# Patient Record
Sex: Female | Born: 1955 | Race: Black or African American | Hispanic: No | Marital: Single | Smoking: Never smoker
Health system: Southern US, Community
[De-identification: ages and names within clinical notes are randomized; demographics above are authoritative.]

## PROBLEM LIST (undated history)

## (undated) DIAGNOSIS — I10 Essential (primary) hypertension: Secondary | ICD-10-CM

## (undated) DIAGNOSIS — Z5189 Encounter for other specified aftercare: Secondary | ICD-10-CM

## (undated) DIAGNOSIS — M549 Dorsalgia, unspecified: Secondary | ICD-10-CM

## (undated) DIAGNOSIS — T7840XA Allergy, unspecified, initial encounter: Secondary | ICD-10-CM

## (undated) DIAGNOSIS — M1990 Unspecified osteoarthritis, unspecified site: Secondary | ICD-10-CM

## (undated) DIAGNOSIS — E119 Type 2 diabetes mellitus without complications: Secondary | ICD-10-CM

## (undated) DIAGNOSIS — E7800 Pure hypercholesterolemia, unspecified: Secondary | ICD-10-CM

## (undated) HISTORY — DX: Unspecified osteoarthritis, unspecified site: M19.90

## (undated) HISTORY — DX: Allergy, unspecified, initial encounter: T78.40XA

## (undated) HISTORY — DX: Encounter for other specified aftercare: Z51.89

---

## 1999-11-04 ENCOUNTER — Other Ambulatory Visit: Admission: RE | Admit: 1999-11-04 | Discharge: 1999-11-04 | Payer: Self-pay | Admitting: Obstetrics

## 1999-11-15 ENCOUNTER — Inpatient Hospital Stay (HOSPITAL_COMMUNITY): Admission: AD | Admit: 1999-11-15 | Discharge: 1999-11-15 | Payer: Self-pay | Admitting: Obstetrics and Gynecology

## 1999-11-15 ENCOUNTER — Encounter: Payer: Self-pay | Admitting: Obstetrics

## 2000-05-14 ENCOUNTER — Encounter: Payer: Self-pay | Admitting: Obstetrics & Gynecology

## 2000-05-14 ENCOUNTER — Inpatient Hospital Stay (HOSPITAL_COMMUNITY): Admission: AD | Admit: 2000-05-14 | Discharge: 2000-05-16 | Payer: Self-pay | Admitting: Obstetrics & Gynecology

## 2000-05-14 ENCOUNTER — Encounter (INDEPENDENT_AMBULATORY_CARE_PROVIDER_SITE_OTHER): Payer: Self-pay

## 2000-11-05 ENCOUNTER — Encounter: Admission: RE | Admit: 2000-11-05 | Discharge: 2000-11-05 | Payer: Self-pay | Admitting: Internal Medicine

## 2000-11-12 ENCOUNTER — Encounter: Admission: RE | Admit: 2000-11-12 | Discharge: 2000-11-12 | Payer: Self-pay | Admitting: Internal Medicine

## 2000-12-14 ENCOUNTER — Encounter: Payer: Self-pay | Admitting: Emergency Medicine

## 2000-12-14 ENCOUNTER — Emergency Department (HOSPITAL_COMMUNITY): Admission: EM | Admit: 2000-12-14 | Discharge: 2000-12-14 | Payer: Self-pay | Admitting: Emergency Medicine

## 2001-09-09 ENCOUNTER — Emergency Department (HOSPITAL_COMMUNITY): Admission: EM | Admit: 2001-09-09 | Discharge: 2001-09-09 | Payer: Self-pay | Admitting: Emergency Medicine

## 2001-09-10 ENCOUNTER — Encounter: Payer: Self-pay | Admitting: Emergency Medicine

## 2004-12-09 ENCOUNTER — Emergency Department (HOSPITAL_COMMUNITY): Admission: EM | Admit: 2004-12-09 | Discharge: 2004-12-09 | Payer: Self-pay | Admitting: Emergency Medicine

## 2004-12-21 ENCOUNTER — Emergency Department (HOSPITAL_COMMUNITY): Admission: EM | Admit: 2004-12-21 | Discharge: 2004-12-21 | Payer: Self-pay | Admitting: Emergency Medicine

## 2006-05-03 ENCOUNTER — Emergency Department (HOSPITAL_COMMUNITY): Admission: EM | Admit: 2006-05-03 | Discharge: 2006-05-03 | Payer: Self-pay | Admitting: Emergency Medicine

## 2008-10-15 ENCOUNTER — Emergency Department (HOSPITAL_COMMUNITY): Admission: EM | Admit: 2008-10-15 | Discharge: 2008-10-15 | Payer: Self-pay | Admitting: Emergency Medicine

## 2009-12-05 ENCOUNTER — Encounter (INDEPENDENT_AMBULATORY_CARE_PROVIDER_SITE_OTHER): Payer: Self-pay | Admitting: Family Medicine

## 2009-12-05 ENCOUNTER — Ambulatory Visit: Payer: Self-pay | Admitting: Family Medicine

## 2009-12-05 ENCOUNTER — Encounter: Payer: Self-pay | Admitting: Cardiovascular Disease

## 2009-12-05 LAB — CONVERTED CEMR LAB
AST: 26 units/L (ref 0–37)
Alkaline Phosphatase: 54 units/L (ref 39–117)
BUN: 16 mg/dL (ref 6–23)
Basophils Absolute: 0 10*3/uL (ref 0.0–0.1)
Basophils Relative: 1 % (ref 0–1)
Calcium: 9.9 mg/dL (ref 8.4–10.5)
Creatinine, Ser: 0.73 mg/dL (ref 0.40–1.20)
Eosinophils Absolute: 0.1 10*3/uL (ref 0.0–0.7)
Eosinophils Relative: 3 % (ref 0–5)
Glucose, Bld: 87 mg/dL (ref 70–99)
HCT: 39.2 % (ref 36.0–46.0)
MCHC: 33.4 g/dL (ref 30.0–36.0)
MCV: 90.7 fL (ref 78.0–100.0)
Platelets: 211 10*3/uL (ref 150–400)
RDW: 15.3 % (ref 11.5–15.5)
Vit D, 25-Hydroxy: 18 ng/mL — ABNORMAL LOW (ref 30–89)

## 2009-12-23 ENCOUNTER — Ambulatory Visit: Payer: Self-pay | Admitting: Internal Medicine

## 2009-12-23 ENCOUNTER — Encounter (INDEPENDENT_AMBULATORY_CARE_PROVIDER_SITE_OTHER): Payer: Self-pay | Admitting: Family Medicine

## 2009-12-23 LAB — CONVERTED CEMR LAB
Cholesterol: 232 mg/dL — ABNORMAL HIGH (ref 0–200)
HDL: 54 mg/dL (ref >39)
LDL Cholesterol: 146 mg/dL — ABNORMAL HIGH (ref 0–99)
Triglycerides: 161 mg/dL — ABNORMAL HIGH (ref <150)
VLDL: 32 mg/dL (ref 0–40)

## 2010-01-03 ENCOUNTER — Encounter: Payer: Self-pay | Admitting: Cardiovascular Disease

## 2010-01-03 ENCOUNTER — Encounter (INDEPENDENT_AMBULATORY_CARE_PROVIDER_SITE_OTHER): Payer: Self-pay | Admitting: Family Medicine

## 2010-01-03 ENCOUNTER — Ambulatory Visit: Payer: Self-pay | Admitting: Internal Medicine

## 2010-01-03 LAB — CONVERTED CEMR LAB
AST: 31 units/L (ref 0–37)
Albumin: 4.2 g/dL (ref 3.5–5.2)
Alkaline Phosphatase: 60 units/L (ref 39–117)
BUN: 9 mg/dL (ref 6–23)
Basophils Absolute: 0 10*3/uL (ref 0.0–0.1)
Basophils Relative: 1 % (ref 0–1)
Creatinine, Ser: 0.46 mg/dL (ref 0.40–1.20)
Eosinophils Relative: 1 % (ref 0–5)
Glucose, Bld: 91 mg/dL (ref 70–99)
HCT: 38.1 % (ref 36.0–46.0)
Lymphocytes Relative: 36 % (ref 12–46)
MCHC: 33.1 g/dL (ref 30.0–36.0)
Microalb, Ur: 39.05 mg/dL — ABNORMAL HIGH (ref 0.00–1.89)
Monocytes Absolute: 0.2 10*3/uL (ref 0.1–1.0)
Platelets: 187 10*3/uL (ref 150–400)
Potassium: 3.4 meq/L — ABNORMAL LOW (ref 3.5–5.3)
RDW: 14.6 % (ref 11.5–15.5)
Total Bilirubin: 1 mg/dL (ref 0.3–1.2)

## 2010-01-06 ENCOUNTER — Ambulatory Visit: Payer: Self-pay | Admitting: Internal Medicine

## 2010-01-31 ENCOUNTER — Encounter (INDEPENDENT_AMBULATORY_CARE_PROVIDER_SITE_OTHER): Payer: Self-pay | Admitting: *Deleted

## 2010-02-20 ENCOUNTER — Ambulatory Visit: Payer: Self-pay | Admitting: Internal Medicine

## 2010-02-27 ENCOUNTER — Ambulatory Visit: Payer: Self-pay | Admitting: Cardiovascular Disease

## 2010-02-27 DIAGNOSIS — I160 Hypertensive urgency: Secondary | ICD-10-CM | POA: Insufficient documentation

## 2010-02-27 DIAGNOSIS — R9431 Abnormal electrocardiogram [ECG] [EKG]: Secondary | ICD-10-CM | POA: Insufficient documentation

## 2010-02-27 DIAGNOSIS — I10 Essential (primary) hypertension: Secondary | ICD-10-CM

## 2010-02-27 DIAGNOSIS — R079 Chest pain, unspecified: Secondary | ICD-10-CM

## 2010-02-28 ENCOUNTER — Ambulatory Visit (HOSPITAL_COMMUNITY): Admission: RE | Admit: 2010-02-28 | Discharge: 2010-02-28 | Payer: Self-pay | Admitting: Family Medicine

## 2010-03-31 ENCOUNTER — Ambulatory Visit: Payer: Self-pay

## 2010-03-31 ENCOUNTER — Ambulatory Visit (HOSPITAL_COMMUNITY): Admission: RE | Admit: 2010-03-31 | Discharge: 2010-03-31 | Payer: Self-pay | Admitting: Cardiovascular Disease

## 2010-03-31 ENCOUNTER — Encounter: Payer: Self-pay | Admitting: Cardiovascular Disease

## 2010-03-31 ENCOUNTER — Ambulatory Visit: Payer: Self-pay | Admitting: Cardiology

## 2010-07-04 ENCOUNTER — Encounter (INDEPENDENT_AMBULATORY_CARE_PROVIDER_SITE_OTHER): Payer: Self-pay | Admitting: *Deleted

## 2010-07-04 LAB — CONVERTED CEMR LAB
CO2: 27 meq/L (ref 19–32)
Calcium: 10 mg/dL (ref 8.4–10.5)
Chloride: 98 meq/L (ref 96–112)
Glucose, Bld: 83 mg/dL (ref 70–99)
Potassium: 3.7 meq/L (ref 3.5–5.3)
Sodium: 140 meq/L (ref 135–145)

## 2010-10-07 NOTE — Consult Note (Signed)
Summary: [facility]  [facility]   Imported By: Marylou Mccoy 01/27/2010 15:48:16  _____________________________________________________________________  External Attachment:    Type:   Image     Comment:   External Document

## 2010-10-07 NOTE — Assessment & Plan Note (Signed)
Summary: Np3/htn,chest pains probable lvh on ekg-mb   Visit Type:  new pt visit  CC:  chest pain about 1 month ago...no other complaints today.  History of Present Illness: Emily Dickson is seen today at [facility] for SSCP and abnormal ECG with LVH.  CRF's include HTN on Rx with diazide.  Atypical pain since spring but declined cardiology referral initially  She has had some pain/pressure fleeting in her chest most days.  Pain lasts seconds.  Not asociated with SOB, palpitations, diaphoresis or syncope.  No agravating or alleviating factors.  Not related to food or exercise.  No recent trauma.  No history of sickle cell trait, connective tissue disease or trauma  Current Problems (verified): 1)  Electrocardiogram, Abnormal  (ICD-794.31) 2)  Essential Hypertension, Benign  (ICD-401.1) 3)  Chest Pain Unspecified  (ICD-786.50)  Current Medications (verified): 1)  Triamterene-Hctz 37.5-25 Mg Tabs (Triamterene-Hctz) .Marland Kitchen.. 1 Tab Once Daily 2)  Vitamin D (Ergocalciferol) 50000 Unit Caps (Ergocalciferol) .Marland Kitchen.. 1 Tab Weekly 3)  Multivitamins   Tabs (Multiple Vitamin) .Marland Kitchen.. 1 Tab Once Daily  Allergies (verified): 1)  ! 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   Vital Signs:  Patient  profile:   55 year old female Height:      67 inches Weight:      212 pounds BMI:     33.32 Pulse rate:   70 / minute Pulse rhythm:   irregular BP sitting:   138 / 80  (left arm) Cuff size:   large  Vitals Entered By: Danielle Rankin, CMA (February 27, 2010 4:01 PM)  Physical Exam  General:  Affect appropriate Healthy:  appears stated age HEENT: normal Neck supple with no adenopathy JVP normal no bruits no thyromegaly Lungs clear with no wheezing and good diaphragmatic motion Heart:  S1/S2 no murmur,rub, gallop or click PMI normal Abdomen: benighn, BS positve, no tenderness, no AAA no bruit.  No HSM or HJR Distal pulses intact with no bruits No edema Neuro non-focal Skin warm and dry    Impression & Recommendations:  Problem # 1:  CHEST PAIN UNSPECIFIED (ICD-786.50) Atypical F/U stress echo Orders: Stress Echo (Stress Echo)  Problem # 2:  ESSENTIAL HYPERTENSION, BENIGN (ICD-401.1) Conintue diazide.  Consider adding ACE if systolic BP high with exercise Her updated medication list for this problem includes:    Triamterene-hctz 37.5-25 Mg Tabs (Triamterene-hctz) .Marland Kitchen... 1 tab once daily  Problem # 3:  ELECTROCARDIOGRAM, ABNORMAL (ICD-794.31) Borderline voltage for LVH in lateral leads.  Nonspecific finding.  Assess LVH and R/O ischemia with stress echo  Patient Instructions: 1)  Your physician recommends that you schedule a follow-up appointment in: AS NEEDED PENDING TEST RESULTS 2)  Your physician has requested that you have a stress echocardiogram. For further information please visit [email or web address]  Please follow instruction sheet as given.   EKG Report  Procedure date:  02/27/2010  Findings:      NSR 70 Borderline voltage for LVH lateral leads

## 2010-10-07 NOTE — Letter (Signed)
Summary: Appointment - Missed  [facility], Main Office  [address]   [place], [place] [address]   Phone: [phone]  Fax: [phone]     Jan 31, 2010 MRN: [id number]   Emily Dickson [address] [place], [place]  [address]   Dear [name],  Our records indicate you missed your appointment on 01/28/2010 with Dr. Eden Emms. It is very important that we reach you to reschedule this appointment. We look forward to participating in your health care needs. Please contact us at the number listed above at your earliest convenience to reschedule this appointment.     Sincerely,   Migdalia Dk [facility] Scheduling Team

## 2010-10-07 NOTE — Consult Note (Signed)
[facility]: [facility]   Imported By: Marylou Mccoy 01/27/2010 15:27:58  _____________________________________________________________________  External Attachment:    Type:   Image     Comment:   External Document

## 2010-12-16 ENCOUNTER — Other Ambulatory Visit: Payer: Self-pay | Admitting: Family Medicine

## 2011-01-23 NOTE — Discharge Summary (Signed)
[facility]  Patient:    Emily Dickson, Emily Dickson                    MRN: [id number] Adm. Date:  [id number] Disc. Date: [id number] Attending:  Antionette Char Dictator:   Gwenlyn Perking, M.D.                           Discharge Summary  ADMITTING DIAGNOSIS:          Retained products of conception.  DISCHARGE DIAGNOSIS:          Retained products of conception, resolved.  PROCEDURES:                   Dilation and curettage, ultrasound guided.  HOSPITAL COURSE:              The patient was admitted on May 14, 2000, with significant postpartum hemorrhage.  An ultrasound evaluation revealed large amount of heterogenous material in the lower uterine segment, and retained products of conception were suspected.  Given these findings, the patient was taken to the operating room secondary to postpartum hemorrhage to rule out retained products.  A D&C was performed, ultrasound guided, by Dr. Tamela Oddi.  Pathology report of the products did reveal a small piece of placenta.  After leaving the operating room, the patients hemoglobin was serially checked which came to a nadir of 4.9, at which point the patient was transfused two units of packed red blood cells.  Thereafter, a hemoglobin was again serially checked and on May 16, 2000, hemoglobin was stable at 6.6. The patient remained asymptomatic and on May 16, 2000, it was decided that the patient had benefited maximally from this hospital admission and could be discharged home safely on iron replacement 325 mg 1 p.o. t.i.d. as well as Colace.  CONDITION ON DISCHARGE:       Stable.  DISPOSITION:                  Discharged patient to home.  DISCHARGE MEDICATIONS:        1. Iron sulfate 325 mg 1 p.o. t.i.d.                               2. Colace 100 mg p.o. b.i.d.  DISCHARGE INSTRUCTIONS:       Activity as tolerated.  Diet as tolerated. Symptoms that warrant further treatment - should the  patients and/or should dizziness develop, the patient is to come back to maternity admission unit for reevaluation and treatment.  FOLLOW-UP:                    The patient is to follow-up with Dr. Tamela Oddi in her clinic in one week. DD:  05/16/00 TD:  05/17/00 Job: [id number]

## 2011-01-23 NOTE — Op Note (Signed)
[facility]  Patient:    Emily Dickson, Emily Dickson                    MRN: [id number] Proc. Date: 05/14/00 Adm. Date:  [id number] Disc. Date: [id number] Attending:  Antionette Char CC:         [facility]   Operative Report  PREOPERATIVE DIAGNOSIS:       Postpartum hemorrhage, rule out retained                               products of conception.  POSTOPERATIVE DIAGNOSIS:      Postpartum hemorrhage, rule out retained                               products of conception.  OPERATION/PROCEDURE:          Dilatation and curettage with ultrasound guidance.  SURGEON:                      Roseanna Rainbow, M.D.  ANESTHESIA:                   Paracervical block, managed anesthesia care.  COMPLICATIONS:                None.  ESTIMATED BLOOD LOSS:         Estimated blood loss was 50 cc.  FINDINGS:                     The cervix was approximately 2-3 cm dilated. There was a 2 x 2 cm fragment of placenta that was retrieved manually from the lower uterine segment and this was surrounded by a fairly large amount of clot.  The total amount of clot retrieved during the procedure was approximately 300 cc.  After sharp curettage was performed the bleeding was felt to be minimal.  Ultrasound was consistent with evacuation of the clot and retained products of conception from the lower uterine segment.  DESCRIPTION OF PROCEDURE:     The patient was taken to the operating room and placed in the dorsal lithotomy position and prepped and draped in the usual sterile fashion.  A sterile speculum was placed in the vagina and the cervix noted to be dilated with clot filling the posterior fornix.  The cervix and vagina were swabbed with Betadine as the clot was retrieved from the posterior fornix.  Lidocaine 1% 5 cc were injected into the anterior cervix.  An empty ring spongestick was then applied to this location.  Sharp curettage was  then performed.  The instruments were removed from the vagina and attempt was made to manually retrieve the retained products of conception.  The instruments were then replaced and sharp curettage performed with ultrasound guidance. After curettage minimal bleeding was noted and the empty ring spongestick was removed from the anterior lip of the cervix.  The patient tolerated the procedure well and was taken to the PACU in guarded condition.  Pathology included portion of placenta and blood clot. DD:  05/16/00 TD:  05/17/00 Job: [id number]

## 2011-01-28 ENCOUNTER — Other Ambulatory Visit (HOSPITAL_COMMUNITY): Payer: Self-pay | Admitting: Family Medicine

## 2011-01-28 DIAGNOSIS — Z1231 Encounter for screening mammogram for malignant neoplasm of breast: Secondary | ICD-10-CM

## 2011-03-02 ENCOUNTER — Ambulatory Visit (HOSPITAL_COMMUNITY): Payer: Self-pay

## 2011-03-03 ENCOUNTER — Ambulatory Visit (HOSPITAL_COMMUNITY)
Admission: RE | Admit: 2011-03-03 | Discharge: 2011-03-03 | Disposition: A | Discharge status: Home / Self Care | Payer: Self-pay | Source: Ambulatory Visit | Attending: Family Medicine | Admitting: Family Medicine

## 2011-03-03 DIAGNOSIS — Z1231 Encounter for screening mammogram for malignant neoplasm of breast: Secondary | ICD-10-CM | POA: Insufficient documentation

## 2011-12-23 ENCOUNTER — Emergency Department (INDEPENDENT_AMBULATORY_CARE_PROVIDER_SITE_OTHER)
Admission: EM | Admit: 2011-12-23 | Discharge: 2011-12-23 | Disposition: A | Discharge status: Home / Self Care | Payer: Self-pay | Source: Home / Self Care | Attending: Family Medicine | Admitting: Family Medicine

## 2011-12-23 ENCOUNTER — Emergency Department (HOSPITAL_COMMUNITY): Payer: Self-pay

## 2011-12-23 ENCOUNTER — Emergency Department (HOSPITAL_COMMUNITY)
Admission: EM | Admit: 2011-12-23 | Discharge: 2011-12-23 | Disposition: A | Discharge status: Home / Self Care | Payer: Self-pay | Attending: Emergency Medicine | Admitting: Emergency Medicine

## 2011-12-23 ENCOUNTER — Encounter (HOSPITAL_COMMUNITY): Payer: Self-pay | Admitting: Emergency Medicine

## 2011-12-23 ENCOUNTER — Encounter (HOSPITAL_COMMUNITY): Payer: Self-pay | Admitting: *Deleted

## 2011-12-23 DIAGNOSIS — R112 Nausea with vomiting, unspecified: Secondary | ICD-10-CM | POA: Insufficient documentation

## 2011-12-23 DIAGNOSIS — A09 Infectious gastroenteritis and colitis, unspecified: Secondary | ICD-10-CM

## 2011-12-23 DIAGNOSIS — Z79899 Other long term (current) drug therapy: Secondary | ICD-10-CM | POA: Insufficient documentation

## 2011-12-23 DIAGNOSIS — R1110 Vomiting, unspecified: Secondary | ICD-10-CM

## 2011-12-23 DIAGNOSIS — R109 Unspecified abdominal pain: Secondary | ICD-10-CM | POA: Insufficient documentation

## 2011-12-23 DIAGNOSIS — N390 Urinary tract infection, site not specified: Secondary | ICD-10-CM | POA: Insufficient documentation

## 2011-12-23 DIAGNOSIS — E871 Hypo-osmolality and hyponatremia: Secondary | ICD-10-CM

## 2011-12-23 DIAGNOSIS — R000 Tachycardia, unspecified: Secondary | ICD-10-CM | POA: Insufficient documentation

## 2011-12-23 DIAGNOSIS — I10 Essential (primary) hypertension: Secondary | ICD-10-CM | POA: Insufficient documentation

## 2011-12-23 DIAGNOSIS — A084 Viral intestinal infection, unspecified: Secondary | ICD-10-CM

## 2011-12-23 DIAGNOSIS — IMO0001 Reserved for inherently not codable concepts without codable children: Secondary | ICD-10-CM | POA: Insufficient documentation

## 2011-12-23 HISTORY — DX: Essential (primary) hypertension: I10

## 2011-12-23 LAB — URINE MICROSCOPIC-ADD ON

## 2011-12-23 LAB — POCT I-STAT, CHEM 8
BUN: 10 mg/dL (ref 6–23)
Calcium, Ion: 0.66 mmol/L — CL (ref 1.12–1.32)
Chloride: 94 mEq/L — ABNORMAL LOW (ref 96–112)
Creatinine, Ser: 0.9 mg/dL (ref 0.50–1.10)
Glucose, Bld: 177 mg/dL — ABNORMAL HIGH (ref 70–99)
HCT: 47 % — ABNORMAL HIGH (ref 36.0–46.0)
Potassium: 3.1 mEq/L — ABNORMAL LOW (ref 3.5–5.1)

## 2011-12-23 LAB — HEPATIC FUNCTION PANEL
ALT: 41 U/L — ABNORMAL HIGH (ref 0–35)
AST: 67 U/L — ABNORMAL HIGH (ref 0–37)
Albumin: 2.5 g/dL — ABNORMAL LOW (ref 3.5–5.2)
Bilirubin, Direct: 0.3 mg/dL (ref 0.0–0.3)
Total Bilirubin: 1.1 mg/dL (ref 0.3–1.2)

## 2011-12-23 LAB — CBC
Hemoglobin: 14.6 g/dL (ref 12.0–15.0)
MCH: 31.3 pg (ref 26.0–34.0)
MCHC: 36 g/dL (ref 30.0–36.0)
MCV: 86.7 fL (ref 78.0–100.0)
Platelets: 101 10*3/uL — ABNORMAL LOW (ref 150–400)
RBC: 4.67 MIL/uL (ref 3.87–5.11)

## 2011-12-23 LAB — DIFFERENTIAL
Basophils Relative: 0 % (ref 0–1)
Eosinophils Absolute: 0 10*3/uL (ref 0.0–0.7)
Eosinophils Relative: 0 % (ref 0–5)
Lymphs Abs: 0.8 10*3/uL (ref 0.7–4.0)
Monocytes Relative: 4 % (ref 3–12)

## 2011-12-23 LAB — URINALYSIS, ROUTINE W REFLEX MICROSCOPIC
Glucose, UA: 100 mg/dL — AB
Hgb urine dipstick: NEGATIVE
Ketones, ur: 15 mg/dL — AB
Protein, ur: 100 mg/dL — AB
Urobilinogen, UA: 1 mg/dL (ref 0.0–1.0)

## 2011-12-23 MED ORDER — ONDANSETRON HCL 4 MG PO TABS: 4.0000 mg | ORAL_TABLET | Freq: Four times a day (QID) | ORAL | Status: DC

## 2011-12-23 MED ORDER — ALBUTEROL SULFATE (5 MG/ML) 0.5% IN NEBU
INHALATION_SOLUTION | RESPIRATORY_TRACT | Status: AC
  Filled 2011-12-23: qty 1

## 2011-12-23 MED ORDER — ACETAMINOPHEN 325 MG PO TABS
ORAL_TABLET | ORAL | Status: AC
  Administered 2011-12-23: 650 mg
  Filled 2011-12-23: qty 2

## 2011-12-23 MED ORDER — ONDANSETRON HCL 4 MG/2ML IJ SOLN
4.0000 mg | Freq: Once | INTRAMUSCULAR | Status: AC
  Administered 2011-12-23: 4 mg via INTRAVENOUS
  Filled 2011-12-23: qty 2

## 2011-12-23 MED ORDER — SODIUM CHLORIDE 0.9 % IV BOLUS (SEPSIS)
1000.0000 mL | Freq: Once | INTRAVENOUS | Status: AC
  Administered 2011-12-23: 1000 mL via INTRAVENOUS

## 2011-12-23 MED ORDER — SULFAMETHOXAZOLE-TMP DS 800-160 MG PO TABS
1.0000 | ORAL_TABLET | Freq: Once | ORAL | Status: AC
  Administered 2011-12-23: 1 via ORAL
  Filled 2011-12-23: qty 1

## 2011-12-23 MED ORDER — ONDANSETRON 4 MG PO TBDP
ORAL_TABLET | ORAL | Status: AC
  Filled 2011-12-23: qty 1

## 2011-12-23 MED ORDER — SULFAMETHOXAZOLE-TRIMETHOPRIM 800-160 MG PO TABS: 1.0000 | ORAL_TABLET | Freq: Two times a day (BID) | ORAL | Status: DC

## 2011-12-23 MED ORDER — ONDANSETRON 4 MG PO TBDP
4.0000 mg | ORAL_TABLET | Freq: Once | ORAL | Status: AC
  Administered 2011-12-23: 4 mg via ORAL

## 2011-12-23 MED ORDER — METOCLOPRAMIDE HCL 10 MG PO TABS: 10.0000 mg | ORAL_TABLET | Freq: Four times a day (QID) | ORAL | Status: DC

## 2011-12-23 MED ORDER — HYDROMORPHONE HCL PF 1 MG/ML IJ SOLN
0.5000 mg | Freq: Once | INTRAMUSCULAR | Status: AC
  Administered 2011-12-23: 0.5 mg via INTRAVENOUS
  Filled 2011-12-23: qty 1

## 2011-12-23 NOTE — ED Notes (Signed)
To ED for eval of abnormal labs resulted by ucc. Pt states she has been vomiting since yesterday. Potassium 3.1, sodium 128,  and glucose 177. Pt states she feels weak. No cp. No sob. No vomiting at present

## 2011-12-23 NOTE — Discharge Instructions (Signed)
FOLLOW UP WITH DR. SHAW FOR RECHECK IF SYMPTOMS PERSIST. RETURN HERE WITH ANY HIGH FEVER, SEVERE PAIN OR NEW CONCERN. TAKE SEPTRA FOR URINARY TRACT INFECTION AND REGLAN FOR NAUSEA IF NEEDED. PUSH FLUIDS.   Urinary Tract Infection Infections of the urinary tract can start in several places. A bladder infection (cystitis), a kidney infection (pyelonephritis), and a prostate infection (prostatitis) are different types of urinary tract infections (UTIs). They usually get better if treated with medicines (antibiotics) that kill germs. Take all the medicine until it is gone. You or your child may feel better in a few days, but TAKE ALL MEDICINE or the infection may not respond and may become more difficult to treat. HOME CARE INSTRUCTIONS   Drink enough water and fluids to keep the urine clear or pale yellow. Cranberry juice is especially recommended, in addition to large amounts of water.   Avoid caffeine, tea, and carbonated beverages. They tend to irritate the bladder.   Alcohol may irritate the prostate.   Only take over-the-counter or prescription medicines for pain, discomfort, or fever as directed by your caregiver.  To prevent further infections:  Empty the bladder often. Avoid holding urine for long periods of time.   After a bowel movement, women should cleanse from front to back. Use each tissue only once.   Empty the bladder before and after sexual intercourse.  FINDING OUT THE RESULTS OF YOUR TEST Not all test results are available during your visit. If your or your child's test results are not back during the visit, make an appointment with your caregiver to find out the results. Do not assume everything is normal if you have not heard from your caregiver or the medical facility. It is important for you to follow up on all test results. SEEK MEDICAL CARE IF:   There is back pain.   Your baby is older than 3 months with a rectal temperature of 100.5 F (38.1 C) or higher for more  than 1 day.   Your or your child's problems (symptoms) are no better in 3 days. Return sooner if you or your child is getting worse.  SEEK IMMEDIATE MEDICAL CARE IF:   There is severe back pain or lower abdominal pain.   You or your child develops chills.   You have a fever.   Your baby is older than 3 months with a rectal temperature of 102 F (38.9 C) or higher.   Your baby is 15 months old or younger with a rectal temperature of 100.4 F (38 C) or higher.   There is nausea or vomiting.   There is continued burning or discomfort with urination.  MAKE SURE YOU:   Understand these instructions.   Will watch your condition.   Will get help right away if you are not doing well or get worse.  Document Released: 06/03/2005 Document Revised: 08/13/2011 Document Reviewed: 01/06/2007 [facility] Patient Information 2012 [place], [place].

## 2011-12-23 NOTE — ED Notes (Signed)
States, "feels better, ready to go, I am hungry".

## 2011-12-23 NOTE — ED Notes (Signed)
Pt to xray, alert, NAD, calm, interactive

## 2011-12-23 NOTE — ED Provider Notes (Signed)
History     CSN: [id number]  Arrival date & time 12/23/11  1805   First MD Initiated Contact with Patient 12/23/11 1910      Chief Complaint  Patient presents with  . Abnormal Lab    (Consider location/radiation/quality/duration/timing/severity/associated sxs/prior treatment) Patient is a 56 y.o. female presenting with vomiting. The history is provided by the patient and a relative.  Emesis  This is a new problem. The current episode started 2 days ago. Episode frequency: She vomiting twice yesterday and has not vomited today. Nausea is persistent. The problem has been gradually worsening. There has been no fever. Associated symptoms include abdominal pain and myalgias. Pertinent negatives include no chills, no diarrhea and no fever.    Past Medical History  Diagnosis Date  . Hypertension     History reviewed. No pertinent past surgical history.  History reviewed. No pertinent family history.  History  Substance Use Topics  . Smoking status: Never Smoker   . Smokeless tobacco: Not on file  . Alcohol Use: No    OB History    Grav Para Term Preterm Abortions TAB SAB Ect Mult Living                  Review of Systems  Constitutional: Negative for fever and chills.  Respiratory: Negative.   Cardiovascular: Negative.   Gastrointestinal: Positive for nausea, vomiting and abdominal pain. Negative for diarrhea and blood in stool.  Genitourinary: Negative for dysuria.  Musculoskeletal: Positive for myalgias.  Skin: Negative.  Negative for rash.  Neurological: Negative.  Negative for dizziness and light-headedness.    Allergies  Penicillins  Home Medications   Current Outpatient Rx  Name Route Sig Dispense Refill  . HYDROCHLOROTHIAZIDE 25 MG PO TABS Oral Take 25 mg by mouth daily.      BP 107/73  Pulse 118  Temp(Src) 99.3 F (37.4 C) (Oral)  Resp 16  SpO2 100%  Physical Exam  Constitutional: She appears well-developed and well-nourished.  HENT:  Head:  Normocephalic.  Neck: Normal range of motion. Neck supple.  Cardiovascular: Regular rhythm.  Tachycardia present.   Pulmonary/Chest: Effort normal and breath sounds normal.  Abdominal: Soft. Bowel sounds are normal. There is tenderness. There is no rebound and no guarding.       Tenderness is diffuse.   Musculoskeletal: Normal range of motion. She exhibits no edema.  Neurological: She is alert.  Skin: Skin is warm and dry. No rash noted.  Psychiatric: She has a normal mood and affect.    ED Course  Procedures (including critical care time)   Results for orders placed during the hospital encounter of 12/23/11  HEPATIC FUNCTION PANEL      Component Value Range   Total Protein 6.9  6.0 - 8.3 (g/dL)   Albumin 2.5 (*) 3.5 - 5.2 (g/dL)   AST 67 (*) 0 - 37 (U/L)   ALT 41 (*) 0 - 35 (U/L)   Alkaline Phosphatase 77  39 - 117 (U/L)   Total Bilirubin 1.1  0.3 - 1.2 (mg/dL)   Bilirubin, Direct 0.3  0.0 - 0.3 (mg/dL)   Indirect Bilirubin 0.8  0.3 - 0.9 (mg/dL)  CBC      Component Value Range   WBC 6.5  4.0 - 10.5 (K/uL)   RBC 4.67  3.87 - 5.11 (MIL/uL)   Hemoglobin 14.6  12.0 - 15.0 (g/dL)   HCT 10.2  72.5 - 36.6 (%)   MCV 86.7  78.0 - 100.0 (fL)  MCH 31.3  26.0 - 34.0 (pg)   MCHC 36.0  30.0 - 36.0 (g/dL)   RDW 16.1 (*) 09.6 - 15.5 (%)   Platelets 101 (*) 150 - 400 (K/uL)  DIFFERENTIAL      Component Value Range   Neutrophils Relative 83 (*) 43 - 77 (%)   Neutro Abs 5.4  1.7 - 7.7 (K/uL)   Lymphocytes Relative 13  12 - 46 (%)   Lymphs Abs 0.8  0.7 - 4.0 (K/uL)   Monocytes Relative 4  3 - 12 (%)   Monocytes Absolute 0.3  0.1 - 1.0 (K/uL)   Eosinophils Relative 0  0 - 5 (%)   Eosinophils Absolute 0.0  0.0 - 0.7 (K/uL)   Basophils Relative 0  0 - 1 (%)   Basophils Absolute 0.0  0.0 - 0.1 (K/uL)  URINALYSIS, ROUTINE W REFLEX MICROSCOPIC      Component Value Range   Color, Urine AMBER (*) YELLOW    APPearance TURBID (*) CLEAR    Specific Gravity, Urine 1.028  1.005 - 1.030    pH  6.0  5.0 - 8.0    Glucose, UA 100 (*) NEGATIVE (mg/dL)   Hgb urine dipstick NEGATIVE  NEGATIVE    Bilirubin Urine SMALL (*) NEGATIVE    Ketones, ur 15 (*) NEGATIVE (mg/dL)   Protein, ur 045 (*) NEGATIVE (mg/dL)   Urobilinogen, UA 1.0  0.0 - 1.0 (mg/dL)   Nitrite POSITIVE (*) NEGATIVE    Leukocytes, UA SMALL (*) NEGATIVE   URINE MICROSCOPIC-ADD ON      Component Value Range   Squamous Epithelial / LPF FEW (*) RARE    WBC, UA 3-6  <3 (WBC/hpf)   Bacteria, UA FEW (*) RARE    Casts HYALINE CASTS (*) NEGATIVE    Urine-Other MUCOUS PRESENT      Dg Abd Acute W/chest  12/23/2011  *RADIOLOGY REPORT*  Clinical Data: Abdominal pain, vomiting, diarrhea no evidence of acute abnormality.  ACUTE ABDOMEN SERIES (ABDOMEN 2 VIEW & CHEST 1 VIEW)  Comparison:  Chest x-ray dated October 15, 2008   Findings:  The cardiac silhouette, mediastinum, pulmonary vasculature are within normal limits.  Both lungs are clear.  The stool and bowel gas pattern is within normal limits with no evidence of obstruction.  No pneumoperitoneum.  There is no gross organomegaly.  No abnormal calcifications are seen over the abdomen or pelvis.  Degenerative changes are present within the axial spine. There is no acute bony abnormality.  IMPRESSION: There is no evidence of acute abnormality.  Original Report Authenticated By: Brandon Melnick, M.D.     No diagnosis found.  1. UTI   MDM  She is feeling better after IV fluids. Tachycardia resolved. She has urine that is nitrite positive and will treat. No abdominal pain on re-evaluation. Feel she can be discharged home.        Rodena Medin, PA-C 12/23/11 2150

## 2011-12-23 NOTE — ED Notes (Addendum)
Pt complains of fingers tingling, cramping, stiffening, and hurting. No chest pain or SOB. Her feet and stomach are hurting, too. Medical history of HTN. Allergic to Penicillin. Abdominal pain all over. Vomited three times last night with no diarrhea. She hasn't been eating much for 2 days. She takes blood pressure medication. Facial discoloration around her lips and nose that started a week ago. Skin is pink from scratching.

## 2011-12-23 NOTE — ED Notes (Signed)
Pt c/o vomiting last night that has improved today. She has nausea and new onset of numbness in hands this morning. She also has a new numbness in her feet.

## 2011-12-23 NOTE — ED Provider Notes (Signed)
Medical screening examination/treatment/procedure(s) were performed by non-physician practitioner and as supervising physician I was immediately available for consultation/collaboration.   Loren Racer, MD 12/23/11 2249

## 2011-12-23 NOTE — ED Provider Notes (Signed)
History     CSN: [id number]  Arrival date & time 12/23/11  1531   First MD Initiated Contact with Patient 12/23/11 1612      Chief Complaint  Patient presents with  . Numbness    (Consider location/radiation/quality/duration/timing/severity/associated sxs/prior treatment) Patient is a 56 y.o. female presenting with vomiting. The history is provided by the patient.  Emesis  This is a new problem. The current episode started yesterday (felt fine yest until onset of nausea then v/d last eve.). The problem occurs 2 to 4 times per day. The problem has been gradually improving (no vomiting today.has noticed cramping and tingling in hands and feet since onset.). The emesis has an appearance of stomach contents. There has been no fever. Associated symptoms include abdominal pain and diarrhea. Pertinent negatives include no chills, no cough, no fever, no headaches and no URI.    Past Medical History  Diagnosis Date  . Hypertension     History reviewed. No pertinent past surgical history.  History reviewed. No pertinent family history.  History  Substance Use Topics  . Smoking status: Never Smoker   . Smokeless tobacco: Not on file  . Alcohol Use: No    OB History    Grav Para Term Preterm Abortions TAB SAB Ect Mult Living                  Review of Systems  Constitutional: Negative for fever, chills and appetite change.  HENT: Negative.   Respiratory: Negative for cough.   Gastrointestinal: Positive for nausea, vomiting, abdominal pain and diarrhea. Negative for blood in stool and abdominal distention.  Genitourinary: Negative.   Musculoskeletal: Negative for back pain.  Neurological: Positive for weakness and numbness. Negative for headaches.    Allergies  Penicillins  Home Medications   Current Outpatient Rx  Name Route Sig Dispense Refill  . HYDROCHLOROTHIAZIDE 25 MG PO TABS Oral Take 25 mg by mouth daily.    Marland Kitchen ONDANSETRON HCL 4 MG PO TABS Oral Take 1 tablet (4  mg total) by mouth every 6 (six) hours. As needed for n/v 6 tablet 0    BP 113/77  Pulse 118  Temp(Src) 99 F (37.2 C) (Oral)  Resp 18  SpO2 100%  Physical Exam  Nursing note and vitals reviewed. Constitutional: She is oriented to person, place, and time. She appears well-developed and well-nourished.  HENT:  Head: Normocephalic.  Right Ear: External ear normal.  Left Ear: External ear normal.  Nose: Nose normal.  Mouth/Throat: Oropharynx is clear and moist.  Eyes: Pupils are equal, round, and reactive to light.  Neck: Normal range of motion. Neck supple.  Cardiovascular: Normal rate, normal heart sounds and intact distal pulses.   Pulmonary/Chest: Effort normal and breath sounds normal.  Abdominal: Soft. Normal appearance. She exhibits no distension and no mass. Bowel sounds are decreased. There is no hepatosplenomegaly. There is tenderness in the epigastric area. There is no rigidity, no rebound, no guarding and no CVA tenderness. No hernia.  Musculoskeletal: She exhibits no edema and no tenderness.  Lymphadenopathy:    She has no cervical adenopathy.  Neurological: She is alert and oriented to person, place, and time.  Skin: Skin is warm and dry.    ED Course  Procedures (including critical care time)  Labs Reviewed  POCT I-STAT, CHEM 8 - Abnormal; Notable for the following:    Sodium 128 (*)    Potassium 3.1 (*)    Chloride 94 (*)    Glucose, Bld  177 (*)    Calcium, Ion 0.66 (*)    Hemoglobin 16.0 (*)    HCT 47.0 (*)    All other components within normal limits   No results found.   1. Gastroenteritis and colitis, viral   2. Vomiting   3. Hyponatremia   4. Hypocalcemia       MDM  abnl i- stat        Linna Hoff, MD 12/23/11 [phone]

## 2011-12-23 NOTE — ED Notes (Signed)
Patient transported to CT 

## 2011-12-23 NOTE — ED Notes (Signed)
Pt comes from [facility], c/o nausea, vomiting. Pt given zofran at [facility]. Sent here for evaluation due to potassium 3.1, glucose 177, Sodium 128. Pt reporting new onset numbness in feet and hands. No neuro deficits noted per Wilkie Aye, Charity fundraiser.Pt ambulated into ER independently.

## 2011-12-29 ENCOUNTER — Emergency Department (HOSPITAL_COMMUNITY)
Admission: EM | Admit: 2011-12-29 | Discharge: 2011-12-29 | Disposition: A | Discharge status: Home / Self Care | Payer: Self-pay | Attending: Emergency Medicine | Admitting: Emergency Medicine

## 2011-12-29 ENCOUNTER — Encounter (HOSPITAL_COMMUNITY): Payer: Self-pay | Admitting: *Deleted

## 2011-12-29 DIAGNOSIS — R197 Diarrhea, unspecified: Secondary | ICD-10-CM | POA: Insufficient documentation

## 2011-12-29 DIAGNOSIS — R5381 Other malaise: Secondary | ICD-10-CM | POA: Insufficient documentation

## 2011-12-29 DIAGNOSIS — E876 Hypokalemia: Secondary | ICD-10-CM | POA: Insufficient documentation

## 2011-12-29 DIAGNOSIS — R112 Nausea with vomiting, unspecified: Secondary | ICD-10-CM | POA: Insufficient documentation

## 2011-12-29 DIAGNOSIS — Z79899 Other long term (current) drug therapy: Secondary | ICD-10-CM | POA: Insufficient documentation

## 2011-12-29 DIAGNOSIS — I10 Essential (primary) hypertension: Secondary | ICD-10-CM | POA: Insufficient documentation

## 2011-12-29 LAB — CBC
HCT: 32.3 % — ABNORMAL LOW (ref 36.0–46.0)
MCH: 30.4 pg (ref 26.0–34.0)
MCHC: 34.7 g/dL (ref 30.0–36.0)
MCV: 87.8 fL (ref 78.0–100.0)
Platelets: 306 10*3/uL (ref 150–400)
RDW: 16 % — ABNORMAL HIGH (ref 11.5–15.5)
WBC: 6.3 10*3/uL (ref 4.0–10.5)

## 2011-12-29 LAB — BASIC METABOLIC PANEL
CO2: 31 mEq/L (ref 19–32)
Calcium: 10 mg/dL (ref 8.4–10.5)
GFR calc non Af Amer: >90 mL/min (ref >90)
Glucose, Bld: 142 mg/dL — ABNORMAL HIGH (ref 70–99)
Potassium: 2.2 mEq/L — CL (ref 3.5–5.1)
Sodium: 132 mEq/L — ABNORMAL LOW (ref 135–145)

## 2011-12-29 LAB — URINALYSIS, ROUTINE W REFLEX MICROSCOPIC
Glucose, UA: NEGATIVE mg/dL
Hgb urine dipstick: NEGATIVE
Leukocytes, UA: NEGATIVE
Protein, ur: NEGATIVE mg/dL
Specific Gravity, Urine: 1.003 — ABNORMAL LOW (ref 1.005–1.030)
Urobilinogen, UA: 0.2 mg/dL (ref 0.0–1.0)

## 2011-12-29 MED ORDER — SODIUM CHLORIDE 0.9 % IV SOLN
Freq: Once | INTRAVENOUS | Status: AC
  Administered 2011-12-29: 19:00:00 via INTRAVENOUS

## 2011-12-29 MED ORDER — SODIUM CHLORIDE 0.9 % IV BOLUS (SEPSIS)
1000.0000 mL | Freq: Once | INTRAVENOUS | Status: AC
  Administered 2011-12-29: 1000 mL via INTRAVENOUS

## 2011-12-29 MED ORDER — POTASSIUM CHLORIDE 10 MEQ/100ML IV SOLN
10.0000 meq | INTRAVENOUS | Status: AC
  Administered 2011-12-29 (×3): 10 meq via INTRAVENOUS
  Filled 2011-12-29 (×3): qty 100

## 2011-12-29 NOTE — Discharge Instructions (Signed)
Please read the information below.  Your potassium level was 2.2 today.  Take the potassium prescribed to you as directed.  Call your primary care provider for a close follow up appointment.  Read the information below and make sure you eat a healthy, balanced diet including foods rich in potassium.  You may return to the ER at any time for worsening condition or any new symptoms that concern you.     Hypokalemia Hypokalemia means a low potassium level in the blood.Potassium is an electrolyte that helps regulate the amount of fluid in the body. It also stimulates muscle contraction and maintains a stable acid-base balance.Most of the body's potassium is inside of cells, and only a very small amount is in the blood. Because the amount in the blood is so small, minor changes can have big effects. PREPARATION FOR TEST Testing for potassium requires taking a blood sample taken by needle from a vein in the arm. The skin is cleaned thoroughly before the sample is drawn. There is no other special preparation needed. NORMAL VALUES Potassium levels below 3.5 mEq/L are abnormally low. Levels above 5.1 mEq/L are abnormally high. Ranges for normal findings may vary among different laboratories and hospitals. You should always check with your doctor after having lab work or other tests done to discuss the meaning of your test results and whether your values are considered within normal limits. MEANING OF TEST  Your caregiver will go over the test results with you and discuss the importance and meaning of your results, as well as treatment options and the need for additional tests, if necessary. A potassium level is frequently part of a routine medical exam. It is usually included as part of a whole "panel" of tests for several blood salts (such as Sodium and Chloride). It may be done as part of follow-up when a low potassium level was found in the past or other blood salts are suspected of being out of balance. A  low potassium level might be suspected if you have one or more of the following:  Symptoms of weakness.   Abnormal heart rhythms.   High blood pressure and are taking medication to control this, especially water pills (diuretics).   Kidney disease that can affect your potassium level .   Diabetes requiring the use of insulin. The potassium may fall after taking insulin, especially if the diabetes had been out of control for a while.   A condition requiring the use of cortisone-type medication or certain types of antibiotics.   Vomiting and/or diarrhea for more than a day or two.   A stomach or intestinal condition that may not permit appropriate absorption of potassium.   Fainting episodes.   Mental confusion.  OBTAINING TEST RESULTS It is your responsibility to obtain your test results. Ask the lab or department performing the test when and how you will get your results.  Please contact your caregiver directly if you have not received the results within one week. At that time, ask if there is anything different or new you should be doing in relation to the results. TREATMENT Hypokalemia can be treated with potassium supplements taken by mouth and/or adjustments in your current medications. A diet high in potassium is also helpful. Foods with high potassium content are:  Peas, lentils, lima beans, nuts, and dried fruit.   Whole grain and bran cereals and breads.   Fresh fruit, vegetables (bananas, cantaloupe, grapefruit, oranges, tomatoes, honeydew melons, potatoes).   Orange and tomato juices.  Meats. If potassium supplement has been prescribed for you today or your medications have been adjusted, see your personal caregiver in time02 for a re-check.  SEEK MEDICAL CARE IF:  There is a feeling of worsening weakness.   You experience repeated chest palpitations.   You are diabetic and having difficulty keeping your blood sugars in the normal range.   You are  experiencing vomiting and/or diarrhea.   You are having difficulty with any of your regular medications.  SEEK IMMEDIATE MEDICAL CARE IF:  You experience chest pain, shortness of breath, or episodes of dizziness.   You have been having vomiting or diarrhea for more than 2 days.   You have a fainting episode.  MAKE SURE YOU:   Understand these instructions.   Will watch your condition.   Will get help right away if you are not doing well or get worse.  Document Released: 08/24/2005 Document Revised: 08/13/2011 Document Reviewed: 08/04/2008 [facility] Patient Information 2012 [place], [place].  Foods Rich in Potassium Food / Potassium (mg)  Apricots, dried,  cup / 378 mg   Apricots, raw, 1 cup halves / 401 mg   Avocado,  / 487 mg   Banana, 1 large / 487 mg   Beef, lean, round, 3 oz / 202 mg   Cantaloupe, 1 cup cubes / 427 mg   Dates, medjool, 5 whole / 835 mg   Ham, cured, 3 oz / 212 mg   Lentils, dried,  cup / 458 mg   Lima beans, frozen,  cup / 258 mg   Orange, 1 large / 333 mg   Orange juice, 1 cup / 443 mg   Peaches, dried,  cup / 398 mg   Peas, split, cooked,  cup / 355 mg   Potato, boiled, 1 medium / 515 mg   Prunes, dried, uncooked,  cup / 318 mg   Raisins,  cup / 309 mg   Salmon, pink, raw, 3 oz / 275 mg   Sardines, canned , 3 oz / 338 mg   Tomato, raw, 1 medium / 292 mg   Tomato juice, 6 oz / 417 mg   [place], 3 oz / 349 mg  Document Released: 08/24/2005 Document Revised: 05/06/2011 Document Reviewed: 01/07/2009 [facility] Patient Information 2012 [place], [place].Foods Rich in Potassium Food / Potassium (mg)  Apricots, dried,  cup / 378 mg   Apricots, raw, 1 cup halves / 401 mg   Avocado,  / 487 mg   Banana, 1 large / 487 mg   Beef, lean, round, 3 oz / 202 mg   Cantaloupe, 1 cup cubes / 427 mg   Dates, medjool, 5 whole / 835 mg   Ham, cured, 3 oz / 212 mg   Lentils, dried,  cup / 458 mg   Lima beans, frozen,  cup /  258 mg   Orange, 1 large / 333 mg   Orange juice, 1 cup / 443 mg   Peaches, dried,  cup / 398 mg   Peas, split, cooked,  cup / 355 mg   Potato, boiled, 1 medium / 515 mg   Prunes, dried, uncooked,  cup / 318 mg   Raisins,  cup / 309 mg   Salmon, pink, raw, 3 oz / 275 mg   Sardines, canned , 3 oz / 338 mg   Tomato, raw, 1 medium / 292 mg   Tomato juice, 6 oz / 417 mg   [place], 3 oz / 349 mg  Document Released:  08/24/2005 Document Revised: 05/06/2011 Document Reviewed: 01/07/2009 [facility] Patient Information 2012 [place], [place].

## 2011-12-29 NOTE — ED Notes (Signed)
The pt had lab work  Drawn yesterday.  Today she was called and told to come to the ed  Because her sod and potassium was low.  She has had nv for 2 weeks and was seen here last week and was given iv fluids

## 2011-12-29 NOTE — ED Notes (Signed)
2nd run of k started.  Dr Patria Mane has ordered 3 runs of k.  Order does not read like that

## 2011-12-29 NOTE — ED Notes (Signed)
Pt moved to CDU Room 4.  KCL #2 infusing.  Placed on cardiac monitor in SR rate 86  .  Denies pain at this time.  States "queezy" stomach form listening to others vomiting around her in strecher triage.  IV hydration in progress

## 2011-12-29 NOTE — ED Provider Notes (Addendum)
History   This chart was scribed for Lyanne Co, MD by Clarita Crane. The patient was seen in room STRE1/STRE1. Patient's care was started at 1532.    CSN: [id number]  Arrival date & time 12/29/11  1532   First MD Initiated Contact with Patient 12/29/11 1739      Chief Complaint  Patient presents with  . low k      HPI Emily Dickson is a 56 y.o. female who presents to the Emergency Department to be evaluated and treated after having low potassium level measured by PCP. Potassium level was measured at 2.2. Patient states she was evaluated by PCP initially as a result of fatigue, nausea, vomiting and diarrhea and had blood labs drawn and was prescribed 10 milliequivalents of potassium chloride 1x/daily.  Patient notes that she has not experienced nausea, vomiting, diarrhea and fatigue recently. Denies chest pain, SOB, weakness, abdominal pain.     Past Medical History  Diagnosis Date  . Hypertension     History reviewed. No pertinent past surgical history.  No family history on file.  History  Substance Use Topics  . Smoking status: Never Smoker   . Smokeless tobacco: Not on file  . Alcohol Use: No    OB History    Grav Para Term Preterm Abortions TAB SAB Ect Mult Living                  Review of Systems  Constitutional: Positive for fatigue. Negative for fever and chills.  Respiratory: Negative for shortness of breath.   Cardiovascular: Negative for chest pain.  Gastrointestinal: Positive for nausea, vomiting and diarrhea. Negative for abdominal pain.  Neurological: Negative for weakness and numbness.  All other systems reviewed and are negative.    Allergies  Penicillins  Home Medications   Current Outpatient Rx  Name Route Sig Dispense Refill  . HYDROCHLOROTHIAZIDE 25 MG PO TABS Oral Take 25 mg by mouth daily.    Marland Kitchen METOCLOPRAMIDE HCL 10 MG PO TABS Oral Take 1 tablet (10 mg total) by mouth every 6 (six) hours. 12 tablet 0  . POTASSIUM CHLORIDE ER  10 MEQ PO CPCR Oral Take 10 mEq by mouth daily.    . SULFAMETHOXAZOLE-TMP DS 800-160 MG PO TABS Oral Take 1 tablet by mouth 2 (two) times daily. For 7 days. Started on 12/23/11    . TRIAMTERENE-HCTZ 37.5-25 MG PO TABS Oral Take 1 tablet by mouth daily.      BP 123/74  Pulse 91  Temp(Src) 98.9 F (37.2 C) (Oral)  Resp 14  SpO2 97%  Physical Exam  Nursing note and vitals reviewed. Constitutional: She is oriented to person, place, and time. She appears well-developed and well-nourished. No distress.  HENT:  Head: Normocephalic and atraumatic.  Eyes: EOM are normal. Pupils are equal, round, and reactive to light.  Neck: Neck supple. No tracheal deviation present.  Cardiovascular: Normal rate.   Pulmonary/Chest: Effort normal. No respiratory distress.  Abdominal: Soft. She exhibits no distension.  Musculoskeletal: Normal range of motion. She exhibits no edema.  Neurological: She is alert and oriented to person, place, and time. No sensory deficit.  Skin: Skin is warm and dry.  Psychiatric: She has a normal mood and affect. Her behavior is normal.    ED Course  Procedures (including critical care time)   Date: 12/29/2011  Rate: 87  Rhythm: normal sinus rhythm  QRS Axis: normal  Intervals: normal  ST/T Wave abnormalities: normal  Conduction Disutrbances: none  Narrative Interpretation:   Old EKG Reviewed: No significant changes noted     DIAGNOSTIC STUDIES: Oxygen Saturation is 97% on room air, normal by my interpretation.    COORDINATION OF CARE: 5:35PM-Patient informed of current plan for treatment and evaluation and agrees with plan at this time.     Labs Reviewed  CBC - Abnormal; Notable for the following:    RBC 3.68 (*)    Hemoglobin 11.2 (*)    HCT 32.3 (*)    RDW 16.0 (*)    All other components within normal limits  BASIC METABOLIC PANEL - Abnormal; Notable for the following:    Sodium 132 (*)    Potassium 2.2 (*)    Chloride 88 (*)    Glucose, Bld 142  (*)    All other components within normal limits  URINALYSIS, ROUTINE W REFLEX MICROSCOPIC - Abnormal; Notable for the following:    Specific Gravity, Urine 1.003 (*)    All other components within normal limits   No results found.   No diagnosis found.    MDM  The patient's potassium is 2.2.  She was vomiting last week and is also on HCTZ.  I suspect that she became hypokalemic at that point.  Her only symptom suggestive of hypokalemia her mild tingling in her fingertips.  She was only written for potassium supplementation by her primary care Dr.  She will receive 3 runs of potassium IV in the emergency department dehydrated.  She'll follow back up with her physician for a recheck of her potassium.  She does not need a prescription for oral potassium supplementation a shared he has it in hand.  The patient will continue her care in the CDU  I personally performed the services described in this documentation, which was scribed in my presence. The recorded information has been reviewed and considered.      Lyanne Co, MD 12/29/11 1949  Lyanne Co, MD 12/29/11 [phone]

## 2011-12-29 NOTE — ED Notes (Signed)
The pt is comfortable  Warm blanket given 

## 2011-12-29 NOTE — ED Notes (Signed)
The pt has been taking pot pills since this am

## 2011-12-29 NOTE — ED Provider Notes (Signed)
9:08 PM Patient signed out to me at change of shift from Dr Patria Mane.  Patient found to have potassium of 2.2.  Plan is for patient to receive 3 runs of K and to be d/c home and to continue PO potassium at home.  Potassium level does not need to be rechecked prior to discharge.    10:17 PM Patient reports she is feeling fine.  No complaints or needs at this time.  She is currently on her 2nd of 3 doses of potassium.  Patient has potassium prescribed to her that is filled and with her.    11:33 PM Patient to be d/c home with PCP follow up, to take potassium pills at home.  Patient verbalizes understanding and agrees with plan.    Dillard Cannon [place], [place] 12/29/11 2333

## 2011-12-30 NOTE — ED Provider Notes (Signed)
I was the primary provider of this patient during this ER visit. The patients care was continued in the CDU and managed in conjunction with my midlevel providers   Lyanne Co, MD 12/30/11 1524

## 2012-01-27 ENCOUNTER — Other Ambulatory Visit (HOSPITAL_COMMUNITY): Payer: Self-pay | Admitting: Family Medicine

## 2012-01-27 DIAGNOSIS — Z1231 Encounter for screening mammogram for malignant neoplasm of breast: Secondary | ICD-10-CM

## 2012-03-03 ENCOUNTER — Ambulatory Visit (HOSPITAL_COMMUNITY): Payer: Self-pay

## 2012-03-04 ENCOUNTER — Ambulatory Visit (HOSPITAL_COMMUNITY): Payer: Self-pay

## 2012-03-28 ENCOUNTER — Ambulatory Visit (HOSPITAL_COMMUNITY)
Admission: RE | Admit: 2012-03-28 | Discharge: 2012-03-28 | Disposition: A | Discharge status: Home / Self Care | Payer: Self-pay | Source: Ambulatory Visit | Attending: Family Medicine | Admitting: Family Medicine

## 2012-03-28 DIAGNOSIS — Z1231 Encounter for screening mammogram for malignant neoplasm of breast: Secondary | ICD-10-CM | POA: Insufficient documentation

## 2012-04-14 ENCOUNTER — Encounter (HOSPITAL_COMMUNITY): Payer: Self-pay

## 2012-04-14 ENCOUNTER — Emergency Department (HOSPITAL_COMMUNITY)
Admission: EM | Admit: 2012-04-14 | Discharge: 2012-04-14 | Disposition: A | Discharge status: Home / Self Care | Payer: Self-pay | Attending: Emergency Medicine | Admitting: Emergency Medicine

## 2012-04-14 DIAGNOSIS — M545 Low back pain, unspecified: Secondary | ICD-10-CM | POA: Insufficient documentation

## 2012-04-14 DIAGNOSIS — I10 Essential (primary) hypertension: Secondary | ICD-10-CM | POA: Insufficient documentation

## 2012-04-14 DIAGNOSIS — M549 Dorsalgia, unspecified: Secondary | ICD-10-CM

## 2012-04-14 DIAGNOSIS — Z79899 Other long term (current) drug therapy: Secondary | ICD-10-CM | POA: Insufficient documentation

## 2012-04-14 DIAGNOSIS — Z880 Allergy status to penicillin: Secondary | ICD-10-CM | POA: Insufficient documentation

## 2012-04-14 HISTORY — DX: Dorsalgia, unspecified: M54.9

## 2012-04-14 LAB — POCT I-STAT, CHEM 8
BUN: 8 mg/dL (ref 6–23)
Calcium, Ion: 1.02 mmol/L — ABNORMAL LOW (ref 1.12–1.23)
Chloride: 99 mEq/L (ref 96–112)
Creatinine, Ser: 0.9 mg/dL (ref 0.50–1.10)
Glucose, Bld: 96 mg/dL (ref 70–99)
HCT: 41 % (ref 36.0–46.0)
Hemoglobin: 13.9 g/dL (ref 12.0–15.0)
Potassium: 3.7 mEq/L (ref 3.5–5.1)
Sodium: 140 mEq/L (ref 135–145)
TCO2: 27 mmol/L (ref 0–100)

## 2012-04-14 LAB — URINALYSIS, ROUTINE W REFLEX MICROSCOPIC
Bilirubin Urine: NEGATIVE
Glucose, UA: NEGATIVE mg/dL
Hgb urine dipstick: NEGATIVE
Ketones, ur: 15 mg/dL — AB
Leukocytes, UA: NEGATIVE
Nitrite: NEGATIVE
Protein, ur: 30 mg/dL — AB
Specific Gravity, Urine: 1.019 (ref 1.005–1.030)
Urobilinogen, UA: 1 mg/dL (ref 0.0–1.0)
pH: 7.5 (ref 5.0–8.0)

## 2012-04-14 LAB — URINE MICROSCOPIC-ADD ON

## 2012-04-14 MED ORDER — NAPROXEN 500 MG PO TABS: 500.0000 mg | ORAL_TABLET | Freq: Two times a day (BID) | ORAL | Status: DC

## 2012-04-14 MED ORDER — CYCLOBENZAPRINE HCL 10 MG PO TABS: 10.0000 mg | ORAL_TABLET | Freq: Two times a day (BID) | ORAL | Status: AC | PRN

## 2012-04-14 NOTE — ED Notes (Signed)
Patient used restroom prior to triage and was informed next time to provide a sample

## 2012-04-14 NOTE — ED Notes (Signed)
Pt complains of right flank pain onset 3 months ago, sts originally seen for same an ddx with low potassium

## 2012-04-14 NOTE — ED Notes (Signed)
Pt reports low back pain x1 day, pt seen here 3 months ago for the same. Pt denies burning w/urination, heavy lifting, or injury

## 2012-04-14 NOTE — ED Provider Notes (Signed)
History  Scribed for Emily Kras, MD, the patient was seen in room TR10C/TR10C. This chart was scribed by Candelaria Stagers. The patient's care started at 11:21 AM   CSN: [id number]  Arrival date & time 04/14/12  0802   First MD Initiated Contact with Patient 04/14/12 1112      Chief Complaint  Patient presents with  . Back Pain     The history is provided by the patient.   Wynter A Anstead is a 56 y.o. female who presents to the Emergency Department complaining of nonradiating right sided lower back pain that started about two days ago and is now getting better.  The pain is worse when moving.  She denies fever, vomiting, or SOB.   Pt reports that she was seen in the ED three months ago for the same sx and reports her potassium was low at this time.  Pt was a pt at [facility] which is now closed.      Past Medical History  Diagnosis Date  . Hypertension   . Back pain     History reviewed. No pertinent past surgical history.  No family history on file.  History  Substance Use Topics  . Smoking status: Never Smoker   . Smokeless tobacco: Not on file  . Alcohol Use: No    OB History    Grav Para Term Preterm Abortions TAB SAB Ect Mult Living                  Review of Systems  Constitutional: Negative for fever.  HENT: Negative for rhinorrhea.   Eyes: Negative for pain.  Respiratory: Negative for cough and shortness of breath.   Cardiovascular: Negative for chest pain.  Gastrointestinal: Negative for nausea, vomiting, abdominal pain and diarrhea.  Genitourinary: Negative for dysuria.  Musculoskeletal: Negative for back pain.  Skin: Negative for rash.  Neurological: Negative for weakness and headaches.    Allergies  Penicillins  Home Medications   Current Outpatient Rx  Name Route Sig Dispense Refill  . LISINOPRIL 40 MG PO TABS Oral Take 40 mg by mouth daily.    Marland Kitchen METOPROLOL TARTRATE 25 MG PO TABS Oral Take 25 mg by mouth 2 (two) times daily.    . ADULT  MULTIVITAMIN W/MINERALS CH Oral Take 1 tablet by mouth daily.      BP 155/93  Pulse 93  Temp 98.6 F (37 C) (Oral)  Resp 18  SpO2 93%  Physical Exam  Nursing note and vitals reviewed. Constitutional: She appears well-developed and well-nourished. No distress.  HENT:  Head: Normocephalic and atraumatic.  Right Ear: External ear normal.  Left Ear: External ear normal.  Eyes: Conjunctivae are normal. Right eye exhibits no discharge. Left eye exhibits no discharge. No scleral icterus.  Neck: Neck supple. No tracheal deviation present.  Cardiovascular: Normal rate.   Pulmonary/Chest: Effort normal. No stridor. No respiratory distress.  Abdominal: She exhibits no distension. There is no tenderness.  Musculoskeletal: She exhibits no edema.       Right shoulder: She exhibits no bony tenderness, no swelling, normal pulse and normal strength.       Mild tenderness right para lumbar spinal region.   Neurological: She is alert. Cranial nerve deficit: no gross deficits.  Skin: Skin is warm and dry. No rash noted.  Psychiatric: She has a normal mood and affect.    ED Course  Procedures   DIAGNOSTIC STUDIES: Oxygen Saturation is 93% on room air, normal by my interpretation.  COORDINATION OF CARE:     Labs Reviewed  URINALYSIS, ROUTINE W REFLEX MICROSCOPIC - Abnormal; Notable for the following:    APPearance CLOUDY (*)     Ketones, ur 15 (*)     Protein, ur 30 (*)     All other components within normal limits  POCT I-STAT, CHEM 8 - Abnormal; Notable for the following:    Calcium, Ion 1.02 (*)     All other components within normal limits  URINE MICROSCOPIC-ADD ON   No results found.   No diagnosis found.    MDM  Patient was concerned that she was having trouble with hypokalemia again. Laboratory results are unremarkable. No sign of acute neurological or vascular emergency associated with pt's back pain.    Safe for outpatient follow up.    I personally performed the  services described in this documentation, which was scribed in my presence.  The recorded information has been reviewed and considered.         Emily Kras, MD 04/14/12 [phone]

## 2012-06-20 ENCOUNTER — Encounter (HOSPITAL_COMMUNITY): Payer: Self-pay | Admitting: *Deleted

## 2012-06-20 ENCOUNTER — Emergency Department (HOSPITAL_COMMUNITY)
Admission: EM | Admit: 2012-06-20 | Discharge: 2012-06-20 | Disposition: A | Discharge status: Home / Self Care | Payer: Self-pay | Attending: Emergency Medicine | Admitting: Emergency Medicine

## 2012-06-20 DIAGNOSIS — I10 Essential (primary) hypertension: Secondary | ICD-10-CM | POA: Insufficient documentation

## 2012-06-20 DIAGNOSIS — Z880 Allergy status to penicillin: Secondary | ICD-10-CM | POA: Insufficient documentation

## 2012-06-20 DIAGNOSIS — M549 Dorsalgia, unspecified: Secondary | ICD-10-CM | POA: Insufficient documentation

## 2012-06-20 LAB — POCT I-STAT, CHEM 8
BUN: 10 mg/dL (ref 6–23)
Calcium, Ion: 1.23 mmol/L (ref 1.12–1.23)
Chloride: 100 mEq/L (ref 96–112)
Creatinine, Ser: 0.7 mg/dL (ref 0.50–1.10)
Glucose, Bld: 82 mg/dL (ref 70–99)
TCO2: 28 mmol/L (ref 0–100)

## 2012-06-20 MED ORDER — METOPROLOL TARTRATE 25 MG PO TABS: 25.0000 mg | ORAL_TABLET | Freq: Two times a day (BID) | ORAL | Status: DC

## 2012-06-20 MED ORDER — HYDROCHLOROTHIAZIDE 25 MG PO TABS: 25.0000 mg | ORAL_TABLET | Freq: Every day | ORAL | Status: DC

## 2012-06-20 MED ORDER — LISINOPRIL 40 MG PO TABS: 40.0000 mg | ORAL_TABLET | Freq: Every day | ORAL | Status: DC

## 2012-06-20 MED ORDER — POTASSIUM CHLORIDE ER 10 MEQ PO TBCR: 20.0000 meq | EXTENDED_RELEASE_TABLET | Freq: Two times a day (BID) | ORAL | Status: DC

## 2012-06-20 NOTE — ED Notes (Signed)
Pt is on high blood pressure medicines and is here because reading have been high and she has had intermittent dizzy spells.  NO dizziness now

## 2012-06-20 NOTE — ED Provider Notes (Signed)
History     CSN: [id number]  Arrival date & time 06/20/12  1220   First MD Initiated Contact with Patient 06/20/12 1359      Chief Complaint  Patient presents with  . Hypertension  . Dizziness    (Consider location/radiation/quality/duration/timing/severity/associated sxs/prior treatment) HPI Pt with history of HTN previously well controlled with ?HCTZ and triamterene was switched to Lisinopril and metoprolol due to marked hypokalemia has not had good control since that time. Stated her BP is always elevated at home. She has intermittent spells of dizziness, SOB, CP over the last several weeks/months but none at present. She denies any headache or blurry vision. States she took an extra metoprolol this AM. Was previously seen at [facility] but has not established with new PCP.   Past Medical History  Diagnosis Date  . Hypertension   . Back pain     History reviewed. No pertinent past surgical history.  No family history on file.  History  Substance Use Topics  . Smoking status: Never Smoker   . Smokeless tobacco: Not on file  . Alcohol Use: No    OB History    Grav Para Term Preterm Abortions TAB SAB Ect Mult Living                  Review of Systems All other systems reviewed and are negative except as noted in HPI.   Allergies  Penicillins  Home Medications   Current Outpatient Rx  Name Route Sig Dispense Refill  . LISINOPRIL 40 MG PO TABS Oral Take 40 mg by mouth daily.    Marland Kitchen METOPROLOL TARTRATE 25 MG PO TABS Oral Take 25 mg by mouth 2 (two) times daily.    . ADULT MULTIVITAMIN W/MINERALS CH Oral Take 1 tablet by mouth daily.      BP 185/94  Pulse 57  Temp 99 F (37.2 C) (Oral)  Resp 20  SpO2 95%  Physical Exam  Nursing note and vitals reviewed. Constitutional: She is oriented to person, place, and time. She appears well-developed and well-nourished.  HENT:  Head: Normocephalic and atraumatic.  Eyes: EOM are normal. Pupils are equal, round, and  reactive to light.  Neck: Normal range of motion. Neck supple.  Cardiovascular: Normal rate, normal heart sounds and intact distal pulses.   Pulmonary/Chest: Effort normal and breath sounds normal.  Abdominal: Bowel sounds are normal. She exhibits no distension. There is no tenderness.  Musculoskeletal: Normal range of motion. She exhibits no edema and no tenderness.  Neurological: She is alert and oriented to person, place, and time. She has normal strength. No cranial nerve deficit or sensory deficit.  Skin: Skin is warm and dry. No rash noted.  Psychiatric: She has a normal mood and affect.    ED Course  Procedures (including critical care time)  Labs Reviewed  POCT I-STAT, CHEM 8 - Abnormal; Notable for the following:    Potassium 3.3 (*)     All other components within normal limits   No results found.   No diagnosis found.    MDM   Date: 06/20/2012  Rate: 54  Rhythm: sinus bradycardia  QRS Axis: normal  Intervals: PR prolonged  ST/T Wave abnormalities: nonspecific T wave changes  Conduction Disutrbances:none  Narrative Interpretation: LVH  Old EKG Reviewed: changes noted, slower rate, LVH with T wave changes is worse  Chemistry unremarkable, no evidence of acute end organ damage. No indications for admission. Discussed with Dr. Dorthula Rue on call for [facility] to get  recommendations for medication management. She recommends continuing current meds and adding HCTZ with potassium supplementation. Pt given resource list as well.           Ad Guttman B. Bernette Mayers, MD 06/20/12 [phone]

## 2012-07-16 ENCOUNTER — Encounter (HOSPITAL_COMMUNITY): Payer: Self-pay | Admitting: Emergency Medicine

## 2012-07-16 ENCOUNTER — Emergency Department (HOSPITAL_COMMUNITY)
Admission: EM | Admit: 2012-07-16 | Discharge: 2012-07-16 | Disposition: A | Discharge status: Home / Self Care | Payer: Self-pay | Attending: Emergency Medicine | Admitting: Emergency Medicine

## 2012-07-16 DIAGNOSIS — I10 Essential (primary) hypertension: Secondary | ICD-10-CM | POA: Insufficient documentation

## 2012-07-16 DIAGNOSIS — Z79899 Other long term (current) drug therapy: Secondary | ICD-10-CM | POA: Insufficient documentation

## 2012-07-16 DIAGNOSIS — F1010 Alcohol abuse, uncomplicated: Secondary | ICD-10-CM | POA: Insufficient documentation

## 2012-07-16 DIAGNOSIS — F329 Major depressive disorder, single episode, unspecified: Secondary | ICD-10-CM

## 2012-07-16 DIAGNOSIS — F3289 Other specified depressive episodes: Secondary | ICD-10-CM | POA: Insufficient documentation

## 2012-07-16 DIAGNOSIS — F10929 Alcohol use, unspecified with intoxication, unspecified: Secondary | ICD-10-CM

## 2012-07-16 LAB — RAPID URINE DRUG SCREEN, HOSP PERFORMED
Cocaine: NOT DETECTED
Opiates: NOT DETECTED
Tetrahydrocannabinol: NOT DETECTED

## 2012-07-16 LAB — CBC
HCT: 41.1 % (ref 36.0–46.0)
MCHC: 33.6 g/dL (ref 30.0–36.0)
Platelets: 189 10*3/uL (ref 150–400)
RDW: 15.1 % (ref 11.5–15.5)

## 2012-07-16 LAB — ETHANOL: Alcohol, Ethyl (B): 424 mg/dL (ref 0–11)

## 2012-07-16 LAB — COMPREHENSIVE METABOLIC PANEL
Albumin: 3.6 g/dL (ref 3.5–5.2)
Alkaline Phosphatase: 104 U/L (ref 39–117)
BUN: 7 mg/dL (ref 6–23)
Potassium: 3.1 mEq/L — ABNORMAL LOW (ref 3.5–5.1)
Sodium: 137 mEq/L (ref 135–145)
Total Protein: 7.6 g/dL (ref 6.0–8.3)

## 2012-07-16 MED ORDER — SODIUM CHLORIDE 0.9 % IV BOLUS (SEPSIS)
1000.0000 mL | Freq: Once | INTRAVENOUS | Status: AC
  Administered 2012-07-16: 1000 mL via INTRAVENOUS

## 2012-07-16 NOTE — ED Notes (Signed)
[facility] contacted to assess pt

## 2012-07-16 NOTE — ED Provider Notes (Addendum)
History     CSN: [id number]  Arrival date & time 07/16/12  1545   First MD Initiated Contact with Patient 07/16/12 1732      Chief Complaint  Patient presents with  . Alcohol Intoxication    (Consider location/radiation/quality/duration/timing/severity/associated sxs/prior treatment) Patient is a 56 y.o. female presenting with intoxication. The history is provided by the patient and medical records.  Alcohol Intoxication Pertinent negatives include no chest pain, no headaches and no shortness of breath.   56 year old, female, with history of diabetes, and hypertension, presents emergency department complaining of depression.  She will not say because of her depression, which began today.  She says that she was drinking wine.  Today.  She denies drug use.  She denies pain anywhere.  She has not had vomiting.  She denies recent illness.  She says something.   occurred, which has made her depressed, but she does not want to talk about it.  She specifically denies suicidal thoughts or homicidal thoughts.  Past Medical History  Diagnosis Date  . Hypertension   . Back pain     History reviewed. No pertinent past surgical history.  History reviewed. No pertinent family history.  History  Substance Use Topics  . Smoking status: Never Smoker   . Smokeless tobacco: Not on file  . Alcohol Use: No    OB History    Grav Para Term Preterm Abortions TAB SAB Ect Mult Living                  Review of Systems  Constitutional: Negative for fever.  HENT: Negative for voice change.   Eyes: Negative for redness.  Respiratory: Negative for cough and shortness of breath.   Cardiovascular: Negative for chest pain.  Gastrointestinal: Negative for nausea and vomiting.  Genitourinary: Negative for dysuria.  Musculoskeletal: Negative for back pain.  Neurological: Negative for headaches.  Psychiatric/Behavioral: Negative for suicidal ideas, hallucinations and self-injury.  All other systems  reviewed and are negative.    Allergies  Penicillins  Home Medications   Current Outpatient Rx  Name  Route  Sig  Dispense  Refill  . HYDROCHLOROTHIAZIDE 25 MG PO TABS   Oral   Take 1 tablet (25 mg total) by mouth daily.   30 tablet   2   . LISINOPRIL 40 MG PO TABS   Oral   Take 1 tablet (40 mg total) by mouth daily.   30 tablet   2   . METOPROLOL TARTRATE 25 MG PO TABS   Oral   Take 1 tablet (25 mg total) by mouth 2 (two) times daily.   60 tablet   2   . ADULT MULTIVITAMIN W/MINERALS CH   Oral   Take 1 tablet by mouth daily.         Marland Kitchen POTASSIUM CHLORIDE ER 10 MEQ PO TBCR   Oral   Take 2 tablets (20 mEq total) by mouth 2 (two) times daily.   60 tablet   2     BP 133/65  Pulse 111  Temp 98.9 F (37.2 C) (Oral)  Resp 16  SpO2 95%  Physical Exam  Nursing note and vitals reviewed. Constitutional: She is oriented to person, place, and time. No distress.       Withdrawn, flat affect.  Strong alcohol odor  HENT:  Head: Normocephalic and atraumatic.  Eyes: Conjunctivae normal and EOM are normal.  Neck: Normal range of motion.  Cardiovascular: Regular rhythm and intact distal pulses.   No  murmur heard.      Tachycardia  Pulmonary/Chest: Effort normal and breath sounds normal.  Abdominal: Soft. Bowel sounds are normal. She exhibits no distension. There is no tenderness.  Musculoskeletal: Normal range of motion.  Neurological: She is alert and oriented to person, place, and time. No cranial nerve deficit.  Skin: Skin is warm and dry.  Psychiatric: Thought content normal.       Depressed    ED Course  Procedures (including critical care time)  Labs Reviewed  COMPREHENSIVE METABOLIC PANEL - Abnormal; Notable for the following:    Potassium 3.1 (*)     Chloride 95 (*)     Creatinine, Ser 0.44 (*)     All other components within normal limits  ETHANOL - Abnormal; Notable for the following:    Alcohol, Ethyl (B) 424 (*)     All other components within  normal limits  SALICYLATE LEVEL - Abnormal; Notable for the following:    Salicylate Lvl <2.0 (*)     All other components within normal limits  CBC  ACETAMINOPHEN LEVEL  URINE RAPID DRUG SCREEN (HOSP PERFORMED)   No results found.   No diagnosis found.  9:59 PM I spoke with the act team member.  She will evaluate the patient.  For depression without suicidal thoughts.  She will refer the patient for outpatient treatment.  10:57 PM Pt was seen by act team. She is not suicidal or homicidal.  She  Is not psychotic.   Act member gave pt resources for tx of depression. Will release.   MDM  Alcohol intoxication, with depression. No psychosis.  No suicidal or homicidal thoughts        Cheri Guppy, MD 07/16/12 2037  Cheri Guppy, MD 07/16/12 2159  Cheri Guppy, MD 07/16/12 2258

## 2012-07-16 NOTE — ED Notes (Signed)
[id number]<YQ> Expected date:07/16/12<BR> Expected time: 3:36 PM<BR> Means of arrival:<BR> Comments:<BR> Med Clearance

## 2012-07-16 NOTE — BHH Counselor (Signed)
Pt denied SI and HI. No psychosis present. She reports that she isn't interested in SA treatment as this time but she did accept written list of SA resources from Clinical research associate.

## 2012-07-16 NOTE — ED Notes (Signed)
V/o given to est. IV due to patient now being tachycardia & done.

## 2012-07-16 NOTE — ED Notes (Addendum)
Reports feels like killing herself. First stated had a plan then stated she does not know. Patient does not make eye contact, has to be coxed to get into grown, keeps self rolled over. EMS reported family stated patient has been drinking, not eating, sleeping. Also, patient hacked phlegm in her hands and wiped on sheet before I had a chance to get kleenex.

## 2012-07-16 NOTE — ED Notes (Signed)
Pt sts she is not SI/HI

## 2012-07-16 NOTE — ED Notes (Signed)
[facility] phone # [phone] call when discharged.

## 2012-07-16 NOTE — BHH Counselor (Signed)
Pt's BAL was 424 at 1600. Writer spoke w/ Emily Dickson and told him writer would assess pt after BAL has decreased. [name] sts that another BAL is being taken now.

## 2012-07-29 ENCOUNTER — Emergency Department (INDEPENDENT_AMBULATORY_CARE_PROVIDER_SITE_OTHER)
Admission: EM | Admit: 2012-07-29 | Discharge: 2012-07-29 | Disposition: A | Discharge status: Home / Self Care | Payer: Self-pay | Source: Home / Self Care

## 2012-07-29 ENCOUNTER — Encounter (HOSPITAL_COMMUNITY): Payer: Self-pay

## 2012-07-29 DIAGNOSIS — I10 Essential (primary) hypertension: Secondary | ICD-10-CM

## 2012-07-29 LAB — POCT I-STAT, CHEM 8
Chloride: 101 mEq/L (ref 96–112)
Creatinine, Ser: 0.7 mg/dL (ref 0.50–1.10)
Glucose, Bld: 84 mg/dL (ref 70–99)
Potassium: 3.3 mEq/L — ABNORMAL LOW (ref 3.5–5.1)

## 2012-07-29 MED ORDER — POTASSIUM CHLORIDE CRYS ER 20 MEQ PO TBCR
EXTENDED_RELEASE_TABLET | ORAL | Status: AC
  Filled 2012-07-29: qty 2

## 2012-07-29 MED ORDER — POTASSIUM CHLORIDE ER 10 MEQ PO TBCR: 20.0000 meq | EXTENDED_RELEASE_TABLET | Freq: Three times a day (TID) | ORAL | Status: DC

## 2012-07-29 MED ORDER — HYDROCHLOROTHIAZIDE 25 MG PO TABS: 25.0000 mg | ORAL_TABLET | Freq: Every day | ORAL | Status: DC

## 2012-07-29 MED ORDER — AMLODIPINE BESYLATE 5 MG PO TABS: 10.0000 mg | ORAL_TABLET | Freq: Every day | ORAL | Status: DC

## 2012-07-29 MED ORDER — POTASSIUM CHLORIDE CRYS ER 20 MEQ PO TBCR
40.0000 meq | EXTENDED_RELEASE_TABLET | Freq: Once | ORAL | Status: AC
  Administered 2012-07-29: 40 meq via ORAL

## 2012-07-29 NOTE — ED Notes (Signed)
Patient here for follow up from hospital visit

## 2012-07-29 NOTE — ED Provider Notes (Signed)
Patient Demographics  Emily Dickson, is a 56 y.o. female  ZOX:[id number]  [id number]  DOB - 1956-06-15  Chief Complaint  Patient presents with  . Follow-up    hospital        Subjective:   Geraldy Worster today has, No headache, No chest pain, No abdominal pain - No Nausea, No new weakness tingling or numbness, No Cough - SOB. Not drinking any alcohol now or smoking.  Objective:    Filed Vitals:   07/29/12 1553  BP: 176/90  Pulse: 59  Temp: 98.2 F (36.8 C)  TempSrc: Oral  SpO2: 100%     Exam  Awake Alert, Oriented X 3, No new F.N deficits, Normal affect [place].AT,PERRAL Supple Neck,No JVD, No cervical lymphadenopathy appriciated.  Symmetrical Chest wall movement, Good air movement bilaterally, CTAB RRR,No Gallops,Rubs or new Murmurs, No Parasternal Heave +ve B.Sounds, Abd Soft, Non tender, No organomegaly appriciated, No rebound - guarding or rigidity. No Cyanosis, Clubbing or edema, No new Rash or bruise      Data Review   CBC  Lab 07/29/12 1613  WBC --  HGB 13.6  HCT 40.0  PLT --  MCV --  MCH --  MCHC --  RDW --  LYMPHSABS --  MONOABS --  EOSABS --  BASOSABS --  BANDABS --    Chemistries    Lab 07/29/12 1613  NA 140  K 3.3*  CL 101  CO2 --  GLUCOSE 84  BUN 13  CREATININE 0.70  CALCIUM --  MG --  AST --  ALT --  ALKPHOS --  BILITOT --   ------------------------------------------------------------------------------------------------------------------ No results found for this basename: HGBA1C:2 in the last 72 hours ------------------------------------------------------------------------------------------------------------------ No results found for this basename: CHOL:2,HDL:2,LDLCALC:2,TRIG:2,CHOLHDL:2,LDLDIRECT:2 in the last 72 hours ------------------------------------------------------------------------------------------------------------------ No results found for this basename: TSH,T4TOTAL,FREET3,T3FREE,THYROIDAB in the  last 72 hours ------------------------------------------------------------------------------------------------------------------ No results found for this basename: VITAMINB12:2,FOLATE:2,FERRITIN:2,TIBC:2,IRON:2,RETICCTPCT:2 in the last 72 hours  Coagulation profile  No results found for this basename: INR:5,PROTIME:5 in the last 168 hours     Prior to Admission medications   Medication Sig Start Date End Date Taking? Authorizing Provider  loratadine (CLARITIN) 10 MG tablet Take 10 mg by mouth daily.   Yes Historical Provider, MD  amLODipine (NORVASC) 5 MG tablet Take 2 tablets (10 mg total) by mouth daily. 07/29/12   Leroy Sea, MD  hydrochlorothiazide (HYDRODIURIL) 25 MG tablet Take 1 tablet (25 mg total) by mouth daily. 06/20/12   Charles B. Bernette Mayers, MD  lisinopril (PRINIVIL,ZESTRIL) 40 MG tablet Take 1 tablet (40 mg total) by mouth daily. 06/20/12   Charles B. Bernette Mayers, MD  metoprolol tartrate (LOPRESSOR) 25 MG tablet Take 1 tablet (25 mg total) by mouth 2 (two) times daily. 06/20/12   Charles B. Bernette Mayers, MD  Multiple Vitamin (MULTIVITAMIN WITH MINERALS) TABS Take 1 tablet by mouth daily.    Historical Provider, MD  potassium chloride (K-DUR) 10 MEQ tablet Take 2 tablets (20 mEq total) by mouth 2 (two) times daily. 06/20/12   Charles B. Bernette Mayers, MD     Assessment & Plan   Agent here for routine followup visit to get her blood pressure and potassium monitored. No subjective complaints.  1. Essential hypertension in poor control, home medications will be continued, will add Norvasc and get her back in 2 weeks to followup on her blood pressure.   2. Hypokalemia which was diagnosed recently after she was started on a fluid pill, repeat labs in the office show persistent hyperkalemia, have bumped her potassium  to 20 mg 3 times a day, she will come back to this clinic in 2 weeks for repeat blood work.    Follow-up Information    Follow up with Pcp Not In System. Schedule an  appointment as soon as possible for a visit in 2 weeks.   Contact information:   this clinic          Leroy Sea M.D on 07/29/2012 at 4:23 PM   Leroy Sea, MD 07/29/12 [phone]

## 2012-08-11 ENCOUNTER — Emergency Department (HOSPITAL_COMMUNITY)
Admission: EM | Admit: 2012-08-11 | Discharge: 2012-08-11 | Disposition: A | Discharge status: Home / Self Care | Payer: Self-pay | Source: Home / Self Care

## 2012-08-11 ENCOUNTER — Encounter (HOSPITAL_COMMUNITY): Payer: Self-pay

## 2012-08-11 DIAGNOSIS — E876 Hypokalemia: Secondary | ICD-10-CM

## 2012-08-11 DIAGNOSIS — I10 Essential (primary) hypertension: Secondary | ICD-10-CM

## 2012-08-11 LAB — BASIC METABOLIC PANEL
BUN: 16 mg/dL (ref 6–23)
CO2: 29 mEq/L (ref 19–32)
Chloride: 97 mEq/L (ref 96–112)
Creatinine, Ser: 0.69 mg/dL (ref 0.50–1.10)
Glucose, Bld: 95 mg/dL (ref 70–99)

## 2012-08-11 NOTE — ED Notes (Signed)
Pt. States here for a follow up for her blood pressure and potassium

## 2012-08-11 NOTE — ED Provider Notes (Signed)
Patient Demographics  Emily Dickson, is a 56 y.o. female  WUJ:[id number]  [id number]  DOB - 1956/04/06  Chief Complaint  Patient presents with  . Follow-up    b/p and potassium        Subjective:   Emily Dickson is a 56 year old with hypertension last seen 2 weeks ago with uncontrolled blood pressure, and hypokalemia, she comes back for followup today. She denies any leg cramps. Also today has, No headache, No chest pain, No abdominal pain - No Nausea, No new weakness tingling or numbness, No Cough - SOB. Not drinking any alcohol now or smoking.  Objective:    Filed Vitals:   08/11/12 1520  BP: 125/80  Pulse: 77  Temp: 99 F (37.2 C)  TempSrc: Oral  Resp: 19  SpO2: 99%     Exam  Awake Alert, Oriented X 3, nonfocal, Normal affect [place].AT,PERRAL Supple Neck,No JVD, No cervical lymphadenopathy appriciated.  Symmetrical Chest wall movement, Good air movement bilaterally, CTAB RRR,No Gallops,Rubs or new Murmurs, No Parasternal Heave +ve B.Sounds, Abd Soft, Non tender, No organomegaly appriciated, No rebound - guarding or rigidity. +trace edema, No Cyanosis, Clubbing , no rash    Data Review   CBC No results found for this basename: WBC:5,HGB:5,HCT:5,PLT:5,MCV:5,MCH:5,MCHC:5,RDW:5,NEUTRABS:5,LYMPHSABS:5,MONOABS:5,EOSABS:5,BASOSABS:5,BANDABS:5,BANDSABD:5 in the last 168 hours  Chemistries   No results found for this basename: NA:5,K:5,CL:5,CO2:5,GLUCOSE:5,BUN:5,CREATININE:5,GFRCGP,:5,CALCIUM:5,MG:5,AST:5,ALT:5,ALKPHOS:5,BILITOT:5 in the last 168 hours ------------------------------------------------------------------------------------------------------------------ No results found for this basename: HGBA1C:2 in the last 72 hours ------------------------------------------------------------------------------------------------------------------ No results found for this basename: CHOL:2,HDL:2,LDLCALC:2,TRIG:2,CHOLHDL:2,LDLDIRECT:2 in the last 72  hours ------------------------------------------------------------------------------------------------------------------ No results found for this basename: TSH,T4TOTAL,FREET3,T3FREE,THYROIDAB in the last 72 hours ------------------------------------------------------------------------------------------------------------------ No results found for this basename: VITAMINB12:2,FOLATE:2,FERRITIN:2,TIBC:2,IRON:2,RETICCTPCT:2 in the last 72 hours  Coagulation profile  No results found for this basename: INR:5,PROTIME:5 in the last 168 hours     Prior to Admission medications   Medication Sig Start Date End Date Taking? Authorizing Provider  loratadine (CLARITIN) 10 MG tablet Take 10 mg by mouth daily.   Yes Historical Provider, MD  amLODipine (NORVASC) 5 MG tablet Take 2 tablets (10 mg total) by mouth daily. 07/29/12   Leroy Sea, MD  hydrochlorothiazide (HYDRODIURIL) 25 MG tablet Take 1 tablet (25 mg total) by mouth daily. 06/20/12   Charles B. Bernette Mayers, MD  lisinopril (PRINIVIL,ZESTRIL) 40 MG tablet Take 1 tablet (40 mg total) by mouth daily. 06/20/12   Charles B. Bernette Mayers, MD  metoprolol tartrate (LOPRESSOR) 25 MG tablet Take 1 tablet (25 mg total) by mouth 2 (two) times daily. 06/20/12   Charles B. Bernette Mayers, MD  Multiple Vitamin (MULTIVITAMIN WITH MINERALS) TABS Take 1 tablet by mouth daily.    Historical Provider, MD  potassium chloride (K-DUR) 10 MEQ tablet Take 2 tablets (20 mEq total) by mouth 2 (two) times daily. 06/20/12   Charles B. Bernette Mayers, MD     Assessment & Plan   Agent here for routine followup visit to get her blood pressure and potassium monitored. No subjective complaints.  1. Essential hypertension -blood pressure control much improved, continue current blood pressure medications without changes. 2. Hypokalemia -obtain Bmet to follow up on potassium,  Follow-up Information    Please follow up. (in 2 mos at Adult care  clinic)           Denver Faster.D on  08/11/2012 at 3:54 PM     Kela Millin, MD 08/11/12 [phone]

## 2012-10-17 ENCOUNTER — Encounter (HOSPITAL_COMMUNITY): Payer: Self-pay

## 2012-10-17 ENCOUNTER — Emergency Department (INDEPENDENT_AMBULATORY_CARE_PROVIDER_SITE_OTHER)
Admission: EM | Admit: 2012-10-17 | Discharge: 2012-10-17 | Disposition: A | Discharge status: Home / Self Care | Payer: No Typology Code available for payment source | Source: Home / Self Care | Attending: Family Medicine | Admitting: Family Medicine

## 2012-10-17 DIAGNOSIS — I10 Essential (primary) hypertension: Secondary | ICD-10-CM

## 2012-10-17 MED ORDER — METOPROLOL TARTRATE 25 MG PO TABS: 25.0000 mg | ORAL_TABLET | Freq: Two times a day (BID) | ORAL | Status: DC

## 2012-10-17 MED ORDER — LISINOPRIL-HYDROCHLOROTHIAZIDE 20-12.5 MG PO TABS: 1.0000 | ORAL_TABLET | Freq: Every day | ORAL | Status: DC

## 2012-10-17 MED ORDER — AMLODIPINE BESYLATE 5 MG PO TABS: 10.0000 mg | ORAL_TABLET | Freq: Every day | ORAL | Status: DC

## 2012-10-17 NOTE — ED Provider Notes (Signed)
History     CSN: [id number]  Arrival date & time 10/17/12  1224   First MD Initiated Contact with Patient 10/17/12 1306      Chief Complaint  Patient presents with  . Follow-up   HPI The patient is presenting today to followup for her hypertension.  She reports that she's taking so many blood pressure medications that she's began to become hypotensive.  She reports that she had stopped taking several of the medications because she was afraid that because her blood pressure dropped so low and she was having symptoms that she would not wake up in the morning.  She reports that she would like to have some adjustments made to her blood pressure regimen to simplify her regimen and to decrease the amount of medication she's taking.  She reports that she has had some blood pressures in the 90/50 range that cause her to feel really bad.  Past Medical History  Diagnosis Date  . Hypertension   . Back pain     History reviewed. No pertinent past surgical history.  No family history on file.  History  Substance Use Topics  . Smoking status: Never Smoker   . Smokeless tobacco: Not on file  . Alcohol Use: No    OB History   Grav Para Term Preterm Abortions TAB SAB Ect Mult Living                  Review of Systems  Constitutional: Positive for fatigue.  Neurological: Positive for light-headedness.  All other systems reviewed and are negative.    Allergies  Penicillins  Home Medications   Current Outpatient Rx  Name  Route  Sig  Dispense  Refill  . amLODipine (NORVASC) 5 MG tablet   Oral   Take 2 tablets (10 mg total) by mouth daily.   30 tablet   2   . lisinopril-hydrochlorothiazide (ZESTORETIC) 20-12.5 MG per tablet   Oral   Take 1 tablet by mouth daily.   30 tablet   3   . loratadine (CLARITIN) 10 MG tablet   Oral   Take 10 mg by mouth daily.         . metoprolol tartrate (LOPRESSOR) 25 MG tablet   Oral   Take 1 tablet (25 mg total) by mouth 2 (two) times  daily.   60 tablet   3   . Multiple Vitamin (MULTIVITAMIN WITH MINERALS) TABS   Oral   Take 1 tablet by mouth daily.         . potassium chloride (K-DUR) 10 MEQ tablet   Oral   Take 2 tablets (20 mEq total) by mouth 3 (three) times daily.   60 tablet   2     BP 120/80  Pulse 85  Temp(Src) 98.3 F (36.8 C) (Oral)  SpO2 100%  Physical Exam  Nursing note and vitals reviewed. Constitutional: She is oriented to person, place, and time. She appears well-developed and well-nourished. No distress.  HENT:  Head: Normocephalic and atraumatic.  Right Ear: External ear normal.  Left Ear: External ear normal.  Nose: Nose normal.  Eyes: Conjunctivae and EOM are normal. Pupils are equal, round, and reactive to light.  Neck: Normal range of motion. Neck supple. No JVD present. No tracheal deviation present. No thyromegaly present.  Cardiovascular: Normal rate, regular rhythm and normal heart sounds.   Pulmonary/Chest: Effort normal and breath sounds normal.  Abdominal: Soft. Bowel sounds are normal.  Musculoskeletal: Normal range of motion. She  exhibits no edema and no tenderness.  Lymphadenopathy:    She has no cervical adenopathy.  Neurological: She is alert and oriented to person, place, and time.  Skin: Skin is warm and dry. No rash noted. No erythema. No pallor.  Psychiatric: She has a normal mood and affect. Her behavior is normal. Judgment and thought content normal.    ED Course  Procedures (including critical care time)  Labs Reviewed - No data to display No results found.   1. Essential hypertension, benign   2.  Hypotension related to the excess blood pressure medication 3.  History of hypokalemia  MDM   RECOMMENDATIONS / PLAN Pt has been having hypotension and not taking her BP meds because her BP has been dropping low.  We adjusted her BP regimen today.  Decreased amlodipine to 5 mg po daily. Changed her to lisinoprilHCTZ 20/12.5 take 1 po daily (decreasing  the dose of lisinopril and HCTZ by 50%) DC potassium supplements Continue metoprolol 25 mg po BID.  I asked the patient to please call our service or return if she has persistent hypotension and we will further reduce her blood pressure medications. I encouraged the patient to continue monitoring her blood pressure at home regularly like she has been doing.  She verbalized understanding.  FOLLOW UP 3 months for labs and blood pressure followup  The patient was given clear instructions to go to ER or return to medical center if symptoms don't improve, worsen or new problems develop.  The patient verbalized understanding.  The patient was told to call to get lab results if they haven't heard anything in the next week.            Cleora Fleet, MD 10/17/12 1430

## 2012-10-17 NOTE — ED Notes (Signed)
Follow up- history of HTN

## 2013-02-06 ENCOUNTER — Ambulatory Visit: Payer: No Typology Code available for payment source | Attending: Family Medicine | Admitting: Family Medicine

## 2013-02-06 VITALS — BP 111/72 | HR 70 | Temp 98.5°F | Resp 18 | Wt 241.2 lb

## 2013-02-06 DIAGNOSIS — R5383 Other fatigue: Secondary | ICD-10-CM

## 2013-02-06 DIAGNOSIS — R5381 Other malaise: Secondary | ICD-10-CM

## 2013-02-06 DIAGNOSIS — I10 Essential (primary) hypertension: Secondary | ICD-10-CM | POA: Insufficient documentation

## 2013-02-06 MED ORDER — METOPROLOL TARTRATE 25 MG PO TABS: 25.0000 mg | ORAL_TABLET | Freq: Two times a day (BID) | ORAL | Status: DC

## 2013-02-06 MED ORDER — AMLODIPINE BESYLATE 10 MG PO TABS: 10.0000 mg | ORAL_TABLET | Freq: Every day | ORAL | Status: DC

## 2013-02-06 MED ORDER — LISINOPRIL-HYDROCHLOROTHIAZIDE 20-12.5 MG PO TABS: 1.0000 | ORAL_TABLET | Freq: Every day | ORAL | Status: DC

## 2013-02-06 NOTE — Progress Notes (Signed)
Patient ID: Emily Dickson, female   DOB: 30-Jan-1956, 57 y.o.   MRN: [id number]  CC: follow up   HPI: Pt presents to follow up for HTN.  She has lost job but otherwise has been doing well.  Pt did go to see the dentist for treatment of his teeth.    Allergies  Allergen Reactions  . Penicillins Rash   Past Medical History  Diagnosis Date  . Hypertension   . Back pain    Current Outpatient Prescriptions on File Prior to Visit  Medication Sig Dispense Refill  . loratadine (CLARITIN) 10 MG tablet Take 10 mg by mouth daily.      . Multiple Vitamin (MULTIVITAMIN WITH MINERALS) TABS Take 1 tablet by mouth daily.      . [DISCONTINUED] lisinopril (PRINIVIL,ZESTRIL) 40 MG tablet Take 1 tablet (40 mg total) by mouth daily.  30 tablet  2   No current facility-administered medications on file prior to visit.   History reviewed. No pertinent family history. History   Social History  . Marital Status: Single    Spouse Name: N/A    Number of Children: N/A  . Years of Education: N/A   Occupational History  . Not on file.   Social History Main Topics  . Smoking status: Never Smoker   . Smokeless tobacco: Not on file  . Alcohol Use: No  . Drug Use: No  . Sexually Active: No   Other Topics Concern  . Not on file   Social History Narrative  . No narrative on file    Review of Systems  Constitutional: Negative for fever, chills, diaphoresis, activity change, appetite change and fatigue.  HENT: Negative for ear pain, nosebleeds, congestion, facial swelling, rhinorrhea, neck pain, neck stiffness and ear discharge.   Eyes: Negative for pain, discharge, redness, itching and visual disturbance.  Respiratory: Negative for cough, choking, chest tightness, shortness of breath, wheezing and stridor.   Cardiovascular: Negative for chest pain, palpitations and leg swelling.  Gastrointestinal: Negative for abdominal distention.  Genitourinary: Negative for dysuria, urgency, frequency,  hematuria, flank pain, decreased urine volume, difficulty urinating and dyspareunia.  Musculoskeletal: Negative for back pain, joint swelling, arthralgias and gait problem.  Neurological: Negative for dizziness, tremors, seizures, syncope, facial asymmetry, speech difficulty, weakness, light-headedness, numbness and headaches.  Hematological: Negative for adenopathy. Does not bruise/bleed easily.  Psychiatric/Behavioral: Negative for hallucinations, behavioral problems, confusion, dysphoric mood, decreased concentration and agitation.    Objective:   Filed Vitals:   02/06/13 1732  BP: 111/72  Pulse: 70  Temp: 98.5 F (36.9 C)  Resp: 18    Physical Exam  Constitutional: Appears well-developed and well-nourished. No distress.  HENT: Normocephalic. External right and left ear normal. Oropharynx is clear and moist.  Eyes: Conjunctivae and EOM are normal. PERRLA, no scleral icterus.  Neck: Normal ROM. Neck supple. No JVD. No tracheal deviation. No thyromegaly.  CVS: RRR, S1/S2 +, no murmurs, no gallops, no carotid bruit.  Pulmonary: Effort and breath sounds normal, no stridor, rhonchi, wheezes, rales.  Abdominal: Soft. BS +,  no distension, tenderness, rebound or guarding.  Musculoskeletal: Normal range of motion. No edema and no tenderness.  Lymphadenopathy: No lymphadenopathy noted, cervical, inguinal. Neuro: Alert. Normal reflexes, muscle tone coordination. No cranial nerve deficit. Skin: Skin is warm and dry. No rash noted. Not diaphoretic. No erythema. No pallor.  Psychiatric: Normal mood and affect. Behavior, judgment, thought content normal.   Lab Results  Component Value Date   WBC 7.2 07/16/2012  HGB 13.6 07/29/2012   HCT 40.0 07/29/2012   MCV 83.7 07/16/2012   PLT 189 07/16/2012   Lab Results  Component Value Date   CREATININE 0.69 08/11/2012   BUN 16 08/11/2012   NA 138 08/11/2012   K 3.5 08/11/2012   CL 97 08/11/2012   CO2 29 08/11/2012    No results found for this  basename: HGBA1C   Lipid Panel     Component Value Date/Time   CHOL 232* 12/23/2009 2018   TRIG 161* 12/23/2009 2018   HDL 54 12/23/2009 2018   CHOLHDL 4.3 Ratio 12/23/2009 2018   VLDL 32 12/23/2009 2018   LDLCALC 146* 12/23/2009 2018       Assessment and plan:   Patient Active Problem List   Diagnosis Date Noted  . ESSENTIAL HYPERTENSION, BENIGN 02/27/2010  . CHEST PAIN UNSPECIFIED 02/27/2010  . ELECTROCARDIOGRAM, ABNORMAL 02/27/2010   Refilled BP meds today Check labs  Encouraged patient to help get new job  The patient was given clear instructions to go to ER or return to medical center if symptoms don't improve, worsen or new problems develop.  The patient verbalized understanding.  The patient was told to call to get lab results if they haven't heard anything in the next week.    Rodney Langton, MD, CDE, FAAFP [facility] [place], [place]

## 2013-02-06 NOTE — Patient Instructions (Signed)
Hypertension  As your heart beats, it forces blood through your arteries. This force is your blood pressure. If the pressure is too high, it is called hypertension (HTN) or high blood pressure. HTN is dangerous because you may have it and not know it. High blood pressure may mean that your heart has to work harder to pump blood. Your arteries may be narrow or stiff. The extra work puts you at risk for heart disease, stroke, and other problems.   Blood pressure consists of two numbers, a higher number over a lower, 110/72, for example. It is stated as "110 over 72." The ideal is below 120 for the top number (systolic) and under 80 for the bottom (diastolic). Write down your blood pressure today.  You should pay close attention to your blood pressure if you have certain conditions such as:   Heart failure.   Prior heart attack.   Diabetes   Chronic kidney disease.   Prior stroke.   Multiple risk factors for heart disease.  To see if you have HTN, your blood pressure should be measured while you are seated with your arm held at the level of the heart. It should be measured at least twice. A one-time elevated blood pressure reading (especially in the Emergency Department) does not mean that you need treatment. There may be conditions in which the blood pressure is different between your right and left arms. It is important to see your caregiver soon for a recheck.  Most people have essential hypertension which means that there is not a specific cause. This type of high blood pressure may be lowered by changing lifestyle factors such as:   Stress.   Smoking.   Lack of exercise.   Excessive weight.   Drug/tobacco/alcohol use.   Eating less salt.  Most people do not have symptoms from high blood pressure until it has caused damage to the body. Effective treatment can often prevent, delay or reduce that damage.  TREATMENT    When a cause has been identified, treatment for high blood pressure is directed at the cause. There are a large number of medications to treat HTN. These fall into several categories, and your caregiver will help you select the medicines that are best for you. Medications may have side effects. You should review side effects with your caregiver.  If your blood pressure stays high after you have made lifestyle changes or started on medicines,    Your medication(s) may need to be changed.   Other problems may need to be addressed.   Be certain you understand your prescriptions, and know how and when to take your medicine.   Be sure to follow up with your caregiver within the time frame advised (usually within two weeks) to have your blood pressure rechecked and to review your medications.   If you are taking more than one medicine to lower your blood pressure, make sure you know how and at what times they should be taken. Taking two medicines at the same time can result in blood pressure that is too low.  SEEK IMMEDIATE MEDICAL CARE IF:   You develop a severe headache, blurred or changing vision, or confusion.   You have unusual weakness or numbness, or a faint feeling.   You have severe chest or abdominal pain, vomiting, or breathing problems.  MAKE SURE YOU:    Understand these instructions.   Will watch your condition.   Will get help right away if you are not doing well   or get worse.  Document Released: 08/24/2005 Document Revised: 11/16/2011 Document Reviewed: 04/13/2008  ExitCare Patient Information 2014 [employer]  DASH Diet   The DASH diet stands for "Dietary Approaches to Stop Hypertension." It is a healthy eating plan that has been shown to reduce high blood pressure (hypertension) in as little as 14 days, while also possibly providing other significant health benefits. These other health benefits include reducing the risk of breast cancer after menopause and reducing the risk of type 2 diabetes, heart disease, colon cancer, and stroke. Health benefits also include weight loss and slowing kidney failure in patients with chronic kidney disease.   DIET GUIDELINES   Limit salt (sodium). Your diet should contain less than 1500 mg of sodium daily.   Limit refined or processed carbohydrates. Your diet should include mostly whole grains. Desserts and added sugars should be used sparingly.   Include small amounts of heart-healthy fats. These types of fats include nuts, oils, and tub margarine. Limit saturated and trans fats. These fats have been shown to be harmful in the body.  CHOOSING FOODS   The following food groups are based on a 2000 calorie diet. See your Registered Dietitian for individual calorie needs.  Grains and Grain Products (6 to 8 servings daily)   Eat More Often: Whole-wheat bread, brown rice, whole-grain or wheat pasta, quinoa, popcorn without added fat or salt (air popped).   Eat Less Often: White bread, white pasta, white rice, cornbread.  Vegetables (4 to 5 servings daily)   Eat More Often: Fresh, frozen, and canned vegetables. Vegetables may be raw, steamed, roasted, or grilled with a minimal amount of fat.   Eat Less Often/Avoid: Creamed or fried vegetables. Vegetables in a cheese sauce.  Fruit (4 to 5 servings daily)   Eat More Often: All fresh, canned (in natural juice), or frozen fruits. Dried fruits without added sugar. One hundred percent fruit juice ( cup [237 mL] daily).   Eat Less Often: Dried fruits with added sugar. Canned fruit in light or heavy syrup.   Lean Meats, Fish, and Poultry (2 servings or less daily. One serving is 3 to 4 oz [85-114 g]).   Eat More Often: Ninety percent or leaner ground beef, tenderloin, sirloin. Round cuts of beef, chicken breast, turkey breast. All fish. Grill, bake, or broil your meat. Nothing should be fried.   Eat Less Often/Avoid: Fatty cuts of meat, turkey, or chicken leg, thigh, or wing. Fried cuts of meat or fish.  Dairy (2 to 3 servings)   Eat More Often: Low-fat or fat-free milk, low-fat plain or light yogurt, reduced-fat or part-skim cheese.   Eat Less Often/Avoid: Milk (whole, 2%).Whole milk yogurt. Full-fat cheeses.  Nuts, Seeds, and Legumes (4 to 5 servings per week)   Eat More Often: All without added salt.   Eat Less Often/Avoid: Salted nuts and seeds, canned beans with added salt.  Fats and Sweets (limited)   Eat More Often: Vegetable oils, tub margarines without trans fats, sugar-free gelatin. Mayonnaise and salad dressings.   Eat Less Often/Avoid: Coconut oils, palm oils, butter, stick margarine, cream, half and half, cookies, candy, pie.  FOR MORE INFORMATION  The Dash Diet Eating Plan: www.dashdiet.org  Document Released: 08/13/2011 Document Revised: 11/16/2011 Document Reviewed: 08/13/2011  [employer] Patient Information 2014 [employer].

## 2013-02-06 NOTE — Progress Notes (Signed)
Patient here for follow up hypertension

## 2013-02-07 ENCOUNTER — Telehealth: Payer: Self-pay | Admitting: *Deleted

## 2013-02-07 LAB — COMPREHENSIVE METABOLIC PANEL
CO2: 28 mEq/L (ref 19–32)
Creat: 0.57 mg/dL (ref 0.50–1.10)
Glucose, Bld: 84 mg/dL (ref 70–99)
Total Bilirubin: 0.4 mg/dL (ref 0.3–1.2)

## 2013-02-07 LAB — VITAMIN D 25 HYDROXY (VIT D DEFICIENCY, FRACTURES): Vit D, 25-Hydroxy: 44 ng/mL (ref 30–89)

## 2013-02-07 LAB — LIPID PANEL
Cholesterol: 225 mg/dL — ABNORMAL HIGH (ref 0–200)
HDL: 42 mg/dL (ref >39)
Total CHOL/HDL Ratio: 5.4 Ratio

## 2013-02-07 NOTE — Progress Notes (Signed)
02/06/13 Patient informed that  Labs results okay except cholesterol level elevated recommend  Low cholesterol and low fat diet and exercise 5x per week.  P.Dontez Hauss,RN

## 2013-02-07 NOTE — Progress Notes (Signed)
Quick Note:  Please inform patient that her labs came back OK except her cholesterol was elevated. Recommend low cholesterol, low fat diet and exercise 5x per week. Recheck labs in 4 months.   Rodney Langton, MD, CDE, FAAFP [facility] [place], [place]   ______

## 2013-02-22 ENCOUNTER — Other Ambulatory Visit: Payer: Self-pay | Admitting: Family Medicine

## 2013-02-22 DIAGNOSIS — Z1231 Encounter for screening mammogram for malignant neoplasm of breast: Secondary | ICD-10-CM

## 2013-03-30 ENCOUNTER — Ambulatory Visit (HOSPITAL_COMMUNITY): Payer: No Typology Code available for payment source

## 2013-04-06 ENCOUNTER — Ambulatory Visit (HOSPITAL_COMMUNITY)
Admission: RE | Admit: 2013-04-06 | Discharge: 2013-04-06 | Disposition: A | Discharge status: Home / Self Care | Payer: No Typology Code available for payment source | Source: Ambulatory Visit | Attending: Family Medicine | Admitting: Family Medicine

## 2013-04-06 DIAGNOSIS — Z1231 Encounter for screening mammogram for malignant neoplasm of breast: Secondary | ICD-10-CM

## 2013-04-18 ENCOUNTER — Ambulatory Visit: Payer: Self-pay | Attending: Family Medicine

## 2013-06-14 ENCOUNTER — Ambulatory Visit: Payer: No Typology Code available for payment source | Attending: Internal Medicine

## 2013-06-14 VITALS — BP 133/82 | HR 58 | Temp 99.2°F | Resp 16 | Ht 67.5 in | Wt 234.0 lb

## 2013-06-14 DIAGNOSIS — Z0000 Encounter for general adult medical examination without abnormal findings: Secondary | ICD-10-CM

## 2013-06-14 NOTE — Progress Notes (Signed)
Pt is here today for labs only. 

## 2013-06-15 LAB — COMPLETE METABOLIC PANEL WITH GFR
Albumin: 4.1 g/dL (ref 3.5–5.2)
Alkaline Phosphatase: 59 U/L (ref 39–117)
BUN: 13 mg/dL (ref 6–23)
CO2: 32 mEq/L (ref 19–32)
GFR, Est African American: >89 mL/min
GFR, Est Non African American: >89 mL/min
Glucose, Bld: 87 mg/dL (ref 70–99)
Potassium: 4.1 mEq/L (ref 3.5–5.3)
Total Bilirubin: 0.5 mg/dL (ref 0.3–1.2)

## 2013-06-22 ENCOUNTER — Other Ambulatory Visit: Payer: Self-pay | Admitting: Occupational Medicine

## 2013-06-22 ENCOUNTER — Ambulatory Visit: Payer: Self-pay

## 2013-06-22 DIAGNOSIS — Z9289 Personal history of other medical treatment: Secondary | ICD-10-CM

## 2013-07-07 ENCOUNTER — Ambulatory Visit: Payer: No Typology Code available for payment source | Attending: Internal Medicine | Admitting: Internal Medicine

## 2013-07-07 VITALS — BP 137/84 | HR 72 | Temp 98.8°F | Resp 16 | Wt 244.2 lb

## 2013-07-07 DIAGNOSIS — I10 Essential (primary) hypertension: Secondary | ICD-10-CM | POA: Insufficient documentation

## 2013-07-07 DIAGNOSIS — Z23 Encounter for immunization: Secondary | ICD-10-CM

## 2013-07-07 LAB — LIPID PANEL
Cholesterol: 246 mg/dL — ABNORMAL HIGH (ref 0–200)
Total CHOL/HDL Ratio: 5.6 Ratio

## 2013-07-07 MED ORDER — LISINOPRIL-HYDROCHLOROTHIAZIDE 20-12.5 MG PO TABS: 1.0000 | ORAL_TABLET | Freq: Every day | ORAL | Status: DC

## 2013-07-07 MED ORDER — METOPROLOL TARTRATE 25 MG PO TABS: 25.0000 mg | ORAL_TABLET | Freq: Two times a day (BID) | ORAL | Status: DC

## 2013-07-07 MED ORDER — AMLODIPINE BESYLATE 10 MG PO TABS: 10.0000 mg | ORAL_TABLET | Freq: Every day | ORAL | Status: DC

## 2013-07-07 NOTE — Patient Instructions (Addendum)

## 2013-07-07 NOTE — Progress Notes (Signed)
Patient ID: Emily Dickson, female   DOB: 05/07/1956, 57 y.o.   MRN: [id number] Patient Demographics  Emily Dickson, is a 57 y.o. female  [id number]  [id number]  DOB - [birth year]  Chief Complaint  Patient presents with  . Follow-up        Subjective:   Emily Dickson is a 57 y.o. female here today for a follow up visit. Patient has no new complaints, needs refill of her medications. Patient is compliant with medication, she lost her job recently and has been stressed out lately until last week when she found another job, she feels much better now. She does not smoke cigarette, she does not drink alcohol.  Patient has No headache, No chest pain, No abdominal pain - No Nausea, No new weakness tingling or numbness, No Cough - SOB.  ALLERGIES: Allergies  Allergen Reactions  . Penicillins Rash    PAST MEDICAL HISTORY: Past Medical History  Diagnosis Date  . Hypertension   . Back pain     MEDICATIONS AT HOME: Prior to Admission medications   Medication Sig Start Date End Date Taking? Authorizing Provider  amLODipine (NORVASC) 10 MG tablet Take 1 tablet (10 mg total) by mouth daily. 07/07/13   Jeanann Lewandowsky, MD  lisinopril-hydrochlorothiazide (ZESTORETIC) 20-12.5 MG per tablet Take 1 tablet by mouth daily. 07/07/13   Jeanann Lewandowsky, MD  loratadine (CLARITIN) 10 MG tablet Take 10 mg by mouth daily.    Historical Provider, MD  metoprolol tartrate (LOPRESSOR) 25 MG tablet Take 1 tablet (25 mg total) by mouth 2 (two) times daily. 07/07/13   Jeanann Lewandowsky, MD  Multiple Vitamin (MULTIVITAMIN WITH MINERALS) TABS Take 1 tablet by mouth daily.    Historical Provider, MD     Objective:   Filed Vitals:   07/07/13 1642  BP: 137/84  Pulse: 72  Temp: 98.8 F (37.1 C)  Resp: 16  Weight: 244 lb 3.2 oz (110.768 kg)  SpO2: 100%    Exam General appearance : Awake, alert, not in any distress. Speech Clear. Not toxic looking, obese HEENT: Atraumatic and  Normocephalic, pupils equally reactive to light and accomodation Neck: supple, no JVD. No cervical lymphadenopathy.  Chest:Good air entry bilaterally, no added sounds  CVS: S1 S2 regular, no murmurs.  Abdomen: Bowel sounds present, Non tender and not distended with no gaurding, rigidity or rebound. Extremities: B/L Lower Ext shows no edema, both legs are warm to touch Neurology: Awake alert, and oriented X 3, CN II-XII intact, Non focal Skin:No Rash Wounds:N/A   Data Review   CBC No results found for this basename: WBC, HGB, HCT, PLT, MCV, MCH, MCHC, RDW, NEUTRABS, LYMPHSABS, MONOABS, EOSABS, BASOSABS, BANDABS, BANDSABD,  in the last 168 hours  Chemistries   No results found for this basename: NA, K, CL, CO2, GLUCOSE, BUN, CREATININE, GFRCGP, CALCIUM, MG, AST, ALT, ALKPHOS, BILITOT,  in the last 168 hours ------------------------------------------------------------------------------------------------------------------ No results found for this basename: HGBA1C,  in the last 72 hours ------------------------------------------------------------------------------------------------------------------ No results found for this basename: CHOL, HDL, LDLCALC, TRIG, CHOLHDL, LDLDIRECT,  in the last 72 hours ------------------------------------------------------------------------------------------------------------------ No results found for this basename: TSH, T4TOTAL, FREET3, T3FREE, THYROIDAB,  in the last 72 hours ------------------------------------------------------------------------------------------------------------------ No results found for this basename: VITAMINB12, FOLATE, FERRITIN, TIBC, IRON, RETICCTPCT,  in the last 72 hours  Coagulation profile  No results found for this basename: INR, PROTIME,  in the last 168 hours    Assessment & Plan   Patient Active Problem List   Diagnosis Date Noted  .  Unspecified essential hypertension 07/07/2013  . ESSENTIAL HYPERTENSION, BENIGN  02/27/2010  . ELECTROCARDIOGRAM, ABNORMAL 02/27/2010     Plan: Refill all medications Patient encouraged to continue compliance Patient counseled extensively about nutrition and exercise Resources for DASH diet given  Health Maintenance -Colonoscopy: Patient declined -Pap Smear: Scheduled for next visit -Mammogram: Done recently normal -Vaccinations:  -Influenza given today  Follow up in 3 months or when necessary   The patient was given clear instructions to go to ER or return to medical center if symptoms don't improve, worsen or new problems develop. The patient verbalized understanding. The patient was told to call to get lab results if they haven't heard anything in the next week.    Jeanann Lewandowsky, MD, MHA, FACP, FAAP [facility] [place], [place] [phone]   07/07/2013, 4:54 PM

## 2013-07-07 NOTE — Progress Notes (Signed)
Patient here for follow up on her HTN 

## 2013-07-08 LAB — VITAMIN D 25 HYDROXY (VIT D DEFICIENCY, FRACTURES): Vit D, 25-Hydroxy: 32 ng/mL (ref 30–89)

## 2013-10-06 ENCOUNTER — Ambulatory Visit: Payer: Self-pay | Attending: Family Medicine | Admitting: Internal Medicine

## 2013-10-06 ENCOUNTER — Encounter: Payer: Self-pay | Admitting: Internal Medicine

## 2013-10-06 VITALS — BP 143/91 | HR 60 | Temp 98.7°F | Resp 14 | Ht 67.0 in | Wt 244.4 lb

## 2013-10-06 DIAGNOSIS — I10 Essential (primary) hypertension: Secondary | ICD-10-CM | POA: Insufficient documentation

## 2013-10-06 DIAGNOSIS — E781 Pure hyperglyceridemia: Secondary | ICD-10-CM | POA: Insufficient documentation

## 2013-10-06 LAB — CBC WITH DIFFERENTIAL/PLATELET
Basophils Absolute: 0 10*3/uL (ref 0.0–0.1)
Basophils Relative: 1 % (ref 0–1)
EOS PCT: 4 % (ref 0–5)
Eosinophils Absolute: 0.3 10*3/uL (ref 0.0–0.7)
HEMATOCRIT: 38.9 % (ref 36.0–46.0)
Hemoglobin: 13 g/dL (ref 12.0–15.0)
LYMPHS ABS: 2.6 10*3/uL (ref 0.7–4.0)
Lymphocytes Relative: 44 % (ref 12–46)
MCH: 29.1 pg (ref 26.0–34.0)
MCHC: 33.4 g/dL (ref 30.0–36.0)
MCV: 87 fL (ref 78.0–100.0)
Monocytes Absolute: 0.5 10*3/uL (ref 0.1–1.0)
Monocytes Relative: 8 % (ref 3–12)
Neutro Abs: 2.5 10*3/uL (ref 1.7–7.7)
Neutrophils Relative %: 43 % (ref 43–77)
Platelets: 211 10*3/uL (ref 150–400)
RBC: 4.47 MIL/uL (ref 3.87–5.11)
RDW: 15.2 % (ref 11.5–15.5)
WBC: 5.9 10*3/uL (ref 4.0–10.5)

## 2013-10-06 LAB — COMPLETE METABOLIC PANEL WITH GFR
ALK PHOS: 66 U/L (ref 39–117)
ALT: 16 U/L (ref 0–35)
AST: 19 U/L (ref 0–37)
Albumin: 4.2 g/dL (ref 3.5–5.2)
BUN: 18 mg/dL (ref 6–23)
CALCIUM: 9.8 mg/dL (ref 8.4–10.5)
CHLORIDE: 100 meq/L (ref 96–112)
CO2: 34 mEq/L — ABNORMAL HIGH (ref 19–32)
Creat: 0.64 mg/dL (ref 0.50–1.10)
GFR, Est African American: >89 mL/min
GFR, Est Non African American: >89 mL/min
Glucose, Bld: 88 mg/dL (ref 70–99)
Potassium: 4 mEq/L (ref 3.5–5.3)
Sodium: 139 mEq/L (ref 135–145)
Total Bilirubin: 0.4 mg/dL (ref 0.2–1.2)
Total Protein: 7.6 g/dL (ref 6.0–8.3)

## 2013-10-06 LAB — POCT GLYCOSYLATED HEMOGLOBIN (HGB A1C): Hemoglobin A1C: 5.7

## 2013-10-06 LAB — LIPID PANEL
CHOL/HDL RATIO: 4 ratio
CHOLESTEROL: 226 mg/dL — AB (ref 0–200)
HDL: 57 mg/dL (ref >39)
LDL CALC: 153 mg/dL — AB (ref 0–99)
Triglycerides: 82 mg/dL (ref <150)
VLDL: 16 mg/dL (ref 0–40)

## 2013-10-06 MED ORDER — LISINOPRIL-HYDROCHLOROTHIAZIDE 20-12.5 MG PO TABS: 1.0000 | ORAL_TABLET | Freq: Every day | ORAL | Status: DC

## 2013-10-06 MED ORDER — METOPROLOL TARTRATE 25 MG PO TABS
25.0000 mg | ORAL_TABLET | Freq: Two times a day (BID) | ORAL | Status: DC
Start: 2013-10-06 — End: 2014-04-16

## 2013-10-06 MED ORDER — GEMFIBROZIL 600 MG PO TABS: 600.0000 mg | ORAL_TABLET | Freq: Every morning | ORAL | Status: DC

## 2013-10-06 MED ORDER — LORATADINE 10 MG PO TABS: 10.0000 mg | ORAL_TABLET | Freq: Every day | ORAL | Status: DC

## 2013-10-06 MED ORDER — AMLODIPINE BESYLATE 10 MG PO TABS
10.0000 mg | ORAL_TABLET | Freq: Every day | ORAL | Status: DC
Start: 2013-10-06 — End: 2014-04-16

## 2013-10-06 NOTE — Progress Notes (Signed)
Pt is here a f/u for hypertension. BP today is 143/91. Pt has no complaints today.

## 2013-10-06 NOTE — Progress Notes (Signed)
Patient ID: Emily Dickson, female   DOB: Feb 02, 1956, 58 y.o.   MRN: [id number]   CC:  HPI:  58 year old female with history of hypertension, here to followup on blood pressure. The patient takes 3 medications. She denies any cardiopulmonary symptoms. She does not smoke. No other focal symptoms, she is requesting a Pap smear and has not had it for about 3 years.   Allergies  Allergen Reactions  . Penicillins Rash   Past Medical History  Diagnosis Date  . Hypertension   . Back pain    Current Outpatient Prescriptions on File Prior to Visit  Medication Sig Dispense Refill  . Multiple Vitamin (MULTIVITAMIN WITH MINERALS) TABS Take 1 tablet by mouth daily.      . [DISCONTINUED] lisinopril (PRINIVIL,ZESTRIL) 40 MG tablet Take 1 tablet (40 mg total) by mouth daily.  30 tablet  2   No current facility-administered medications on file prior to visit.   No family history on file. History   Social History  . Marital Status: Single    Spouse Name: N/A    Number of Children: N/A  . Years of Education: N/A   Occupational History  . Not on file.   Social History Main Topics  . Smoking status: Never Smoker   . Smokeless tobacco: Not on file  . Alcohol Use: No  . Drug Use: No  . Sexual Activity: No   Other Topics Concern  . Not on file   Social History Narrative  . No narrative on file    Review of Systems  Constitutional: Negative for fever, chills, diaphoresis, activity change, appetite change and fatigue.  HENT: Negative for ear pain, nosebleeds, congestion, facial swelling, rhinorrhea, neck pain, neck stiffness and ear discharge.   Eyes: Negative for pain, discharge, redness, itching and visual disturbance.  Respiratory: Negative for cough, choking, chest tightness, shortness of breath, wheezing and stridor.   Cardiovascular: Negative for chest pain, palpitations and leg swelling.  Gastrointestinal: Negative for abdominal distention.  Genitourinary: Negative for  dysuria, urgency, frequency, hematuria, flank pain, decreased urine volume, difficulty urinating and dyspareunia.  Musculoskeletal: Negative for back pain, joint swelling, arthralgias and gait problem.  Neurological: Negative for dizziness, tremors, seizures, syncope, facial asymmetry, speech difficulty, weakness, light-headedness, numbness and headaches.  Hematological: Negative for adenopathy. Does not bruise/bleed easily.  Psychiatric/Behavioral: Negative for hallucinations, behavioral problems, confusion, dysphoric mood, decreased concentration and agitation.    Objective:   Filed Vitals:   10/06/13 1650  BP: 143/91  Pulse: 60  Temp: 98.7 F (37.1 C)  Resp: 14    Physical Exam  Constitutional: Appears well-developed and well-nourished. No distress.  HENT: Normocephalic. External right and left ear normal. Oropharynx is clear and moist.  Eyes: Conjunctivae and EOM are normal. PERRLA, no scleral icterus.  Neck: Normal ROM. Neck supple. No JVD. No tracheal deviation. No thyromegaly.  CVS: RRR, S1/S2 +, no murmurs, no gallops, no carotid bruit.  Pulmonary: Effort and breath sounds normal, no stridor, rhonchi, wheezes, rales.  Abdominal: Soft. BS +,  no distension, tenderness, rebound or guarding.  Musculoskeletal: Normal range of motion. No edema and no tenderness.  Lymphadenopathy: No lymphadenopathy noted, cervical, inguinal. Neuro: Alert. Normal reflexes, muscle tone coordination. No cranial nerve deficit. Skin: Skin is warm and dry. No rash noted. Not diaphoretic. No erythema. No pallor.  Psychiatric: Normal mood and affect. Behavior, judgment, thought content normal.   Lab Results  Component Value Date   WBC 7.2 07/16/2012   HGB 13.6 07/29/2012  HCT 40.0 07/29/2012   MCV 83.7 07/16/2012   PLT 189 07/16/2012   Lab Results  Component Value Date   CREATININE 0.53 06/14/2013   BUN 13 06/14/2013   NA 139 06/14/2013   K 4.1 06/14/2013   CL 100 06/14/2013   CO2 32 06/14/2013     No results found for this basename: HGBA1C   Lipid Panel     Component Value Date/Time   CHOL 246* 07/07/2013 1701   TRIG 474* 07/07/2013 1701   HDL 44 07/07/2013 1701   CHOLHDL 5.6 07/07/2013 1701   VLDL NOT CALC 07/07/2013 1701   LDLCALC Comment:   Not calculated due to Triglyceride >400. Suggest ordering Direct LDL (Unit Code: [phone]).   Total Cholesterol/HDL Ratio:CHD Risk                        Coronary Heart Disease Risk Table                                        Men       Women          1/2 Average Risk              3.4        3.3              Average Risk              5.0        4.4           2X Average Risk              9.6        7.1           3X Average Risk             23.4       11.0 Use the calculated Patient Ratio above and the CHD Risk table  to determine the patient's CHD Risk. ATP III Classification (LDL):       < 100        mg/dL         Optimal      100 - 129     mg/dL         Near or Above Optimal      130 - 159     mg/dL         Borderline High      160 - 189     mg/dL         High       > 190        mg/dL         Very High   07/07/2013 1701       Assessment and plan:   Patient Active Problem List   Diagnosis Date Noted  . Unspecified essential hypertension 07/07/2013  . ESSENTIAL HYPERTENSION, BENIGN 02/27/2010  . ELECTROCARDIOGRAM, ABNORMAL 02/27/2010       Hypertension All medications refilled Will check renal panel   Hypertriglyceridemia Recheck lipid panel Triglycerides were greater than 400 in October Prescribe Lopid 600 mg once a day   Gynecology referral for a Pap smear  Follow up in 3 months  The patient was given clear instructions to go to ER or return to medical center if symptoms don't improve, worsen or new problems develop.  The patient verbalized understanding. The patient was told to call to get any lab results if not heard anything in the next week.

## 2013-10-12 ENCOUNTER — Telehealth: Payer: Self-pay | Admitting: *Deleted

## 2013-10-12 NOTE — Telephone Encounter (Signed)
Message copied by Sherine Cortese, [place] R on Thu Oct 12, 2013  5:02 PM ------      Message from: Allyson Sabal MD, Ascencion Dike      Created: Tue Oct 10, 2013  2:29 PM       Patient other labs are normal, mildly increased LDL, recommend a low-fat diet ------

## 2013-10-25 ENCOUNTER — Ambulatory Visit: Payer: Self-pay | Attending: Internal Medicine

## 2013-10-30 ENCOUNTER — Ambulatory Visit: Payer: Self-pay

## 2014-01-08 ENCOUNTER — Encounter: Payer: Self-pay | Admitting: Internal Medicine

## 2014-01-08 ENCOUNTER — Ambulatory Visit: Payer: No Typology Code available for payment source | Attending: Internal Medicine | Admitting: Internal Medicine

## 2014-01-08 VITALS — BP 128/78 | HR 66 | Temp 98.5°F | Resp 16 | Ht 67.0 in | Wt 248.8 lb

## 2014-01-08 DIAGNOSIS — I10 Essential (primary) hypertension: Secondary | ICD-10-CM | POA: Insufficient documentation

## 2014-01-08 DIAGNOSIS — Z79899 Other long term (current) drug therapy: Secondary | ICD-10-CM | POA: Insufficient documentation

## 2014-01-08 DIAGNOSIS — R200 Anesthesia of skin: Secondary | ICD-10-CM

## 2014-01-08 DIAGNOSIS — E785 Hyperlipidemia, unspecified: Secondary | ICD-10-CM | POA: Insufficient documentation

## 2014-01-08 DIAGNOSIS — R209 Unspecified disturbances of skin sensation: Secondary | ICD-10-CM

## 2014-01-08 NOTE — Progress Notes (Signed)
Pt here for follow-up for HTN

## 2014-01-08 NOTE — Progress Notes (Signed)
Patient ID: Emily Dickson, female   DOB: 1956-07-25, 58 y.o.   MRN: [id number] HYPERTENSION Home BP readings: around 80/60 to 116/80 Chest Pain: occasional sharp pain, no palpitations Lightheadedness or Syncope: no Leg Swelling: no   HYPERLIPIDEMIA Muscle Aches (severe): no RUQ abdomen pain:  no  Medications When took last medication:  This morning Misses taking medications:  no  Diet Ability to limit unhealthy foods:  yes  Exercise Frequency: three times a week Type: walking  Monitoring Labs and Parameters Last A1C:  Lab Results  Component Value Date   HGBA1C 5.7 10/06/2013    Last Lipid:     Component Value Date/Time   CHOL 226* 10/06/2013 1709   HDL 57 10/06/2013 1709    Last Bmet  Potassium  Date Value Ref Range Status  10/06/2013 4.0  3.5 - 5.3 mEq/L Final     Sodium  Date Value Ref Range Status  10/06/2013 139  135 - 145 mEq/L Final     Creat  Date Value Ref Range Status  10/06/2013 0.64  0.50 - 1.10 mg/dL Final     Creatinine, Ser  Date Value Ref Range Status  08/11/2012 0.69  0.50 - 1.10 mg/dL Final      Last BPs:  BP Readings from Last 3 Encounters:  01/08/14 128/78  10/06/13 143/91  07/07/13 137/84    Weight history:  Wt Readings from Last 3 Encounters:  01/08/14 248 lb 12.8 oz (112.855 kg)  10/06/13 244 lb 6.4 oz (110.859 kg)  07/07/13 244 lb 3.2 oz (110.768 kg)   Review of Systems  Eyes: Negative for blurred vision and double vision.  Respiratory: Negative for cough and shortness of breath.   Cardiovascular: Positive for chest pain (occasional). Negative for palpitations, claudication and leg swelling.  Gastrointestinal: Negative for heartburn, nausea and abdominal pain.  Genitourinary: Negative.   All other systems reviewed and are negative.   Physical Exam  Constitutional: She is oriented to person, place, and time. She appears well-developed.  Neck: Normal range of motion.  Cardiovascular: Normal rate, regular rhythm and  normal heart sounds.   Pulmonary/Chest: Effort normal and breath sounds normal.  Abdominal: Soft. Bowel sounds are normal.  Neurological: She is alert and oriented to person, place, and time.  Skin: Skin is warm and dry.  Psychiatric: She has a normal mood and affect.   Emily Dickson was seen today for follow-up and hypertension.  Diagnoses and associated orders for this visit:  HTN (hypertension) Record BP readings and heart rate for a week, if systolic readings are below 100, call office.  Numbness - Comprehensive metabolic panel  Return in about 3 months (around 04/10/2014), or if symptoms worsen or fail to improve.  Chari Manning, NP 01/08/2014, 8:56 PM

## 2014-01-08 NOTE — Patient Instructions (Signed)
Exercise to Lose Weight Exercise and a healthy diet may help you lose weight. Your doctor may suggest specific exercises. EXERCISE IDEAS AND TIPS  Choose low-cost things you enjoy doing, such as walking, bicycling, or exercising to workout videos.  Take stairs instead of the elevator.  Walk during your lunch break.  Park your car further away from work or school.  Go to a gym or an exercise class.  Start with 5 to 10 minutes of exercise each day. Build up to 30 minutes of exercise 4 to 6 days a week.  Wear shoes with good support and comfortable clothes.  Stretch before and after working out.  Work out until you breathe harder and your heart beats faster.  Drink extra water when you exercise.  Do not do so much that you hurt yourself, feel dizzy, or get very short of breath. Exercises that burn about 150 calories:  Running 1  miles in 15 minutes.  Playing volleyball for 45 to 60 minutes.  Washing and waxing a car for 45 to 60 minutes.  Playing touch football for 45 minutes.  Walking 1  miles in 35 minutes.  Pushing a stroller 1  miles in 30 minutes.  Playing basketball for 30 minutes.  Raking leaves for 30 minutes.  Bicycling 5 miles in 30 minutes.  Walking 2 miles in 30 minutes.  Dancing for 30 minutes.  Shoveling snow for 15 minutes.  Swimming laps for 20 minutes.  Walking up stairs for 15 minutes.  Bicycling 4 miles in 15 minutes.  Gardening for 30 to 45 minutes.  Jumping rope for 15 minutes.  Washing windows or floors for 45 to 60 minutes. Document Released: 09/26/2010 Document Revised: 11/16/2011 Document Reviewed: 09/26/2010 [facility] Patient Information 2014 [place], [place]. DASH Diet The DASH diet stands for "Dietary Approaches to Stop Hypertension." It is a healthy eating plan that has been shown to reduce high blood pressure (hypertension) in as little as 14 days, while also possibly providing other significant health benefits. These other  health benefits include reducing the risk of breast cancer after menopause and reducing the risk of type 2 diabetes, heart disease, colon cancer, and stroke. Health benefits also include weight loss and slowing kidney failure in patients with chronic kidney disease.  DIET GUIDELINES  Limit salt (sodium). Your diet should contain less than 1500 mg of sodium daily.  Limit refined or processed carbohydrates. Your diet should include mostly whole grains. Desserts and added sugars should be used sparingly.  Include small amounts of heart-healthy fats. These types of fats include nuts, oils, and tub margarine. Limit saturated and trans fats. These fats have been shown to be harmful in the body. CHOOSING FOODS  The following food groups are based on a 2000 calorie diet. See your Registered Dietitian for individual calorie needs. Grains and Grain Products (6 to 8 servings daily)  Eat More Often: Whole-wheat bread, brown rice, whole-grain or wheat pasta, quinoa, popcorn without added fat or salt (air popped).  Eat Less Often: White bread, white pasta, white rice, cornbread. Vegetables (4 to 5 servings daily)  Eat More Often: Fresh, frozen, and canned vegetables. Vegetables may be raw, steamed, roasted, or grilled with a minimal amount of fat.  Eat Less Often/Avoid: Creamed or fried vegetables. Vegetables in a cheese sauce. Fruit (4 to 5 servings daily)  Eat More Often: All fresh, canned (in natural juice), or frozen fruits. Dried fruits without added sugar. One hundred percent fruit juice ( cup [237 mL] daily).  Eat Less Often: Dried fruits with added sugar. Canned fruit in light or heavy syrup. [employer], Fish, and Poultry (2 servings or less daily. One serving is 3 to 4 oz [85-114 g]).  Eat More Often: Ninety percent or leaner ground beef, tenderloin, sirloin. Round cuts of beef, chicken breast, [place] breast. All fish. Grill, bake, or broil your meat. Nothing should be fried.  Eat Less  Often/Avoid: Fatty cuts of meat, [place], or chicken leg, thigh, or wing. Fried cuts of meat or fish. Dairy (2 to 3 servings)  Eat More Often: Low-fat or fat-free milk, low-fat plain or light yogurt, reduced-fat or part-skim cheese.  Eat Less Often/Avoid: Milk (whole, 2%).Whole milk yogurt. Full-fat cheeses. Nuts, Seeds, and Legumes (4 to 5 servings per week)  Eat More Often: All without added salt.  Eat Less Often/Avoid: Salted nuts and seeds, canned beans with added salt. Fats and Sweets (limited)  Eat More Often: Vegetable oils, tub margarines without trans fats, sugar-free gelatin. Mayonnaise and salad dressings.  Eat Less Often/Avoid: Coconut oils, palm oils, butter, stick margarine, cream, half and half, cookies, candy, pie. FOR MORE INFORMATION The Dash Diet Eating Plan: www.dashdiet.org Document Released: 08/13/2011 Document Revised: 11/16/2011 Document Reviewed: 08/13/2011 [facility] Patient Information 2014 [place], [place].

## 2014-01-09 LAB — COMPREHENSIVE METABOLIC PANEL
ALBUMIN: 4.1 g/dL (ref 3.5–5.2)
ALT: 14 U/L (ref 0–35)
AST: 15 U/L (ref 0–37)
Alkaline Phosphatase: 64 U/L (ref 39–117)
BUN: 16 mg/dL (ref 6–23)
CO2: 30 mEq/L (ref 19–32)
CREATININE: 0.51 mg/dL (ref 0.50–1.10)
Calcium: 9.1 mg/dL (ref 8.4–10.5)
Chloride: 100 mEq/L (ref 96–112)
GLUCOSE: 98 mg/dL (ref 70–99)
POTASSIUM: 3.7 meq/L (ref 3.5–5.3)
Sodium: 137 mEq/L (ref 135–145)
Total Bilirubin: 0.4 mg/dL (ref 0.2–1.2)
Total Protein: 7.2 g/dL (ref 6.0–8.3)

## 2014-02-26 ENCOUNTER — Other Ambulatory Visit: Payer: Self-pay | Admitting: Internal Medicine

## 2014-02-26 DIAGNOSIS — Z1231 Encounter for screening mammogram for malignant neoplasm of breast: Secondary | ICD-10-CM

## 2014-04-09 ENCOUNTER — Ambulatory Visit (HOSPITAL_COMMUNITY)
Admission: RE | Admit: 2014-04-09 | Discharge: 2014-04-09 | Disposition: A | Discharge status: Home / Self Care | Payer: No Typology Code available for payment source | Source: Ambulatory Visit | Attending: Internal Medicine | Admitting: Internal Medicine

## 2014-04-09 DIAGNOSIS — Z1231 Encounter for screening mammogram for malignant neoplasm of breast: Secondary | ICD-10-CM

## 2014-04-16 ENCOUNTER — Encounter: Payer: Self-pay | Admitting: Internal Medicine

## 2014-04-16 ENCOUNTER — Ambulatory Visit: Payer: No Typology Code available for payment source | Attending: Internal Medicine | Admitting: Internal Medicine

## 2014-04-16 VITALS — BP 135/78 | HR 58 | Temp 99.1°F | Resp 20 | Ht 67.0 in | Wt 244.2 lb

## 2014-04-16 DIAGNOSIS — Z880 Allergy status to penicillin: Secondary | ICD-10-CM | POA: Insufficient documentation

## 2014-04-16 DIAGNOSIS — M549 Dorsalgia, unspecified: Secondary | ICD-10-CM | POA: Insufficient documentation

## 2014-04-16 DIAGNOSIS — I10 Essential (primary) hypertension: Secondary | ICD-10-CM | POA: Insufficient documentation

## 2014-04-16 MED ORDER — LISINOPRIL-HYDROCHLOROTHIAZIDE 20-12.5 MG PO TABS: 1.0000 | ORAL_TABLET | Freq: Every day | ORAL | Status: DC

## 2014-04-16 MED ORDER — AMLODIPINE BESYLATE 10 MG PO TABS: 10.0000 mg | ORAL_TABLET | Freq: Every day | ORAL | Status: DC

## 2014-04-16 MED ORDER — METOPROLOL TARTRATE 25 MG PO TABS: 25.0000 mg | ORAL_TABLET | Freq: Two times a day (BID) | ORAL | Status: DC

## 2014-04-16 NOTE — Progress Notes (Signed)
Patient ID: Emily Dickson, female   DOB: [birth year], 58 y.o.   MRN: [id number]  CC: f/u HTN  HPI:  Patient presents today as a follow up for HTN and body pain.  Patient states that she takes her BP medication daily.  She reports that since yesterday she has had pain on left shoulder, left knee, and right foot.  She has not tried anything for pain.  She reports that it is a achy pain in nature and is aggravated by walking.  The pain in her feet is located on the top of her feet and is described as achy as well.  She denies chest pain, SOB, claudication, ankle edema, vision changes, or headaches.  She denies redness or warmth in the joints.  Patient states that she has been feeling weak for the past few mornings and knows that it is related to her BP.    Allergies  Allergen Reactions  . Penicillins Rash   Past Medical History  Diagnosis Date  . Hypertension   . Back pain    Current Outpatient Prescriptions on File Prior to Visit  Medication Sig Dispense Refill  . amLODipine (NORVASC) 10 MG tablet Take 1 tablet (10 mg total) by mouth daily.  90 tablet  4  . lisinopril-hydrochlorothiazide (ZESTORETIC) 20-12.5 MG per tablet Take 1 tablet by mouth daily.  90 tablet  3  . metoprolol tartrate (LOPRESSOR) 25 MG tablet Take 1 tablet (25 mg total) by mouth 2 (two) times daily.  60 tablet  4  . gemfibrozil (LOPID) 600 MG tablet Take 1 tablet (600 mg total) by mouth AC breakfast.  30 tablet  2  . loratadine (CLARITIN) 10 MG tablet Take 1 tablet (10 mg total) by mouth daily.  30 tablet  3  . Multiple Vitamin (MULTIVITAMIN WITH MINERALS) TABS Take 1 tablet by mouth daily.      . [DISCONTINUED] lisinopril (PRINIVIL,ZESTRIL) 40 MG tablet Take 1 tablet (40 mg total) by mouth daily.  30 tablet  2   No current facility-administered medications on file prior to visit.   History reviewed. No pertinent family history. History   Social History  . Marital Status: Single    Spouse Name: N/A    Number of  Children: N/A  . Years of Education: N/A   Occupational History  . Not on file.   Social History Main Topics  . Smoking status: Never Smoker   . Smokeless tobacco: Not on file  . Alcohol Use: No  . Drug Use: No  . Sexual Activity: No   Other Topics Concern  . Not on file   Social History Narrative  . No narrative on file    Review of Systems: See HPI    Objective:   Filed Vitals:   04/16/14 1646  BP: 135/78  Pulse: 58  Temp: 99.1 F (37.3 C)  Resp: 20   Physical Exam  Neck: Normal range of motion.  Cardiovascular: Normal rate, regular rhythm and normal heart sounds.   Pulmonary/Chest: Effort normal and breath sounds normal.  Abdominal: Soft. Bowel sounds are normal.  Musculoskeletal: Normal range of motion. She exhibits no edema and no tenderness.     Lab Results  Component Value Date   WBC 5.9 10/06/2013   HGB 13.0 10/06/2013   HCT 38.9 10/06/2013   MCV 87.0 10/06/2013   PLT 211 10/06/2013   Lab Results  Component Value Date   CREATININE 0.51 01/08/2014   BUN 16 01/08/2014   NA 137 01/08/2014  K 3.7 01/08/2014   CL 100 01/08/2014   CO2 30 01/08/2014    Lab Results  Component Value Date   HGBA1C 5.7 10/06/2013   Lipid Panel     Component Value Date/Time   CHOL 226* 10/06/2013 1709   TRIG 82 10/06/2013 1709   HDL 57 10/06/2013 1709   CHOLHDL 4.0 10/06/2013 1709   VLDL 16 10/06/2013 1709   LDLCALC 153* 10/06/2013 1709       Assessment and plan:   Shirlie was seen today for follow-up and hypertension.  Diagnoses and associated orders for this visit:  Essential hypertension - lisinopril-hydrochlorothiazide (ZESTORETIC) 20-12.5 MG per tablet; Take 1 tablet by mouth daily. - metoprolol tartrate (LOPRESSOR) 25 MG tablet; Take 1 tablet (25 mg total) by mouth 2 (two) times daily.  Patient may take 1/2 of lopressor in AM to see if this helps with feeling of feeling weak and fatigued - amLODipine (NORVASC) 10 MG tablet; Take 1 tablet (10 mg total) by mouth  daily.   Return in about 1 week (around 04/23/2014) for Nurse Visit-BP/HR check  3 mo PCP.        Chari Manning, [facility] [phone] 04/16/2014, 5:19 PM

## 2014-04-16 NOTE — Progress Notes (Signed)
Patient presents for 3 month f/u on HTN Normal lab results reviewed with patient from last visit C/O pain left shoulder, left knee and right foot that began yesterday; rates 5/10 at present. Denies fall or injury

## 2014-04-23 ENCOUNTER — Ambulatory Visit: Payer: No Typology Code available for payment source | Attending: Internal Medicine | Admitting: *Deleted

## 2014-04-23 VITALS — BP 113/73 | HR 69 | Resp 16

## 2014-04-23 DIAGNOSIS — I10 Essential (primary) hypertension: Secondary | ICD-10-CM

## 2014-04-23 NOTE — Progress Notes (Signed)
Patient here for BP check. Patient states she has taken her medications as prescribed. Patient denies any headaches, dizziness, shortness of breath and chest pain.

## 2014-04-23 NOTE — Patient Instructions (Signed)

## 2014-07-23 ENCOUNTER — Telehealth: Payer: Self-pay | Admitting: Emergency Medicine

## 2014-07-23 ENCOUNTER — Telehealth: Payer: Self-pay | Admitting: Internal Medicine

## 2014-07-23 NOTE — Telephone Encounter (Signed)
Pt. Called stating that she is still having problems with her blood pressure medication, pt. States that her fingers arer still tingling Home: [phone]

## 2014-07-23 NOTE — Telephone Encounter (Signed)
Pt c/o tingling sensation in both hands x 1 week BP at home 123/82 80 Pt given scheduled ov 08/01/14 @ 2pm with Mateo Flow

## 2014-08-01 ENCOUNTER — Ambulatory Visit (HOSPITAL_BASED_OUTPATIENT_CLINIC_OR_DEPARTMENT_OTHER): Payer: Self-pay | Admitting: *Deleted

## 2014-08-01 ENCOUNTER — Ambulatory Visit: Payer: Self-pay | Attending: Internal Medicine | Admitting: Internal Medicine

## 2014-08-01 ENCOUNTER — Encounter: Payer: Self-pay | Admitting: Internal Medicine

## 2014-08-01 VITALS — BP 116/76 | HR 82 | Temp 98.4°F | Resp 18 | Ht 67.0 in | Wt 240.0 lb

## 2014-08-01 DIAGNOSIS — Z23 Encounter for immunization: Secondary | ICD-10-CM

## 2014-08-01 DIAGNOSIS — I10 Essential (primary) hypertension: Secondary | ICD-10-CM | POA: Insufficient documentation

## 2014-08-01 DIAGNOSIS — J309 Allergic rhinitis, unspecified: Secondary | ICD-10-CM | POA: Insufficient documentation

## 2014-08-01 MED ORDER — LORATADINE 10 MG PO TABS: 10.0000 mg | ORAL_TABLET | Freq: Every day | ORAL | Status: DC

## 2014-08-01 MED ORDER — LISINOPRIL-HYDROCHLOROTHIAZIDE 20-12.5 MG PO TABS: 1.0000 | ORAL_TABLET | Freq: Every day | ORAL | Status: DC

## 2014-08-01 MED ORDER — AMLODIPINE BESYLATE 10 MG PO TABS: 10.0000 mg | ORAL_TABLET | Freq: Every day | ORAL | Status: DC

## 2014-08-01 MED ORDER — FLUTICASONE PROPIONATE 50 MCG/ACT NA SUSP: 2.0000 | Freq: Every day | NASAL | Status: DC

## 2014-08-01 NOTE — Progress Notes (Signed)
F/U HTN  Pt stated stop taking Rx Metoprolol two weeks ago Medication was  making her dizzily.

## 2014-08-01 NOTE — Patient Instructions (Signed)

## 2014-08-01 NOTE — Progress Notes (Signed)
Patient ID: Emily Dickson, female   DOB: 08/15/1956, 58 y.o.   MRN: [id number]  CC:  HTN follow up  HPI:  Patient presents to clinic today for a follow up of hypertension.  She states that she stopped taking metoprolol 2 weeks ago due to feeling dizzy.  She reports that with the dizziness she has had tingling in her hands and thought that it may be d/t low potassium.  She reports that she has continued to take the amlodipine and lisinopril-HCTZ.  She reports that she has had resolution in dizziness and improvement in tingling since discontinuing metoprolol.   Allergies  Allergen Reactions  . Penicillins Rash   Past Medical History  Diagnosis Date  . Hypertension   . Back pain    Current Outpatient Prescriptions on File Prior to Visit  Medication Sig Dispense Refill  . amLODipine (NORVASC) 10 MG tablet Take 1 tablet (10 mg total) by mouth daily. 90 tablet 4  . lisinopril-hydrochlorothiazide (ZESTORETIC) 20-12.5 MG per tablet Take 1 tablet by mouth daily. 90 tablet 3  . loratadine (CLARITIN) 10 MG tablet Take 1 tablet (10 mg total) by mouth daily. 30 tablet 3  . Multiple Vitamin (MULTIVITAMIN WITH MINERALS) TABS Take 1 tablet by mouth daily.    Marland Kitchen gemfibrozil (LOPID) 600 MG tablet Take 1 tablet (600 mg total) by mouth AC breakfast. (Patient not taking: Reported on 08/01/2014) 30 tablet 2  . metoprolol tartrate (LOPRESSOR) 25 MG tablet Take 1 tablet (25 mg total) by mouth 2 (two) times daily. (Patient not taking: Reported on 08/01/2014) 60 tablet 4  . [DISCONTINUED] lisinopril (PRINIVIL,ZESTRIL) 40 MG tablet Take 1 tablet (40 mg total) by mouth daily. 30 tablet 2   No current facility-administered medications on file prior to visit.   History reviewed. No pertinent family history. History   Social History  . Marital Status: Single    Spouse Name: N/A    Number of Children: N/A  . Years of Education: N/A   Occupational History  . Not on file.   Social History Main Topics  .  Smoking status: Never Smoker   . Smokeless tobacco: Not on file  . Alcohol Use: No  . Drug Use: No  . Sexual Activity: No   Other Topics Concern  . Not on file   Social History Narrative    Review of Systems  HENT: Negative.   Eyes: Negative for blurred vision, pain and discharge.       Eye itching   Respiratory: Negative.   Cardiovascular: Negative.   Neurological: Positive for dizziness.     Objective:   Filed Vitals:   08/01/14 1411  BP: 116/76  Pulse: 82  Temp: 98.4 F (36.9 C)  Resp: 18    Physical Exam  Constitutional: She is oriented to person, place, and time.  HENT:  Right Ear: External ear normal.  Left Ear: External ear normal.  Mouth/Throat: Oropharynx is clear and moist.  Eyes: Conjunctivae are normal. Pupils are equal, round, and reactive to light. Right eye exhibits no discharge. Left eye exhibits no discharge.  Cardiovascular: Normal rate, regular rhythm, normal heart sounds and intact distal pulses.   Pulmonary/Chest: Effort normal and breath sounds normal.  Musculoskeletal: Normal range of motion. She exhibits edema (trace edema).  Lymphadenopathy:    She has no cervical adenopathy.  Neurological: She is alert and oriented to person, place, and time.  Skin: Skin is warm and dry.     Lab Results  Component Value Date  WBC 5.9 10/06/2013   HGB 13.0 10/06/2013   HCT 38.9 10/06/2013   MCV 87.0 10/06/2013   PLT 211 10/06/2013   Lab Results  Component Value Date   CREATININE 0.51 01/08/2014   BUN 16 01/08/2014   NA 137 01/08/2014   K 3.7 01/08/2014   CL 100 01/08/2014   CO2 30 01/08/2014    Lab Results  Component Value Date   HGBA1C 5.7 10/06/2013   Lipid Panel     Component Value Date/Time   CHOL 226* 10/06/2013 1709   TRIG 82 10/06/2013 1709   HDL 57 10/06/2013 1709   CHOLHDL 4.0 10/06/2013 1709   VLDL 16 10/06/2013 1709   LDLCALC 153* 10/06/2013 1709       Assessment and plan:   Emily Dickson was seen today for  follow-up and hypertension.  Diagnoses and associated orders for this visit:  Essential hypertension - Continue amLODipine (NORVASC) 10 MG tablet; Take 1 tablet (10 mg total) by mouth daily. - Continue lisinopril-hydrochlorothiazide (ZESTORETIC) 20-12.5 MG per tablet; Take 1 tablet by mouth daily. - COMPLETE METABOLIC PANEL WITH GFR Patient blood pressure is stable and may continue on current medication.  Education on diet, exercise, and modifiable risk factors discussed. Will obtain appropriate labs as needed. Will follow up in 3-6 months.   Allergic rhinitis, unspecified allergic rhinitis type - loratadine (CLARITIN) 10 MG tablet; Take 1 tablet (10 mg total) by mouth daily. - fluticasone (FLONASE) 50 MCG/ACT nasal spray; Place 2 sprays into both nostrils daily.  Return in about 3 weeks (around 08/22/2014) for Nurse Visit-BP check and 3 mo PCP.  Will complete lipid panel on next .  If BP remains controlled at next visit she may continue to stay off metoprolol.     Chari Manning, NP-C [facility] [phone] 08/01/2014, 2:17 PM

## 2014-08-02 LAB — COMPLETE METABOLIC PANEL WITH GFR
ALBUMIN: 4.1 g/dL (ref 3.5–5.2)
ALT: 26 U/L (ref 0–35)
AST: 20 U/L (ref 0–37)
Alkaline Phosphatase: 74 U/L (ref 39–117)
BUN: 8 mg/dL (ref 6–23)
CALCIUM: 9.5 mg/dL (ref 8.4–10.5)
CO2: 32 meq/L (ref 19–32)
Chloride: 99 mEq/L (ref 96–112)
Creat: 0.53 mg/dL (ref 0.50–1.10)
GFR, Est Non African American: >89 mL/min
Glucose, Bld: 79 mg/dL (ref 70–99)
POTASSIUM: 3.9 meq/L (ref 3.5–5.3)
SODIUM: 140 meq/L (ref 135–145)
TOTAL PROTEIN: 7.3 g/dL (ref 6.0–8.3)
Total Bilirubin: 0.3 mg/dL (ref 0.2–1.2)

## 2014-08-23 ENCOUNTER — Ambulatory Visit: Payer: Self-pay | Attending: Internal Medicine | Admitting: *Deleted

## 2014-08-23 VITALS — BP 144/90 | HR 69 | Temp 98.0°F | Resp 18 | Ht 67.0 in | Wt 241.0 lb

## 2014-08-23 DIAGNOSIS — Z0130 Encounter for examination of blood pressure without abnormal findings: Secondary | ICD-10-CM | POA: Insufficient documentation

## 2014-08-23 DIAGNOSIS — Z0000 Encounter for general adult medical examination without abnormal findings: Secondary | ICD-10-CM

## 2014-08-23 LAB — LIPID PANEL
CHOLESTEROL: 199 mg/dL (ref 0–200)
HDL: 52 mg/dL (ref >39)
LDL Cholesterol: 136 mg/dL — ABNORMAL HIGH (ref 0–99)
TRIGLYCERIDES: 57 mg/dL (ref <150)
Total CHOL/HDL Ratio: 3.8 Ratio
VLDL: 11 mg/dL (ref 0–40)

## 2014-08-23 NOTE — Progress Notes (Signed)
Reviewed with patient at today's nurse visit

## 2014-08-23 NOTE — Progress Notes (Signed)
Patient presents for BP check and fasting lab Med list reviewed; states taking all meds as directed, however, did not take today's doses of BP meds because she was fasting for blood work Patient aware of lab result from last OV  BP 144/90  P 69 R 18  T  98 Oral  SPO2 96%  Patient to return in 2 weeks for nurse visit for BP check Patient instructed to take BP meds morning of next appt  Patient advised to call for med refills at least 7 days before running out so as not to go without. Patient aware that she is to f/u with PCP 3 months from last visit (Due 11/01/14)

## 2014-08-28 ENCOUNTER — Telehealth: Payer: Self-pay | Admitting: *Deleted

## 2014-08-28 MED ORDER — ATORVASTATIN CALCIUM 10 MG PO TABS: 10.0000 mg | ORAL_TABLET | Freq: Every day | ORAL | Status: DC

## 2014-08-28 NOTE — Telephone Encounter (Signed)
Left message on patient's VM to return call to discuss lab results Atorvastatin e-scribed to [facility]

## 2014-08-28 NOTE — Telephone Encounter (Signed)
-----   Message from Lance Bosch, NP sent at 08/28/2014  1:35 PM EST ----- Cholesterol slightly elevated. Please provide appropriate education regarding diet and exercise.    Send Atorvastatin 10 mg to take daily.  Send 3 refills.  Will repeat lipid [anel in 6 months

## 2014-09-03 NOTE — Telephone Encounter (Signed)
Pt returning nurse's call regarding results, please f/u with pt. Please call pt on [phone].

## 2014-09-17 ENCOUNTER — Ambulatory Visit: Payer: Self-pay | Attending: Internal Medicine

## 2014-09-17 ENCOUNTER — Ambulatory Visit: Payer: No Typology Code available for payment source | Admitting: *Deleted

## 2014-09-17 VITALS — BP 138/77 | HR 68 | Temp 98.1°F | Resp 16

## 2014-09-17 DIAGNOSIS — I10 Essential (primary) hypertension: Secondary | ICD-10-CM | POA: Insufficient documentation

## 2014-09-17 DIAGNOSIS — E785 Hyperlipidemia, unspecified: Secondary | ICD-10-CM | POA: Insufficient documentation

## 2014-09-17 NOTE — Progress Notes (Signed)
Patient presents for BP check Med list reviewed; states taking all meds as directed, however, was not aware of lipitor. Reviewed labs with patient at today's nurse visit. Patient will pick up lipitor today and begin today. Also, patient states stopped metoprolol 5 months ago due to dizziness. Was told at last office visit that she may stay off metoprolol if BP is in acceptable range States walking on treadmill 15-20 minutes 4 days/week Discussed need for low sodium diet and using Mrs. Dash as alternative to salt  BP 138/77 P 68 R 16  T  98.1 oral SPO2  98%  Patient provided information Fat and Cholesterol Control Diet  Patient advised to call for med refills at least 7 days before running out so as not to go without. Patient aware that she is to f/u with PCP 3 months from last visit (Due 11/01/14)

## 2014-09-17 NOTE — Patient Instructions (Signed)
Fat and Cholesterol Control Diet Fat and cholesterol levels in your blood and organs are influenced by your diet. High levels of fat and cholesterol may lead to diseases of the heart, small and large blood vessels, gallbladder, liver, and pancreas. CONTROLLING FAT AND CHOLESTEROL WITH DIET Although exercise and lifestyle factors are important, your diet is key. That is because certain foods are known to raise cholesterol and others to lower it. The goal is to balance foods for their effect on cholesterol and more importantly, to replace saturated and trans fat with other types of fat, such as monounsaturated fat, polyunsaturated fat, and omega-3 fatty acids. On average, a person should consume no more than 15 to 17 g of saturated fat daily. Saturated and trans fats are considered "bad" fats, and they will raise LDL cholesterol. Saturated fats are primarily found in animal products such as meats, butter, and cream. However, that does not mean you need to give up all your favorite foods. Today, there are good tasting, low-fat, low-cholesterol substitutes for most of the things you like to eat. Choose low-fat or nonfat alternatives. Choose round or loin cuts of red meat. These types of cuts are lowest in fat and cholesterol. Chicken (without the skin), fish, veal, and ground turkey breast are great choices. Eliminate fatty meats, such as hot dogs and salami. Even shellfish have little or no saturated fat. Have a 3 oz (85 g) portion when you eat lean meat, poultry, or fish. Trans fats are also called "partially hydrogenated oils." They are oils that have been scientifically manipulated so that they are solid at room temperature resulting in a longer shelf life and improved taste and texture of foods in which they are added. Trans fats are found in stick margarine, some tub margarines, cookies, crackers, and baked goods.  When baking and cooking, oils are a great substitute for butter. The monounsaturated oils are  especially beneficial since it is believed they lower LDL and raise HDL. The oils you should avoid entirely are saturated tropical oils, such as coconut and palm.  Remember to eat a lot from food groups that are naturally free of saturated and trans fat, including fish, fruit, vegetables, beans, grains (barley, rice, couscous, bulgur wheat), and pasta (without cream sauces).  IDENTIFYING FOODS THAT LOWER FAT AND CHOLESTEROL  Soluble fiber may lower your cholesterol. This type of fiber is found in fruits such as apples, vegetables such as broccoli, potatoes, and carrots, legumes such as beans, peas, and lentils, and grains such as barley. Foods fortified with plant sterols (phytosterol) may also lower cholesterol. You should eat at least 2 g per day of these foods for a cholesterol lowering effect.  Read package labels to identify low-saturated fats, trans fat free, and low-fat foods at the supermarket. Select cheeses that have only 2 to 3 g saturated fat per ounce. Use a heart-healthy tub margarine that is free of trans fats or partially hydrogenated oil. When buying baked goods (cookies, crackers), avoid partially hydrogenated oils. Breads and muffins should be made from whole grains (whole-wheat or whole oat flour, instead of "flour" or "enriched flour"). Buy non-creamy canned soups with reduced salt and no added fats.  FOOD PREPARATION TECHNIQUES  Never deep-fry. If you must fry, either stir-fry, which uses very little fat, or use non-stick cooking sprays. When possible, broil, bake, or roast meats, and steam vegetables. Instead of putting butter or margarine on vegetables, use lemon and herbs, applesauce, and cinnamon (for squash and sweet potatoes). Use nonfat   yogurt, salsa, and low-fat dressings for salads.  LOW-SATURATED FAT / LOW-FAT FOOD SUBSTITUTES Meats / Saturated Fat (g)  Avoid: Steak, marbled (3 oz/85 g) / 11 g  Choose: Steak, lean (3 oz/85 g) / 4 g  Avoid: Hamburger (3 oz/85 g) / 7  g  Choose: Hamburger, lean (3 oz/85 g) / 5 g  Avoid: Ham (3 oz/85 g) / 6 g  Choose: Ham, lean cut (3 oz/85 g) / 2.4 g  Avoid: Chicken, with skin, dark meat (3 oz/85 g) / 4 g  Choose: Chicken, skin removed, dark meat (3 oz/85 g) / 2 g  Avoid: Chicken, with skin, light meat (3 oz/85 g) / 2.5 g  Choose: Chicken, skin removed, light meat (3 oz/85 g) / 1 g Dairy / Saturated Fat (g)  Avoid: Whole milk (1 cup) / 5 g  Choose: Low-fat milk, 2% (1 cup) / 3 g  Choose: Low-fat milk, 1% (1 cup) / 1.5 g  Choose: Skim milk (1 cup) / 0.3 g  Avoid: Hard cheese (1 oz/28 g) / 6 g  Choose: Skim milk cheese (1 oz/28 g) / 2 to 3 g  Avoid: Cottage cheese, 4% fat (1 cup) / 6.5 g  Choose: Low-fat cottage cheese, 1% fat (1 cup) / 1.5 g  Avoid: Ice cream (1 cup) / 9 g  Choose: Sherbet (1 cup) / 2.5 g  Choose: Nonfat frozen yogurt (1 cup) / 0.3 g  Choose: Frozen fruit bar / trace  Avoid: Whipped cream (1 tbs) / 3.5 g  Choose: Nondairy whipped topping (1 tbs) / 1 g Condiments / Saturated Fat (g)  Avoid: Mayonnaise (1 tbs) / 2 g  Choose: Low-fat mayonnaise (1 tbs) / 1 g  Avoid: Butter (1 tbs) / 7 g  Choose: Extra light margarine (1 tbs) / 1 g  Avoid: Coconut oil (1 tbs) / 11.8 g  Choose: Olive oil (1 tbs) / 1.8 g  Choose: Corn oil (1 tbs) / 1.7 g  Choose: Safflower oil (1 tbs) / 1.2 g  Choose: Sunflower oil (1 tbs) / 1.4 g  Choose: Soybean oil (1 tbs) / 2.4 g  Choose: Canola oil (1 tbs) / 1 g Document Released: 08/24/2005 Document Revised: 12/19/2012 Document Reviewed: 11/22/2013 [employer] Patient Information 2015 [employer]. This information is not intended to replace advice given to you by your health care provider. Make sure you discuss any questions you have with your health care provider.  

## 2014-11-08 ENCOUNTER — Encounter: Payer: Self-pay | Admitting: Internal Medicine

## 2014-11-08 ENCOUNTER — Ambulatory Visit: Payer: Self-pay | Attending: Internal Medicine | Admitting: Internal Medicine

## 2014-11-08 VITALS — BP 130/81 | HR 81 | Temp 99.0°F | Resp 16 | Ht 67.0 in | Wt 250.0 lb

## 2014-11-08 DIAGNOSIS — I10 Essential (primary) hypertension: Secondary | ICD-10-CM

## 2014-11-08 DIAGNOSIS — Z6839 Body mass index (BMI) 39.0-39.9, adult: Secondary | ICD-10-CM | POA: Insufficient documentation

## 2014-11-08 DIAGNOSIS — E785 Hyperlipidemia, unspecified: Secondary | ICD-10-CM

## 2014-11-08 DIAGNOSIS — E663 Overweight: Secondary | ICD-10-CM | POA: Insufficient documentation

## 2014-11-08 MED ORDER — AMLODIPINE BESYLATE 10 MG PO TABS: 10.0000 mg | ORAL_TABLET | Freq: Every day | ORAL | Status: DC

## 2014-11-08 MED ORDER — ATORVASTATIN CALCIUM 10 MG PO TABS: 10.0000 mg | ORAL_TABLET | Freq: Every day | ORAL | Status: DC

## 2014-11-08 MED ORDER — LISINOPRIL-HYDROCHLOROTHIAZIDE 20-12.5 MG PO TABS: 1.0000 | ORAL_TABLET | Freq: Every day | ORAL | Status: DC

## 2014-11-08 NOTE — Progress Notes (Signed)
Pt is here following up on her HTN. Pt has no C.C. today

## 2014-11-08 NOTE — Patient Instructions (Signed)
Exercise to Lose Weight Exercise and a healthy diet may help you lose weight. Your doctor may suggest specific exercises. EXERCISE IDEAS AND TIPS  Choose low-cost things you enjoy doing, such as walking, bicycling, or exercising to workout videos.  Take stairs instead of the elevator.  Walk during your lunch break.  Park your car further away from work or school.  Go to a gym or an exercise class.  Start with 5 to 10 minutes of exercise each day. Build up to 30 minutes of exercise 4 to 6 days a week.  Wear shoes with good support and comfortable clothes.  Stretch before and after working out.  Work out until you breathe harder and your heart beats faster.  Drink extra water when you exercise.  Do not do so much that you hurt yourself, feel dizzy, or get very short of breath. Exercises that burn about 150 calories:  Running 1  miles in 15 minutes.  Playing volleyball for 45 to 60 minutes.  Washing and waxing a car for 45 to 60 minutes.  Playing touch football for 45 minutes.  Walking 1  miles in 35 minutes.  Pushing a stroller 1  miles in 30 minutes.  Playing basketball for 30 minutes.  Raking leaves for 30 minutes.  Bicycling 5 miles in 30 minutes.  Walking 2 miles in 30 minutes.  Dancing for 30 minutes.  Shoveling snow for 15 minutes.  Swimming laps for 20 minutes.  Walking up stairs for 15 minutes.  Bicycling 4 miles in 15 minutes.  Gardening for 30 to 45 minutes.  Jumping rope for 15 minutes.  Washing windows or floors for 45 to 60 minutes. Document Released: 09/26/2010 Document Revised: 11/16/2011 Document Reviewed: 09/26/2010 Exit[employer] Patient Information 2015 [employer]. This information is not intended to replace advice given to you by your health care provider. Make sure you discuss any questions you have with your health care provider.  

## 2014-11-08 NOTE — Progress Notes (Signed)
Patient ID: Emily Dickson, female   DOB: 1956/01/03, 59 y.o.   MRN: [id number] Subjective:  Emily Dickson is a 59 y.o. female with hypertension. Current Outpatient Prescriptions  Medication Sig Dispense Refill  . amLODipine (NORVASC) 10 MG tablet Take 1 tablet (10 mg total) by mouth daily. 90 tablet 4  . atorvastatin (LIPITOR) 10 MG tablet Take 1 tablet (10 mg total) by mouth daily. 30 tablet 3  . cholecalciferol (VITAMIN D) 1000 UNITS tablet Take 1,000 Units by mouth daily.    . fluticasone (FLONASE) 50 MCG/ACT nasal spray Place 2 sprays into both nostrils daily. 16 g 6  . lisinopril-hydrochlorothiazide (ZESTORETIC) 20-12.5 MG per tablet Take 1 tablet by mouth daily. 90 tablet 3  . loratadine (CLARITIN) 10 MG tablet Take 1 tablet (10 mg total) by mouth daily. 30 tablet 3  . Multiple Vitamin (MULTIVITAMIN WITH MINERALS) TABS Take 1 tablet by mouth daily.    . metoprolol tartrate (LOPRESSOR) 25 MG tablet Take 1 tablet (25 mg total) by mouth 2 (two) times daily. (Patient not taking: Reported on 08/01/2014) 60 tablet 4  . [DISCONTINUED] lisinopril (PRINIVIL,ZESTRIL) 40 MG tablet Take 1 tablet (40 mg total) by mouth daily. 30 tablet 2   No current facility-administered medications for this visit.    Hypertension ROS: taking medications as instructed, no medication side effects noted, no TIA's, no chest pain on exertion, no dyspnea on exertion, no swelling of ankles and no palpitations.  New concerns: none.   Objective:  BP 130/81 mmHg  Pulse 81  Temp(Src) 99 F (37.2 C) (Oral)  Resp 16  Ht 5\' 7"  (1.702 m)  Wt 250 lb (113.399 kg)  BMI 39.15 kg/m2  SpO2 98%  Appearance alert, well appearing, and in no distress, oriented to person, place, and time and overweight. General exam BP noted to be well controlled today in office, S1, S2 normal, no gallop, no murmur, chest clear, no JVD, no HSM, no edema, CVS exam  - normal rate, regular rhythm, normal S1, S2, no murmurs, rubs, clicks or  gallops, normal bilateral carotid upstroke without bruits, no JVD.  Lab review: labs are reviewed, up to date and normal.   Assessment:   Hypertension well controlled.   Plan:  Current treatment plan is effective, no change in therapy. Follow up: 6 months and as needed.Marland Kitchen  Chari Manning, NP 11/08/2014 4:20 PM

## 2015-03-19 ENCOUNTER — Other Ambulatory Visit: Payer: Self-pay | Admitting: Internal Medicine

## 2015-03-19 DIAGNOSIS — Z1231 Encounter for screening mammogram for malignant neoplasm of breast: Secondary | ICD-10-CM

## 2015-03-29 ENCOUNTER — Ambulatory Visit: Payer: Self-pay | Attending: Internal Medicine

## 2015-04-04 ENCOUNTER — Encounter: Payer: Self-pay | Admitting: Internal Medicine

## 2015-04-04 ENCOUNTER — Ambulatory Visit: Payer: Self-pay | Attending: Internal Medicine | Admitting: Internal Medicine

## 2015-04-04 VITALS — BP 117/76 | HR 76 | Temp 98.0°F | Resp 16 | Wt 248.0 lb

## 2015-04-04 DIAGNOSIS — E785 Hyperlipidemia, unspecified: Secondary | ICD-10-CM

## 2015-04-04 DIAGNOSIS — J309 Allergic rhinitis, unspecified: Secondary | ICD-10-CM

## 2015-04-04 DIAGNOSIS — I10 Essential (primary) hypertension: Secondary | ICD-10-CM

## 2015-04-04 LAB — BASIC METABOLIC PANEL
BUN: 11 mg/dL (ref 7–25)
CO2: 29 mEq/L (ref 20–31)
Calcium: 9.6 mg/dL (ref 8.6–10.4)
Chloride: 100 mEq/L (ref 98–110)
Creat: 0.52 mg/dL (ref 0.50–1.05)
Glucose, Bld: 77 mg/dL (ref 65–99)
Potassium: 3.7 mEq/L (ref 3.5–5.3)
Sodium: 141 mEq/L (ref 135–146)

## 2015-04-04 MED ORDER — AMLODIPINE BESYLATE 10 MG PO TABS: 10.0000 mg | ORAL_TABLET | Freq: Every day | ORAL | Status: DC

## 2015-04-04 MED ORDER — LISINOPRIL-HYDROCHLOROTHIAZIDE 20-12.5 MG PO TABS: 1.0000 | ORAL_TABLET | Freq: Every day | ORAL | Status: DC

## 2015-04-04 MED ORDER — FLUTICASONE PROPIONATE 50 MCG/ACT NA SUSP: 2.0000 | Freq: Every day | NASAL | Status: DC

## 2015-04-04 MED ORDER — ATORVASTATIN CALCIUM 10 MG PO TABS: 10.0000 mg | ORAL_TABLET | Freq: Every day | ORAL | Status: DC

## 2015-04-04 NOTE — Progress Notes (Addendum)
Patient ID: Emily Dickson, female   DOB: [birth year], 59 y.o.   MRN: [id number] Subjective:  Emily Dickson is a 59 y.o. female with hypertension here for a 6 month follow up.   Current Outpatient Prescriptions  Medication Sig Dispense Refill  . amLODipine (NORVASC) 10 MG tablet Take 1 tablet (10 mg total) by mouth daily. 30 tablet 4  . atorvastatin (LIPITOR) 10 MG tablet Take 1 tablet (10 mg total) by mouth daily. 30 tablet 4  . cholecalciferol (VITAMIN D) 1000 UNITS tablet Take 1,000 Units by mouth daily.    . fluticasone (FLONASE) 50 MCG/ACT nasal spray Place 2 sprays into both nostrils daily. 16 g 6  . lisinopril-hydrochlorothiazide (ZESTORETIC) 20-12.5 MG per tablet Take 1 tablet by mouth daily. 30 tablet 4  . loratadine (CLARITIN) 10 MG tablet Take 1 tablet (10 mg total) by mouth daily. 30 tablet 3  . metoprolol tartrate (LOPRESSOR) 25 MG tablet Take 1 tablet (25 mg total) by mouth 2 (two) times daily. (Patient not taking: Reported on 08/01/2014) 60 tablet 4  . Multiple Vitamin (MULTIVITAMIN WITH MINERALS) TABS Take 1 tablet by mouth daily.    . [DISCONTINUED] lisinopril (PRINIVIL,ZESTRIL) 40 MG tablet Take 1 tablet (40 mg total) by mouth daily. 30 tablet 2   No current facility-administered medications for this visit.    Hypertension ROS: taking medications as instructed, no medication side effects noted, no TIA's, no chest pain on exertion, no dyspnea on exertion, no swelling of ankles, no orthostatic dizziness or lightheadedness and no palpitations. No claudications, myalgias. New concerns: Would like medication refills    Objective:  BP 117/76 mmHg  Pulse 76  Temp(Src) 98 F (36.7 C)  Resp 16  Wt 248 lb (112.492 kg)  SpO2 100%  Appearance alert, well appearing, and in no distress, oriented to person, place, and time and overweight. General exam BP noted to be well controlled today in office, S1, S2 normal, no gallop, no murmur, chest clear, no JVD, no HSM, no edema, no  JVD.   Lab review: most recent lipid panel reviewed, showing LDL result does not yet meet goal, triglycerides normal, liver functions are normal, Will check BMET today today to assess potassium and kidney function.   Assessment:   Hypertension well controlled and needs to follow diet more regularly.  HLD: stable, continue lipitor. Will recheck in 6 months  Allergic rhinitis: stable, refill flonase  Plan:  Current treatment plan is effective, no change in therapy. Reviewed diet, exercise and weight control. Recommended sodium restriction. Copy of written low fat low cholesterol diet provided and reviewed. Cardiovascular risk and specific lipid/LDL goals reviewed.   Return in about 2 weeks (around 04/18/2015) for pap and 6 mo PCP HTN.    Lance Bosch, NP 1:36 PM 04/09/2015

## 2015-04-04 NOTE — Progress Notes (Signed)
Patient here for routine follow up on her Hypertension Patient states recently filled her medications but will need Additional refills

## 2015-04-08 ENCOUNTER — Telehealth: Payer: Self-pay

## 2015-04-08 NOTE — Telephone Encounter (Signed)
-----   Message from Lance Bosch, NP sent at 04/07/2015  8:39 PM EDT ----- Labs are normal

## 2015-04-08 NOTE — Telephone Encounter (Signed)
Nurse called patient, reached voicemail. Left message for patient to call Ethan Clayburn with [facility], at [phone].  

## 2015-04-11 ENCOUNTER — Ambulatory Visit (HOSPITAL_COMMUNITY)
Admission: RE | Admit: 2015-04-11 | Discharge: 2015-04-11 | Disposition: A | Discharge status: Home / Self Care | Payer: Self-pay | Source: Ambulatory Visit | Attending: Internal Medicine | Admitting: Internal Medicine

## 2015-04-11 DIAGNOSIS — Z1231 Encounter for screening mammogram for malignant neoplasm of breast: Secondary | ICD-10-CM | POA: Insufficient documentation

## 2015-04-15 ENCOUNTER — Telehealth: Payer: Self-pay | Admitting: Internal Medicine

## 2015-04-15 NOTE — Telephone Encounter (Signed)
-----   Message from Nila Nephew, RN sent at 04/15/2015 12:39 PM EDT -----   ----- Message -----    From: Lance Bosch, NP    Sent: 04/13/2015   9:44 PM      To: Nila Nephew, RN  Mammogram is negative for malignancies. Will repeat in one year

## 2015-04-15 NOTE — Telephone Encounter (Signed)
Pt is returning Brandon call. Please follow up with patient. Thank you.

## 2015-04-15 NOTE — Telephone Encounter (Signed)
Called patient. Reached voicemail. Left message stating to return call at [phone].

## 2015-04-16 NOTE — Telephone Encounter (Signed)
Returned patient call. Reached voicemail. Left message stating for patient to return call at [phone].

## 2015-04-18 ENCOUNTER — Encounter: Payer: Self-pay | Admitting: Internal Medicine

## 2015-04-18 ENCOUNTER — Ambulatory Visit: Payer: Self-pay | Attending: Internal Medicine | Admitting: Internal Medicine

## 2015-04-18 ENCOUNTER — Other Ambulatory Visit (HOSPITAL_COMMUNITY)
Admission: RE | Admit: 2015-04-18 | Discharge: 2015-04-18 | Disposition: A | Discharge status: Home / Self Care | Payer: Self-pay | Source: Ambulatory Visit | Attending: Internal Medicine | Admitting: Internal Medicine

## 2015-04-18 ENCOUNTER — Telehealth: Payer: Self-pay

## 2015-04-18 VITALS — BP 146/85 | HR 69 | Temp 98.0°F | Resp 16 | Ht 67.0 in | Wt 250.0 lb

## 2015-04-18 DIAGNOSIS — Z880 Allergy status to penicillin: Secondary | ICD-10-CM | POA: Insufficient documentation

## 2015-04-18 DIAGNOSIS — I10 Essential (primary) hypertension: Secondary | ICD-10-CM | POA: Insufficient documentation

## 2015-04-18 DIAGNOSIS — Z113 Encounter for screening for infections with a predominantly sexual mode of transmission: Secondary | ICD-10-CM | POA: Insufficient documentation

## 2015-04-18 DIAGNOSIS — N760 Acute vaginitis: Secondary | ICD-10-CM | POA: Insufficient documentation

## 2015-04-18 DIAGNOSIS — Z0000 Encounter for general adult medical examination without abnormal findings: Secondary | ICD-10-CM | POA: Insufficient documentation

## 2015-04-18 DIAGNOSIS — Z79899 Other long term (current) drug therapy: Secondary | ICD-10-CM | POA: Insufficient documentation

## 2015-04-18 DIAGNOSIS — Z1151 Encounter for screening for human papillomavirus (HPV): Secondary | ICD-10-CM | POA: Insufficient documentation

## 2015-04-18 DIAGNOSIS — Z01419 Encounter for gynecological examination (general) (routine) without abnormal findings: Secondary | ICD-10-CM | POA: Insufficient documentation

## 2015-04-18 LAB — CBC
HEMATOCRIT: 35.9 % — AB (ref 36.0–46.0)
Hemoglobin: 12.2 g/dL (ref 12.0–15.0)
MCH: 29.9 pg (ref 26.0–34.0)
MCHC: 34 g/dL (ref 30.0–36.0)
MCV: 88 fL (ref 78.0–100.0)
MPV: 10.7 fL (ref 8.6–12.4)
PLATELETS: 221 10*3/uL (ref 150–400)
RBC: 4.08 MIL/uL (ref 3.87–5.11)
RDW: 14.5 % (ref 11.5–15.5)
WBC: 4.6 10*3/uL (ref 4.0–10.5)

## 2015-04-18 LAB — LIPID PANEL
Cholesterol: 213 mg/dL — ABNORMAL HIGH (ref 125–200)
HDL: 77 mg/dL (ref >=46)
LDL Cholesterol: 121 mg/dL (ref <130)
Total CHOL/HDL Ratio: 2.8 Ratio (ref <=5.0)
Triglycerides: 73 mg/dL (ref <150)
VLDL: 15 mg/dL (ref <30)

## 2015-04-18 LAB — TSH: TSH: 1.702 u[IU]/mL (ref 0.350–4.500)

## 2015-04-18 NOTE — Progress Notes (Signed)
Patient states she is here for blood work and for a yearly Pap

## 2015-04-18 NOTE — Telephone Encounter (Signed)
Called patient to verify is she received tdap vaccine at her visit Patient states she did not receive it and will get it at her next visit

## 2015-04-18 NOTE — Progress Notes (Signed)
Patient ID: Emily Dickson, female   DOB: [birth year], 59 y.o.   MRN: [id number]  CC: annual exam  HPI: Emily Dickson is a 59 y.o. female here today for a annual physical.  Patient has past medical history of hypertension. Patient reports that she is up to date on mammogram and has never had a abnormal pap. She denies vaginal discharge, itch, odor, lesions, or dysuria. She denies tobacco use, drug use, or alcohol use. She is single with adult children and works as a Quarry manager at a [employer]. Patient denies any concerns or complaints today. She refuses to have a colonoscopy even after much education and encouragement.    Allergies  Allergen Reactions  . Penicillins Rash   Past Medical History  Diagnosis Date  . Hypertension   . Back pain    Current Outpatient Prescriptions on File Prior to Visit  Medication Sig Dispense Refill  . amLODipine (NORVASC) 10 MG tablet Take 1 tablet (10 mg total) by mouth daily. 30 tablet 5  . atorvastatin (LIPITOR) 10 MG tablet Take 1 tablet (10 mg total) by mouth daily. 30 tablet 5  . lisinopril-hydrochlorothiazide (ZESTORETIC) 20-12.5 MG per tablet Take 1 tablet by mouth daily. 30 tablet 5  . loratadine (CLARITIN) 10 MG tablet Take 1 tablet (10 mg total) by mouth daily. 30 tablet 3  . Multiple Vitamin (MULTIVITAMIN WITH MINERALS) TABS Take 1 tablet by mouth daily.    . cholecalciferol (VITAMIN D) 1000 UNITS tablet Take 1,000 Units by mouth daily.    . fluticasone (FLONASE) 50 MCG/ACT nasal spray Place 2 sprays into both nostrils daily. 16 g 6  . [DISCONTINUED] lisinopril (PRINIVIL,ZESTRIL) 40 MG tablet Take 1 tablet (40 mg total) by mouth daily. 30 tablet 2   No current facility-administered medications on file prior to visit.   History reviewed. No pertinent family history. Social History   Social History  . Marital Status: Single    Spouse Name: N/A  . Number of Children: N/A  . Years of Education: N/A   Occupational History  . Not on file.    Social History Main Topics  . Smoking status: Never Smoker   . Smokeless tobacco: Not on file  . Alcohol Use: No  . Drug Use: No  . Sexual Activity: No   Other Topics Concern  . Not on file   Social History Narrative    Review of Systems: Constitutional: Negative for fever, chills, diaphoresis, activity change, appetite change and fatigue. HENT: Negative for ear pain, nosebleeds, congestion, facial swelling, rhinorrhea, neck pain, neck stiffness and ear discharge.  Eyes: Negative for pain, discharge, redness, itching and visual disturbance. Respiratory: Negative for cough, choking, chest tightness, shortness of breath, wheezing and stridor.  Cardiovascular: Negative for chest pain, palpitations and leg swelling. Gastrointestinal: Negative for abdominal distention. Genitourinary: Negative for dysuria, urgency, frequency, hematuria, flank pain, decreased urine volume, difficulty urinating and dyspareunia.  Musculoskeletal: Negative for back pain, joint swelling, arthralgias and gait problem. Neurological: Negative for dizziness, tremors, seizures, syncope, facial asymmetry, speech difficulty, weakness, light-headedness, numbness and headaches.  Hematological: Negative for adenopathy. Does not bruise/bleed easily. Psychiatric/Behavioral: Negative for hallucinations, behavioral problems, confusion, dysphoric mood, decreased concentration and agitation.    Objective:   Filed Vitals:   04/18/15 1022  BP: 146/85  Pulse: 69  Temp: 98 F (36.7 C)  Resp: 16    Physical Exam: Constitutional: Patient appears well-developed and well-nourished. No distress. HENT: Normocephalic, atraumatic, External right and left ear normal. Oropharynx is clear  and moist.  Eyes: Conjunctivae and EOM are normal. PERRLA, no scleral icterus. Neck: Normal ROM. Neck supple. No JVD. No tracheal deviation. No thyromegaly. CVS: RRR, S1/S2 +, no murmurs, no gallops, no carotid bruit.  Pulmonary: Effort and  breath sounds normal, no stridor, rhonchi, wheezes, rales.  Abdominal: Soft. BS +,  no distension, tenderness, rebound or guarding.  Musculoskeletal: Normal range of motion. No edema and no tenderness.  Lymphadenopathy: No lymphadenopathy noted, cervical, inguinal or axillary Neuro: Alert. Normal reflexes, muscle tone coordination. No cranial nerve deficit. Skin: Skin is warm and dry. No rash noted. Not diaphoretic. No erythema. No pallor. Psychiatric: Normal mood and affect. Behavior, judgment, thought content normal. Genitalia: Normal female without lesion, discharge or tenderness, NSSA, NT, no adnexal masses felt on exam  Breast: No tenderness, masses, or nipple abnormality     Lab Results  Component Value Date   WBC 5.9 10/06/2013   HGB 13.0 10/06/2013   HCT 38.9 10/06/2013   MCV 87.0 10/06/2013   PLT 211 10/06/2013   Lab Results  Component Value Date   CREATININE 0.52 04/04/2015   BUN 11 04/04/2015   NA 141 04/04/2015   K 3.7 04/04/2015   CL 100 04/04/2015   CO2 29 04/04/2015    Lab Results  Component Value Date   HGBA1C 5.7 10/06/2013   Lipid Panel     Component Value Date/Time   CHOL 199 08/23/2014 1007   TRIG 57 08/23/2014 1007   HDL 52 08/23/2014 1007   CHOLHDL 3.8 08/23/2014 1007   VLDL 11 08/23/2014 1007   LDLCALC 136* 08/23/2014 1007       Assessment and plan:   [name] was seen today for follow-up.  Diagnoses and all orders for this visit:  Annual physical exam -     Lipid panel -     CBC -     HIV antibody (with reflex) -     TSH -     Cytology - PAP Dewy Rose Refused colonoscopy.   Return if symptoms worsen or fail to improve.   Lance Bosch, [facility] [phone] 04/18/2015, 10:37 AM

## 2015-04-18 NOTE — Telephone Encounter (Signed)
Called patient. Patient verified name and date of birth. Patient notified that her mammogram is negative for malignancies and she will repeat in one year. Patient voiced understanding and stated that she has an appointment today.

## 2015-04-19 LAB — CERVICOVAGINAL ANCILLARY ONLY
CHLAMYDIA, DNA PROBE: NEGATIVE
NEISSERIA GONORRHEA: NEGATIVE
Wet Prep (BD Affirm): NEGATIVE

## 2015-04-19 LAB — CYTOLOGY - PAP

## 2015-04-19 LAB — HIV ANTIBODY (ROUTINE TESTING W REFLEX): HIV 1&2 Ab, 4th Generation: NONREACTIVE

## 2015-04-19 NOTE — Telephone Encounter (Signed)
Called patient and verified date of birth.  Patient given results of normal labs.  Patient verbalized understanding and has no further needs at this time.

## 2015-04-23 ENCOUNTER — Other Ambulatory Visit: Payer: Self-pay | Admitting: Internal Medicine

## 2015-04-23 ENCOUNTER — Telehealth: Payer: Self-pay

## 2015-04-23 DIAGNOSIS — E785 Hyperlipidemia, unspecified: Secondary | ICD-10-CM

## 2015-04-23 MED ORDER — ATORVASTATIN CALCIUM 20 MG PO TABS: 20.0000 mg | ORAL_TABLET | Freq: Every day | ORAL | Status: DC

## 2015-04-23 NOTE — Telephone Encounter (Signed)
Nurse called patient, patient verified date of birth. Patient aware of normal pap and agrees to repeat pap in 3 years. Patient aware of elevated cholesterol and agrees to take atorvastatin 20mg  and will pick up prescription.  Patient voices understanding and has no further questions at this time.

## 2015-04-23 NOTE — Telephone Encounter (Signed)
-----   Message from Lance Bosch, NP sent at 04/23/2015 11:37 AM EDT ----- Please call patient and let her know that pap was normal and we will repeat in 3 years. Her cholesterol is still elevated please have her increase atorvastatin to 20 mg daily. I will send a new Rx or the updated dose.

## 2015-06-10 ENCOUNTER — Encounter (HOSPITAL_COMMUNITY): Payer: Self-pay | Admitting: *Deleted

## 2015-06-10 ENCOUNTER — Emergency Department (HOSPITAL_COMMUNITY): Payer: Medicaid Other

## 2015-06-10 ENCOUNTER — Observation Stay (HOSPITAL_COMMUNITY): Payer: Medicaid Other

## 2015-06-10 ENCOUNTER — Inpatient Hospital Stay (HOSPITAL_COMMUNITY)
Admission: EM | Admit: 2015-06-10 | Discharge: 2015-06-12 | DRG: 439 | Disposition: A | Discharge status: Home / Self Care | Payer: Medicaid Other | Attending: Internal Medicine | Admitting: Internal Medicine

## 2015-06-10 DIAGNOSIS — F1020 Alcohol dependence, uncomplicated: Secondary | ICD-10-CM | POA: Diagnosis present

## 2015-06-10 DIAGNOSIS — J309 Allergic rhinitis, unspecified: Secondary | ICD-10-CM | POA: Diagnosis present

## 2015-06-10 DIAGNOSIS — K852 Alcohol induced acute pancreatitis without necrosis or infection: Principal | ICD-10-CM | POA: Insufficient documentation

## 2015-06-10 DIAGNOSIS — R0602 Shortness of breath: Secondary | ICD-10-CM | POA: Diagnosis present

## 2015-06-10 DIAGNOSIS — R55 Syncope and collapse: Secondary | ICD-10-CM | POA: Diagnosis present

## 2015-06-10 DIAGNOSIS — I10 Essential (primary) hypertension: Secondary | ICD-10-CM | POA: Diagnosis present

## 2015-06-10 DIAGNOSIS — R739 Hyperglycemia, unspecified: Secondary | ICD-10-CM | POA: Diagnosis present

## 2015-06-10 DIAGNOSIS — Z880 Allergy status to penicillin: Secondary | ICD-10-CM

## 2015-06-10 DIAGNOSIS — T502X5A Adverse effect of carbonic-anhydrase inhibitors, benzothiadiazides and other diuretics, initial encounter: Secondary | ICD-10-CM | POA: Diagnosis present

## 2015-06-10 DIAGNOSIS — E785 Hyperlipidemia, unspecified: Secondary | ICD-10-CM | POA: Diagnosis present

## 2015-06-10 DIAGNOSIS — R197 Diarrhea, unspecified: Secondary | ICD-10-CM | POA: Diagnosis present

## 2015-06-10 DIAGNOSIS — R1110 Vomiting, unspecified: Secondary | ICD-10-CM | POA: Insufficient documentation

## 2015-06-10 DIAGNOSIS — Z79899 Other long term (current) drug therapy: Secondary | ICD-10-CM

## 2015-06-10 DIAGNOSIS — N179 Acute kidney failure, unspecified: Secondary | ICD-10-CM | POA: Diagnosis present

## 2015-06-10 DIAGNOSIS — E876 Hypokalemia: Secondary | ICD-10-CM | POA: Diagnosis present

## 2015-06-10 DIAGNOSIS — Z79891 Long term (current) use of opiate analgesic: Secondary | ICD-10-CM

## 2015-06-10 DIAGNOSIS — E871 Hypo-osmolality and hyponatremia: Secondary | ICD-10-CM | POA: Diagnosis present

## 2015-06-10 DIAGNOSIS — E860 Dehydration: Secondary | ICD-10-CM | POA: Diagnosis present

## 2015-06-10 DIAGNOSIS — I959 Hypotension, unspecified: Secondary | ICD-10-CM | POA: Diagnosis present

## 2015-06-10 LAB — COMPREHENSIVE METABOLIC PANEL
ALK PHOS: 87 U/L (ref 38–126)
ALT: 80 U/L — AB (ref 14–54)
ANION GAP: 9 (ref 5–15)
AST: 101 U/L — ABNORMAL HIGH (ref 15–41)
Albumin: 3.1 g/dL — ABNORMAL LOW (ref 3.5–5.0)
BILIRUBIN TOTAL: 0.8 mg/dL (ref 0.3–1.2)
BUN: 14 mg/dL (ref 6–20)
CALCIUM: 8.5 mg/dL — AB (ref 8.9–10.3)
CO2: 23 mmol/L (ref 22–32)
CREATININE: 1.29 mg/dL — AB (ref 0.44–1.00)
Chloride: 95 mmol/L — ABNORMAL LOW (ref 101–111)
GFR, EST AFRICAN AMERICAN: 52 mL/min — AB (ref >60)
GFR, EST NON AFRICAN AMERICAN: 45 mL/min — AB (ref >60)
Glucose, Bld: 181 mg/dL — ABNORMAL HIGH (ref 65–99)
Potassium: 2.5 mmol/L — CL (ref 3.5–5.1)
Sodium: 127 mmol/L — ABNORMAL LOW (ref 135–145)
TOTAL PROTEIN: 6.4 g/dL — AB (ref 6.5–8.1)

## 2015-06-10 LAB — BASIC METABOLIC PANEL
ANION GAP: 13 (ref 5–15)
BUN: 14 mg/dL (ref 6–20)
CHLORIDE: 85 mmol/L — AB (ref 101–111)
CO2: 25 mmol/L (ref 22–32)
Calcium: 9.4 mg/dL (ref 8.9–10.3)
Creatinine, Ser: 1.58 mg/dL — ABNORMAL HIGH (ref 0.44–1.00)
GFR calc Af Amer: 41 mL/min — ABNORMAL LOW (ref >60)
GFR calc non Af Amer: 35 mL/min — ABNORMAL LOW (ref >60)
GLUCOSE: 284 mg/dL — AB (ref 65–99)
POTASSIUM: 2.3 mmol/L — AB (ref 3.5–5.1)
SODIUM: 123 mmol/L — AB (ref 135–145)

## 2015-06-10 LAB — URINALYSIS, ROUTINE W REFLEX MICROSCOPIC
Glucose, UA: NEGATIVE mg/dL
Hgb urine dipstick: NEGATIVE
Ketones, ur: 15 mg/dL — AB
NITRITE: NEGATIVE
Protein, ur: 100 mg/dL — AB
SPECIFIC GRAVITY, URINE: 1.021 (ref 1.005–1.030)
UROBILINOGEN UA: 1 mg/dL (ref 0.0–1.0)
pH: 5.5 (ref 5.0–8.0)

## 2015-06-10 LAB — CBC
HEMATOCRIT: 37.1 % (ref 36.0–46.0)
HEMOGLOBIN: 13.2 g/dL (ref 12.0–15.0)
MCH: 30.9 pg (ref 26.0–34.0)
MCHC: 35.6 g/dL (ref 30.0–36.0)
MCV: 86.9 fL (ref 78.0–100.0)
Platelets: 133 10*3/uL — ABNORMAL LOW (ref 150–400)
RBC: 4.27 MIL/uL (ref 3.87–5.11)
RDW: 18.6 % — AB (ref 11.5–15.5)
WBC: 4.1 10*3/uL (ref 4.0–10.5)

## 2015-06-10 LAB — URINE MICROSCOPIC-ADD ON

## 2015-06-10 LAB — MAGNESIUM: MAGNESIUM: 1.6 mg/dL — AB (ref 1.7–2.4)

## 2015-06-10 LAB — LIPASE, BLOOD: Lipase: 252 U/L — ABNORMAL HIGH (ref 22–51)

## 2015-06-10 LAB — CBG MONITORING, ED: GLUCOSE-CAPILLARY: 252 mg/dL — AB (ref 65–99)

## 2015-06-10 MED ORDER — ONDANSETRON HCL 4 MG PO TABS: 4.0000 mg | ORAL_TABLET | Freq: Four times a day (QID) | ORAL | Status: DC | PRN

## 2015-06-10 MED ORDER — LORAZEPAM 1 MG PO TABS
1.0000 mg | ORAL_TABLET | Freq: Four times a day (QID) | ORAL | Status: DC | PRN
  Filled 2015-06-10: qty 1

## 2015-06-10 MED ORDER — ADULT MULTIVITAMIN W/MINERALS CH
1.0000 | ORAL_TABLET | Freq: Every day | ORAL | Status: DC
  Administered 2015-06-10 – 2015-06-12 (×3): 1 via ORAL
  Filled 2015-06-10 (×3): qty 1

## 2015-06-10 MED ORDER — SODIUM CHLORIDE 0.9 % IV BOLUS (SEPSIS)
2000.0000 mL | Freq: Once | INTRAVENOUS | Status: AC
  Administered 2015-06-10: 2000 mL via INTRAVENOUS

## 2015-06-10 MED ORDER — ATORVASTATIN CALCIUM 10 MG PO TABS
20.0000 mg | ORAL_TABLET | Freq: Every day | ORAL | Status: DC
  Administered 2015-06-10 – 2015-06-11 (×2): 20 mg via ORAL
  Filled 2015-06-10 (×2): qty 2

## 2015-06-10 MED ORDER — THIAMINE HCL 100 MG/ML IJ SOLN
100.0000 mg | Freq: Every day | INTRAMUSCULAR | Status: DC
  Filled 2015-06-10: qty 2

## 2015-06-10 MED ORDER — SODIUM CHLORIDE 0.9 % IJ SOLN
3.0000 mL | Freq: Two times a day (BID) | INTRAMUSCULAR | Status: DC
  Administered 2015-06-11: 3 mL via INTRAVENOUS

## 2015-06-10 MED ORDER — POTASSIUM CHLORIDE 10 MEQ/100ML IV SOLN
10.0000 meq | Freq: Once | INTRAVENOUS | Status: AC
  Administered 2015-06-10: 10 meq via INTRAVENOUS
  Filled 2015-06-10: qty 100

## 2015-06-10 MED ORDER — LORAZEPAM 2 MG/ML IJ SOLN: 0.0000 mg | Freq: Two times a day (BID) | INTRAMUSCULAR | Status: DC

## 2015-06-10 MED ORDER — POTASSIUM CHLORIDE CRYS ER 20 MEQ PO TBCR
20.0000 meq | EXTENDED_RELEASE_TABLET | Freq: Two times a day (BID) | ORAL | Status: DC
  Administered 2015-06-10: 20 meq via ORAL
  Filled 2015-06-10: qty 1

## 2015-06-10 MED ORDER — SODIUM CHLORIDE 0.9 % IV BOLUS (SEPSIS)
1000.0000 mL | Freq: Once | INTRAVENOUS | Status: AC
  Administered 2015-06-10: 1000 mL via INTRAVENOUS

## 2015-06-10 MED ORDER — ONDANSETRON HCL 4 MG/2ML IJ SOLN: 4.0000 mg | Freq: Four times a day (QID) | INTRAMUSCULAR | Status: DC | PRN

## 2015-06-10 MED ORDER — MORPHINE SULFATE (PF) 2 MG/ML IV SOLN: 1.0000 mg | INTRAVENOUS | Status: DC | PRN

## 2015-06-10 MED ORDER — LORAZEPAM 2 MG/ML IJ SOLN: 0.0000 mg | Freq: Four times a day (QID) | INTRAMUSCULAR | Status: DC

## 2015-06-10 MED ORDER — POTASSIUM CHLORIDE CRYS ER 20 MEQ PO TBCR
40.0000 meq | EXTENDED_RELEASE_TABLET | Freq: Three times a day (TID) | ORAL | Status: DC
  Administered 2015-06-10 (×2): 40 meq via ORAL
  Filled 2015-06-10 (×3): qty 2

## 2015-06-10 MED ORDER — BOOST / RESOURCE BREEZE PO LIQD
1.0000 | Freq: Three times a day (TID) | ORAL | Status: DC
  Administered 2015-06-11 (×2): 1 via ORAL

## 2015-06-10 MED ORDER — LORAZEPAM 2 MG/ML IJ SOLN
1.0000 mg | Freq: Four times a day (QID) | INTRAMUSCULAR | Status: DC | PRN
  Administered 2015-06-11: 1 mg via INTRAVENOUS
  Filled 2015-06-10: qty 1

## 2015-06-10 MED ORDER — VITAMIN B-1 100 MG PO TABS
100.0000 mg | ORAL_TABLET | Freq: Every day | ORAL | Status: DC
Start: 2015-06-10 — End: 2015-06-12
  Administered 2015-06-10 – 2015-06-12 (×3): 100 mg via ORAL
  Filled 2015-06-10 (×3): qty 1

## 2015-06-10 MED ORDER — ONDANSETRON HCL 4 MG/2ML IJ SOLN
4.0000 mg | Freq: Once | INTRAMUSCULAR | Status: AC
  Administered 2015-06-10: 4 mg via INTRAVENOUS
  Filled 2015-06-10: qty 2

## 2015-06-10 MED ORDER — FOLIC ACID 1 MG PO TABS
1.0000 mg | ORAL_TABLET | Freq: Every day | ORAL | Status: DC
  Administered 2015-06-10 – 2015-06-12 (×3): 1 mg via ORAL
  Filled 2015-06-10 (×3): qty 1

## 2015-06-10 MED ORDER — MAGNESIUM SULFATE 2 GM/50ML IV SOLN
2.0000 g | Freq: Once | INTRAVENOUS | Status: AC
  Administered 2015-06-10: 2 g via INTRAVENOUS
  Filled 2015-06-10: qty 50

## 2015-06-10 MED ORDER — POTASSIUM CHLORIDE IN NACL 20-0.9 MEQ/L-% IV SOLN
INTRAVENOUS | Status: DC
  Administered 2015-06-10 – 2015-06-12 (×4): via INTRAVENOUS
  Filled 2015-06-10 (×7): qty 1000

## 2015-06-10 NOTE — H&P (Signed)
[facility] History and Physical  Emily Dickson AST:[id number] DOB: October 07, 1955 DOA: 06/10/2015  Referring physician:  PCP: Lance Bosch, NP  Specialists:   Chief Complaint: dizziness, vomiting diarrhea   HPI: Emily Dickson is a 59 y.o. female with PMH of HTN, Alcoholism, presented nausea, vomiting and diarrhea for 3-4 days. She is found to have hypotension, hypokalemia, and hyponatremia in ED. Patient also reports mild epigastric discomfort but no acute pains. She denies fever, no cough but has mild SOB. Denies chest pains. No sick contact. Patient reports drinking alcohol due to recent loss of her job  -ED: given IV NS, replaced potassium IV. Patient vomited again. hospitalist is called for admission observation for dehydration   Review of Systems: The patient denies anorexia, fever, weight loss,, vision loss, decreased hearing, hoarseness, chest pain, syncope, dyspnea on exertion, peripheral edema, balance deficits, hemoptysis, abdominal pain, melena, hematochezia, severe indigestion/heartburn, hematuria, incontinence, genital sores, muscle weakness, suspicious skin lesions, transient blindness, difficulty walking, depression, unusual weight change, abnormal bleeding, enlarged lymph nodes, angioedema, and breast masses.    Past Medical History  Diagnosis Date  . Hypertension   . Back pain    History reviewed. No pertinent past surgical history. Social History:  reports that she has never smoked. She does not have any smokeless tobacco history on file. She reports that she does not drink alcohol or use illicit drugs. Home;  where does patient live--home, ALF, SNF? and with whom if at home? Yes;  Can patient participate in ADLs?  Allergies  Allergen Reactions  . Penicillins Rash    Has patient had a PCN reaction causing immediate rash, facial/tongue/throat swelling, SOB or lightheadedness with hypotension: No Has patient had a PCN reaction causing severe rash involving  mucus membranes or skin necrosis: No Has patient had a PCN reaction that required hospitalization No Has patient had a PCN reaction occurring within the last 10 years: No If all of the above answers are "NO", then may proceed with Cephalosporin use.    No family history on file. denies h/o DM, CAD (be sure to complete)  Prior to Admission medications   Medication Sig Start Date End Date Taking? Authorizing Provider  amLODipine (NORVASC) 10 MG tablet Take 1 tablet (10 mg total) by mouth daily. 04/04/15  Yes Lance Bosch, NP  atorvastatin (LIPITOR) 20 MG tablet Take 1 tablet (20 mg total) by mouth daily at 6 PM. 04/23/15  Yes Lance Bosch, NP  lisinopril-hydrochlorothiazide (ZESTORETIC) 20-12.5 MG per tablet Take 1 tablet by mouth daily. 04/04/15  Yes Lance Bosch, NP  loratadine (CLARITIN) 10 MG tablet Take 1 tablet (10 mg total) by mouth daily. 08/01/14  Yes Lance Bosch, NP  fluticasone (FLONASE) 50 MCG/ACT nasal spray Place 2 sprays into both nostrils daily. 04/04/15   Lance Bosch, NP   Physical Exam: Filed Vitals:   06/10/15 1613  BP: 118/59  Pulse: 87  Temp:   Resp: 16     General:  Alert. No distress   Eyes: eom-i, perrla   ENT: no oral ulcers   Neck: supple   Cardiovascular: s1,s2 rrr  Respiratory: CTA BL   Abdomen: soft, nt,nd   Skin: no rash   Musculoskeletal: no leg edema   Psychiatric: no hallucinations   Neurologic: CN 2-12 intact. Motor 5/5 bl  Labs on Admission:  Basic Metabolic Panel:  Recent Labs Lab 06/10/15 1350  NA 123*  K 2.3*  CL 85*  CO2 25  GLUCOSE 284*  BUN 14  CREATININE 1.58*  CALCIUM 9.4   Liver Function Tests: No results for input(s): AST, ALT, ALKPHOS, BILITOT, PROT, ALBUMIN in the last 168 hours. No results for input(s): LIPASE, AMYLASE in the last 168 hours. No results for input(s): AMMONIA in the last 168 hours. CBC:  Recent Labs Lab 06/10/15 1350  WBC 4.1  HGB 13.2  HCT 37.1  MCV 86.9  PLT 133*    Cardiac Enzymes: No results for input(s): CKTOTAL, CKMB, CKMBINDEX, TROPONINI in the last 168 hours.  BNP (last 3 results) No results for input(s): BNP in the last 8760 hours.  ProBNP (last 3 results) No results for input(s): PROBNP in the last 8760 hours.  CBG:  Recent Labs Lab 06/10/15 1501  GLUCAP 252*    Radiological Exams on Admission: Dg Chest 2 View  06/10/2015   CLINICAL DATA:  Dizziness with nausea and vomiting for 1 week.  EXAM: CHEST  2 VIEW  COMPARISON:  Chest x-ray dated 06/22/2013.  FINDINGS: The heart size and mediastinal contours are stable. Both lungs are clear. Lung volumes are normal. The visualized skeletal structures are unremarkable. Mild degenerative change noted within the thoracic spine.  IMPRESSION: Stable chest x-ray. No evidence of acute cardiopulmonary abnormality.   Electronically Signed   By: Franki Cabot M.D.   On: 06/10/2015 15:03    EKG: Independently reviewed.   Assessment/Plan Active Problems:   Hypokalemia   Dehydration   AKI (acute kidney injury) ([place])  59 y.o. female with PMH of HTN, Alcoholism, presented nausea, vomiting and diarrhea for 3-4 days.  -Admitted with hypotension, hypokalemia, hyponatremia, AKI  1. Nausea, vomiting of unclear etiology. We will obtain lipase to r/o pancreatitis with chronic alcohol use. Obtain LFTs, bili. Obtain liver/gallbladder US. Cont antiemetics, IVF, replace lytes. NPO for now   2. AKI likely prerenal. Hypotension. Dehydration due to vomiting, diarrhea. Hypokalemia, hyponatremia. Cont IVF, replace lytes. Check Mg. Hold diuretics/ACE. Monitor  3. Alcoholism. Patient denies h/o withdrawals. Monitor on CIWA. Counseled to cut down on etoh   4. H/o HTN. Patient is hypotensive on admission. We will hold BP meds. Cont IVF.  5. Hyperglycemia, dehydration, nausea vomiting. Obtain ha1c to r/o DM   None;  if consultant consulted, please document name and whether formally or informally consulted  Code  Status: full (must indicate code status--if unknown or must be presumed, indicate so) Family Communication: d/w patient, her daughter  (indicate person spoken with, if applicable, with phone number if by telephone) Disposition Plan: home pend clinical improvement  (indicate anticipated LOS)  Time spent: >45 minutes   Kinnie Feil [facility] Pager [phone]  If 7PM-7AM, please contact night-coverage [email or web address] Password [email or web address] 06/10/2015, 4:45 PM

## 2015-06-10 NOTE — ED Notes (Signed)
MD at bedside. 

## 2015-06-10 NOTE — ED Provider Notes (Signed)
CSN: [id number]     Arrival date & time 06/10/15  1252 History   First MD Initiated Contact with Patient 06/10/15 1409     Chief Complaint  Patient presents with  . Dizziness  . Hypotension     (Consider location/radiation/quality/duration/timing/severity/associated sxs/prior Treatment) HPI Comments: 59 y.o. Female with history of HTN presents for lightheadedness.  The patient reports that she has had diarrhea over the last day and that today while at work she became lightheaded like she might pass out and did not feel well.  She denies chest pain, shortness of breath.  No abdominal pain other than cramping.  She has had some nausea and vomiting although mainly diarrhea.  No fevers or chills.    Patient is a 59 y.o. female presenting with dizziness.  Dizziness Associated symptoms: weakness   Associated symptoms: no chest pain, no diarrhea, no headaches, no nausea, no palpitations, no shortness of breath and no vomiting     Past Medical History  Diagnosis Date  . Hypertension   . Back pain    History reviewed. No pertinent past surgical history. No family history on file. Social History  Substance Use Topics  . Smoking status: Never Smoker   . Smokeless tobacco: None  . Alcohol Use: No   OB History    No data available     Review of Systems  Constitutional: Negative for fever, chills, diaphoresis and fatigue.  HENT: Negative for congestion, postnasal drip and rhinorrhea.   Eyes: Negative for photophobia and visual disturbance.  Respiratory: Negative for cough, chest tightness and shortness of breath.   Cardiovascular: Negative for chest pain and palpitations.  Gastrointestinal: Negative for nausea, vomiting, diarrhea, constipation and abdominal distention.  Genitourinary: Negative for dysuria, urgency and hematuria.  Musculoskeletal: Negative for myalgias and back pain.  Skin: Negative for rash.  Neurological: Positive for weakness and light-headedness. Negative for  dizziness and headaches.  Hematological: Does not bruise/bleed easily.      Allergies  Penicillins  Home Medications   Prior to Admission medications   Medication Sig Start Date End Date Taking? Authorizing Provider  amLODipine (NORVASC) 10 MG tablet Take 1 tablet (10 mg total) by mouth daily. 04/04/15  Yes Lance Bosch, NP  atorvastatin (LIPITOR) 20 MG tablet Take 1 tablet (20 mg total) by mouth daily at 6 PM. 04/23/15  Yes Lance Bosch, NP  lisinopril-hydrochlorothiazide (ZESTORETIC) 20-12.5 MG per tablet Take 1 tablet by mouth daily. 04/04/15  Yes Lance Bosch, NP  loratadine (CLARITIN) 10 MG tablet Take 1 tablet (10 mg total) by mouth daily. 08/01/14  Yes Lance Bosch, NP  fluticasone (FLONASE) 50 MCG/ACT nasal spray Place 2 sprays into both nostrils daily. 04/04/15   Lance Bosch, NP   BP 124/58 mmHg  Pulse 87  Temp(Src) 97.7 F (36.5 C) (Oral)  Resp 16  Ht 5\' 7"  (1.702 m)  Wt 246 lb 8 oz (111.812 kg)  BMI 38.60 kg/m2  SpO2 100% Physical Exam  Constitutional: She is oriented to person, place, and time. She appears well-developed and well-nourished. No distress.  HENT:  Head: Normocephalic and atraumatic.  Right Ear: External ear normal.  Left Ear: External ear normal.  Nose: Nose normal.  Mouth/Throat: Oropharynx is clear and moist. No oropharyngeal exudate.  Eyes: EOM are normal. Pupils are equal, round, and reactive to light.  Neck: Normal range of motion. Neck supple.  Cardiovascular: Normal rate, regular rhythm, normal heart sounds and intact distal pulses.   No murmur  heard. Pulmonary/Chest: Effort normal. No respiratory distress. She has no wheezes. She has no rales.  Abdominal: Soft. She exhibits no distension. There is no tenderness.  Musculoskeletal: Normal range of motion. She exhibits no edema or tenderness.  Neurological: She is alert and oriented to person, place, and time.  Skin: Skin is warm and dry. No rash noted. She is not diaphoretic.  Vitals  reviewed.   ED Course  Procedures (including critical care time) Labs Review Labs Reviewed  BASIC METABOLIC PANEL - Abnormal; Notable for the following:    Sodium 123 (*)    Potassium 2.3 (*)    Chloride 85 (*)    Glucose, Bld 284 (*)    Creatinine, Ser 1.58 (*)    GFR calc non Af Amer 35 (*)    GFR calc Af Amer 41 (*)    All other components within normal limits  CBC - Abnormal; Notable for the following:    RDW 18.6 (*)    Platelets 133 (*)    All other components within normal limits  URINALYSIS, ROUTINE W REFLEX MICROSCOPIC (NOT AT [facility]) - Abnormal; Notable for the following:    Color, Urine AMBER (*)    APPearance TURBID (*)    Bilirubin Urine MODERATE (*)    Ketones, ur 15 (*)    Protein, ur 100 (*)    Leukocytes, UA TRACE (*)    All other components within normal limits  URINE MICROSCOPIC-ADD ON - Abnormal; Notable for the following:    Squamous Epithelial / LPF MANY (*)    Bacteria, UA MANY (*)    Casts HYALINE CASTS (*)    All other components within normal limits  LIPASE, BLOOD - Abnormal; Notable for the following:    Lipase 252 (*)    All other components within normal limits  COMPREHENSIVE METABOLIC PANEL - Abnormal; Notable for the following:    Sodium 127 (*)    Potassium 2.5 (*)    Chloride 95 (*)    Glucose, Bld 181 (*)    Creatinine, Ser 1.29 (*)    Calcium 8.5 (*)    Total Protein 6.4 (*)    Albumin 3.1 (*)    AST 101 (*)    ALT 80 (*)    GFR calc non Af Amer 45 (*)    GFR calc Af Amer 52 (*)    All other components within normal limits  MAGNESIUM - Abnormal; Notable for the following:    Magnesium 1.6 (*)    All other components within normal limits  CBG MONITORING, ED - Abnormal; Notable for the following:    Glucose-Capillary 252 (*)    All other components within normal limits  HEMOGLOBIN A1C  COMPREHENSIVE METABOLIC PANEL  CBC    Imaging Review Dg Chest 2 View  06/10/2015   CLINICAL DATA:  Dizziness with nausea and vomiting for 1  week.  EXAM: CHEST  2 VIEW  COMPARISON:  Chest x-ray dated 06/22/2013.  FINDINGS: The heart size and mediastinal contours are stable. Both lungs are clear. Lung volumes are normal. The visualized skeletal structures are unremarkable. Mild degenerative change noted within the thoracic spine.  IMPRESSION: Stable chest x-ray. No evidence of acute cardiopulmonary abnormality.   Electronically Signed   By: Franki Cabot M.D.   On: 06/10/2015 15:03   I have personally reviewed and evaluated these images and lab results as part of my medical decision-making.   EKG Interpretation   Date/Time:  Monday June 10 2015 13:44:31 EDT  Ventricular Rate:  83 PR Interval:  185 QRS Duration: 115 QT Interval:  378 QTC Calculation: 444 R Axis:   39 Text Interpretation:  Sinus rhythm LAE, consider biatrial enlargement Rate  has increased compared to previous tracing Confirmed by NGUYEN, EMILY  ([id number]) on 06/10/2015 3:53:24 PM      MDM  Patient seen and evaluated in stable condition.  Patient with severe hypokalemia, hyponatremia, elevated cr.  Patient given Potassium IV and oral was attempted but patient with episode of vomiting.  Patient also hydrated with IV fluids.  Discussed with hospitalist who agreed with admission.  Patient admitted under their care with [place] for observation. Final diagnoses:  Hypokalemia  Near syncope  Hypotension, unspecified hypotension type  Hyperglycemia    1. Hypokalemia  2. Hyponatremia 3. Dehydration    Harvel Quale, MD 06/10/15 2250

## 2015-06-10 NOTE — ED Notes (Signed)
RN starting IV 

## 2015-06-10 NOTE — Progress Notes (Signed)
CRITICAL VALUE ALERT  Critical value received:  potassium  Date of notification:  06/10/15  Time of notification:  9791  Critical value read back:yes  Nurse who received alert:  Wess Botts  MD notified (1st page):  Daleen Bo  Time of first page:  1800  MD notified (2nd page):  Time of second page:  Responding MD:   Time MD responded:

## 2015-06-10 NOTE — ED Notes (Signed)
Pt reports dizziness with n/v x 1 week.  Reports same happened in the past and was found to be hypokalemic.  Pt also c/o sore on R side of her tongue.

## 2015-06-10 NOTE — ED Notes (Signed)
MD notified of abnormal lab

## 2015-06-11 DIAGNOSIS — Z79899 Other long term (current) drug therapy: Secondary | ICD-10-CM | POA: Diagnosis not present

## 2015-06-11 DIAGNOSIS — E871 Hypo-osmolality and hyponatremia: Secondary | ICD-10-CM | POA: Diagnosis present

## 2015-06-11 DIAGNOSIS — E860 Dehydration: Secondary | ICD-10-CM | POA: Diagnosis present

## 2015-06-11 DIAGNOSIS — F1020 Alcohol dependence, uncomplicated: Secondary | ICD-10-CM | POA: Diagnosis present

## 2015-06-11 DIAGNOSIS — Z880 Allergy status to penicillin: Secondary | ICD-10-CM | POA: Diagnosis not present

## 2015-06-11 DIAGNOSIS — R0602 Shortness of breath: Secondary | ICD-10-CM | POA: Diagnosis present

## 2015-06-11 DIAGNOSIS — N179 Acute kidney failure, unspecified: Secondary | ICD-10-CM | POA: Diagnosis present

## 2015-06-11 DIAGNOSIS — R197 Diarrhea, unspecified: Secondary | ICD-10-CM | POA: Diagnosis present

## 2015-06-11 DIAGNOSIS — R739 Hyperglycemia, unspecified: Secondary | ICD-10-CM | POA: Diagnosis present

## 2015-06-11 DIAGNOSIS — I959 Hypotension, unspecified: Secondary | ICD-10-CM | POA: Diagnosis present

## 2015-06-11 DIAGNOSIS — K852 Alcohol induced acute pancreatitis without necrosis or infection: Secondary | ICD-10-CM | POA: Diagnosis not present

## 2015-06-11 DIAGNOSIS — J309 Allergic rhinitis, unspecified: Secondary | ICD-10-CM | POA: Diagnosis present

## 2015-06-11 DIAGNOSIS — R112 Nausea with vomiting, unspecified: Secondary | ICD-10-CM | POA: Diagnosis not present

## 2015-06-11 DIAGNOSIS — I10 Essential (primary) hypertension: Secondary | ICD-10-CM | POA: Diagnosis present

## 2015-06-11 DIAGNOSIS — E876 Hypokalemia: Secondary | ICD-10-CM | POA: Diagnosis present

## 2015-06-11 DIAGNOSIS — Z79891 Long term (current) use of opiate analgesic: Secondary | ICD-10-CM | POA: Diagnosis not present

## 2015-06-11 DIAGNOSIS — T502X5A Adverse effect of carbonic-anhydrase inhibitors, benzothiadiazides and other diuretics, initial encounter: Secondary | ICD-10-CM | POA: Diagnosis present

## 2015-06-11 DIAGNOSIS — R55 Syncope and collapse: Secondary | ICD-10-CM | POA: Diagnosis present

## 2015-06-11 DIAGNOSIS — E785 Hyperlipidemia, unspecified: Secondary | ICD-10-CM | POA: Diagnosis present

## 2015-06-11 LAB — CBC
HCT: 34.6 % — ABNORMAL LOW (ref 36.0–46.0)
Hemoglobin: 12.3 g/dL (ref 12.0–15.0)
MCH: 30.8 pg (ref 26.0–34.0)
MCHC: 35.5 g/dL (ref 30.0–36.0)
MCV: 86.7 fL (ref 78.0–100.0)
PLATELETS: 155 10*3/uL (ref 150–400)
RBC: 3.99 MIL/uL (ref 3.87–5.11)
RDW: 19.3 % — AB (ref 11.5–15.5)
WBC: 4.7 10*3/uL (ref 4.0–10.5)

## 2015-06-11 LAB — COMPREHENSIVE METABOLIC PANEL
ALBUMIN: 3.3 g/dL — AB (ref 3.5–5.0)
ALK PHOS: 92 U/L (ref 38–126)
ALT: 82 U/L — AB (ref 14–54)
AST: 106 U/L — AB (ref 15–41)
Anion gap: 7 (ref 5–15)
BUN: 12 mg/dL (ref 6–20)
CALCIUM: 8.7 mg/dL — AB (ref 8.9–10.3)
CO2: 22 mmol/L (ref 22–32)
CREATININE: 0.86 mg/dL (ref 0.44–1.00)
Chloride: 104 mmol/L (ref 101–111)
GFR calc non Af Amer: >60 mL/min (ref >60)
GLUCOSE: 123 mg/dL — AB (ref 65–99)
Potassium: 3.8 mmol/L (ref 3.5–5.1)
SODIUM: 133 mmol/L — AB (ref 135–145)
Total Bilirubin: 0.9 mg/dL (ref 0.3–1.2)
Total Protein: 6.9 g/dL (ref 6.5–8.1)

## 2015-06-11 LAB — HEMOGLOBIN A1C
HEMOGLOBIN A1C: 5.9 % — AB (ref 4.8–5.6)
MEAN PLASMA GLUCOSE: 123 mg/dL

## 2015-06-11 LAB — LIPASE, BLOOD: LIPASE: 165 U/L — AB (ref 22–51)

## 2015-06-11 MED ORDER — POTASSIUM CHLORIDE CRYS ER 20 MEQ PO TBCR
40.0000 meq | EXTENDED_RELEASE_TABLET | Freq: Every day | ORAL | Status: DC
  Administered 2015-06-12: 40 meq via ORAL
  Filled 2015-06-11: qty 2

## 2015-06-11 MED ORDER — ACETAMINOPHEN 325 MG PO TABS
650.0000 mg | ORAL_TABLET | Freq: Four times a day (QID) | ORAL | Status: DC | PRN
  Administered 2015-06-11: 650 mg via ORAL
  Filled 2015-06-11: qty 2

## 2015-06-11 NOTE — Progress Notes (Signed)
Report received from Joanie Coddington, RN. No change from initial am assessment. Will continue to monitor and follow the POC.

## 2015-06-11 NOTE — Progress Notes (Signed)
Initial Nutrition Assessment  DOCUMENTATION CODES:   Obesity unspecified  INTERVENTION:  - Diet advancement as medically feasible - Continue Boost Breeze TID, each supplement provides 250 kcal and 9 grams of protein - RD will continue to monitor for needs  NUTRITION DIAGNOSIS:   Inadequate oral intake related to acute illness, nausea, vomiting as evidenced by per patient/family report.  GOAL:   Patient will meet greater than or equal to 90% of their needs  MONITOR:   Diet advancement, Supplement acceptance, Weight trends, Labs, I & O's  REASON FOR ASSESSMENT:   Malnutrition Screening Tool  ASSESSMENT:   59 y.o. female with PMH of HTN, Alcoholism, presented nausea, vomiting and diarrhea for 3-4 days. She is found to have hypotension, hypokalemia, and hyponatremia in ED. Patient also reports mild epigastric discomfort but no acute pains. She denies fever, no cough but has mild SOB. Denies chest pains. No sick contact. Patient reports drinking alcohol due to recent loss of her job   Pt seen for MST. BMI indicates obesity. Pt on CLD since admission due to nausea and vomiting. Spoke with pt's daughters as she was sleeping. Daughters reports all information related to PTA.  Pt typically has a good appetite but for the past 1 month she has not been feeling well and has had decreased intakes. More recently she was mainly consuming pepper soup (a cultural dish, per daughters) or 1 hotdog/day. She was experiencing nausea and vomiting for unknown amount of time PTA but daughters state this has now improved; notes indicate pt was vomiting in ED last night and no documentation of emesis since admission.  Daughters are unsure of pt's UBW but state she has lost a few pounds recently related to current dx. Chart review indicates 4 lb weight loss (2% body weight) over the past 2 months which is not significant; this percentage is also not significant over the 1 month time frame of current  condition.   Not meeting needs with CLD although daughters state pt is tolerating this diet very well. Medications reviewed. Labs reviewed; Na: 133 mmol/L, Ca: 8.7 mg/dL, lipase: 165 units/L.   Diet Order:  Diet clear liquid Room service appropriate?: Yes; Fluid consistency:: Thin  Skin:  Reviewed, no issues  Last BM:  10/3  Height:   Ht Readings from Last 1 Encounters:  06/10/15 5\' 7"  (1.702 m)    Weight:   Wt Readings from Last 1 Encounters:  06/10/15 246 lb 8 oz (111.812 kg)    Ideal Body Weight:  61.36 kg (kg)  BMI:  Body mass index is 38.6 kg/(m^2).  Estimated Nutritional Needs:   Kcal:  1700-1900  Protein:  75-85 grams  Fluid:  2.2 L/day  EDUCATION NEEDS:   Education needs addressed     Jarome Matin, RD, LDN Inpatient Clinical Dietitian Pager # [phone] After hours/weekend pager # [phone]

## 2015-06-11 NOTE — Progress Notes (Signed)
[facility] PROGRESS NOTE  Emily Dickson ZOX:[id number] DOB: 06/11/1956 DOA: 06/10/2015 PCP: Lance Bosch, NP  Assessment/Plan: 59 y.o. female with PMH of HTN, Alcoholism, presented nausea, vomiting and diarrhea for 3-4 days.  -Admitted with pancreatitis, dehydration, hypotension, hypokalemia, hyponatremia, AKI  1. Acute pancreatits. Alcoholic pancreatitis. Nausea, vomiting on admission. gallbladder US: no gallstones.  -patient is clinically improving on antiemetics, IVF, replace lytes. NPO for now. We will recheck lipase. Diet if tolerated    2. AKI likely prerenal. Hypotension. Dehydration due to vomiting, diarrhea. Hypokalemia, hyponatremia.  -Pt is improving on IVF, replace lytes. Monitor  3. Alcoholism. Patient denies h/o withdrawals. Monitor on CIWA. Counseled to cut down on etoh  4. H/o HTN. Patient is hypotensive on admission. We will hold BP meds. Cont IVF.     Code Status: full Family Communication: d/w patient, her daughter  (indicate person spoken with, relationship, and if by phone, the number) Disposition Plan: home in 24-48 hrs    Consultants:  none  Procedures:  Liver US  Antibiotics:  none (indicate start date, and stop date if known)  HPI/Subjective: Alert. Reports some nausea   Objective: Filed Vitals:   06/11/15 0434  BP: 133/67  Pulse: 104  Temp: 97.8 F (36.6 C)  Resp: 17    Intake/Output Summary (Last 24 hours) at 06/11/15 1039 Last data filed at 06/11/15 0900  Gross per 24 hour  Intake 4766.67 ml  Output      1 ml  Net 4765.67 ml   Filed Weights   06/10/15 1807  Weight: 111.812 kg (246 lb 8 oz)    Exam:   General:  No distress   Cardiovascular: s1,s2 rrr  Respiratory: CTA BL  Abdomen: soft, nt, nd   Musculoskeletal: no leg edema    Data Reviewed: Basic Metabolic Panel:  Recent Labs Lab 06/10/15 1350 06/10/15 1722 06/11/15 0441  NA 123* 127* 133*  K 2.3* 2.5* 3.8  CL 85* 95* 104  CO2 25 23 22    GLUCOSE 284* 181* 123*  BUN 14 14 12   CREATININE 1.58* 1.29* 0.86  CALCIUM 9.4 8.5* 8.7*  MG  --  1.6*  --    Liver Function Tests:  Recent Labs Lab 06/10/15 1722 06/11/15 0441  AST 101* 106*  ALT 80* 82*  ALKPHOS 87 92  BILITOT 0.8 0.9  PROT 6.4* 6.9  ALBUMIN 3.1* 3.3*    Recent Labs Lab 06/10/15 1722  LIPASE 252*   No results for input(s): AMMONIA in the last 168 hours. CBC:  Recent Labs Lab 06/10/15 1350 06/11/15 0441  WBC 4.1 4.7  HGB 13.2 12.3  HCT 37.1 34.6*  MCV 86.9 86.7  PLT 133* 155   Cardiac Enzymes: No results for input(s): CKTOTAL, CKMB, CKMBINDEX, TROPONINI in the last 168 hours. BNP (last 3 results) No results for input(s): BNP in the last 8760 hours.  ProBNP (last 3 results) No results for input(s): PROBNP in the last 8760 hours.  CBG:  Recent Labs Lab 06/10/15 1501  GLUCAP 252*    No results found for this or any previous visit (from the past 240 hour(s)).   Studies: Dg Chest 2 View  06/10/2015   CLINICAL DATA:  Dizziness with nausea and vomiting for 1 week.  EXAM: CHEST  2 VIEW  COMPARISON:  Chest x-ray dated 06/22/2013.  FINDINGS: The heart size and mediastinal contours are stable. Both lungs are clear. Lung volumes are normal. The visualized skeletal structures are unremarkable. Mild degenerative change noted within the  thoracic spine.  IMPRESSION: Stable chest x-ray. No evidence of acute cardiopulmonary abnormality.   Electronically Signed   By: Franki Cabot M.D.   On: 06/10/2015 15:03   US Abdomen Complete  06/10/2015   CLINICAL DATA:  Vomiting  EXAM: ULTRASOUND ABDOMEN COMPLETE  COMPARISON:  None.  FINDINGS: Gallbladder: No gallstones or wall thickening visualized. No sonographic Murphy sign noted.  Common bile duct: Diameter: 4 mm.  Liver: The liver is heterogeneous in echotexture without focal mass.  IVC: No abnormality visualized.  Pancreas: Visualized portion unremarkable.  Spleen: Size and appearance within normal limits.   Right Kidney: Length: 12.2 cm. Echogenicity within normal limits. No mass or hydronephrosis visualized.  Left Kidney: Length: 13.1 cm. Echogenicity within normal limits. No mass or hydronephrosis visualized.  Abdominal aorta: No aneurysm visualized.  Other findings: None.  IMPRESSION: Heterogeneous liver may be related to diffuse hepatic parenchymal disease. No focal mass.  No acute intra-abdominal pathology.   Electronically Signed   By: Marybelle Killings M.D.   On: 06/10/2015 23:15    Scheduled Meds: . atorvastatin  20 mg Oral q1800  . feeding supplement  1 Container Oral TID BM  . folic acid  1 mg Oral Daily  . LORazepam  0-4 mg Intravenous Q6H   Followed by  . [START ON 06/12/2015] LORazepam  0-4 mg Intravenous Q12H  . multivitamin with minerals  1 tablet Oral Daily  . potassium chloride  40 mEq Oral TID  . sodium chloride  3 mL Intravenous Q12H  . thiamine  100 mg Oral Daily   Or  . thiamine  100 mg Intravenous Daily   Continuous Infusions: . 0.9 % NaCl with KCl 20 mEq / L 125 mL/hr at 06/11/15 0435    Active Problems:   Hypokalemia   Dehydration   AKI (acute kidney injury) ([place])    Time spent: >35 minutes     Kinnie Feil  [facility] Pager [phone]. If 7PM-7AM, please contact night-coverage at [email or web address], password [facility] 06/11/2015, 10:39 AM

## 2015-06-12 DIAGNOSIS — E876 Hypokalemia: Secondary | ICD-10-CM

## 2015-06-12 DIAGNOSIS — R1110 Vomiting, unspecified: Secondary | ICD-10-CM | POA: Insufficient documentation

## 2015-06-12 DIAGNOSIS — K852 Alcohol induced acute pancreatitis without necrosis or infection: Secondary | ICD-10-CM | POA: Insufficient documentation

## 2015-06-12 DIAGNOSIS — R112 Nausea with vomiting, unspecified: Secondary | ICD-10-CM

## 2015-06-12 DIAGNOSIS — N179 Acute kidney failure, unspecified: Secondary | ICD-10-CM

## 2015-06-12 DIAGNOSIS — E860 Dehydration: Secondary | ICD-10-CM

## 2015-06-12 LAB — BASIC METABOLIC PANEL
ANION GAP: 8 (ref 5–15)
BUN: 6 mg/dL (ref 6–20)
CALCIUM: 8.6 mg/dL — AB (ref 8.9–10.3)
CHLORIDE: 107 mmol/L (ref 101–111)
CO2: 23 mmol/L (ref 22–32)
CREATININE: 0.53 mg/dL (ref 0.44–1.00)
GFR calc non Af Amer: >60 mL/min (ref >60)
Glucose, Bld: 114 mg/dL — ABNORMAL HIGH (ref 65–99)
Potassium: 3.5 mmol/L (ref 3.5–5.1)
SODIUM: 138 mmol/L (ref 135–145)

## 2015-06-12 LAB — LIPASE, BLOOD: Lipase: 124 U/L — ABNORMAL HIGH (ref 22–51)

## 2015-06-12 MED ORDER — POTASSIUM CHLORIDE CRYS ER 20 MEQ PO TBCR: 20.0000 meq | EXTENDED_RELEASE_TABLET | Freq: Every day | ORAL | Status: DC

## 2015-06-12 MED ORDER — BOOST / RESOURCE BREEZE PO LIQD: 1.0000 | Freq: Two times a day (BID) | ORAL | Status: DC

## 2015-06-12 MED ORDER — ADULT MULTIVITAMIN W/MINERALS CH: 1.0000 | ORAL_TABLET | Freq: Every day | ORAL | Status: AC

## 2015-06-12 MED ORDER — OXYCODONE HCL 5 MG PO TABS: 5.0000 mg | ORAL_TABLET | Freq: Four times a day (QID) | ORAL | Status: DC | PRN

## 2015-06-12 MED ORDER — OXYCODONE HCL 5 MG PO TABS: 5.0000 mg | ORAL_TABLET | Freq: Three times a day (TID) | ORAL | Status: DC | PRN

## 2015-06-12 NOTE — Discharge Summary (Signed)
Physician Discharge Summary  Emily Dickson YBO:[id number] DOB: 10-May-1956 DOA: 06/10/2015  PCP: Lance Bosch, NP  Admit date: 06/10/2015 Discharge date: 06/12/2015  Time spent: >30 minutes  Recommendations for Outpatient Follow-up:  1. Repeat BMET to follow electrolytes and renal function  Discharge Diagnoses:  Acute alcoholic pancreatitis Alcohol abuse Hypokalemia Dehydration AKI (acute kidney injury) ([place]) Essential HTN Allergic rhinitis  HLD  Discharge Condition: stable and improved. Discharge home and patient will follow up with PCP in 10 days  Diet recommendation: heart healthy diet  Filed Weights   06/10/15 1807  Weight: 111.812 kg (246 lb 8 oz)    History of present illness:  59 y.o. female with PMH of HTN, Alcoholism, presented nausea, vomiting and diarrhea for 3-4 days. She is found to have hypotension, hypokalemia, and hyponatremia in ED. Patient also reports mild epigastric discomfort but and work up in ED demonstrated acute pancreatitis. She denies fever, no cough but has mild SOB. Denies chest pains. No sick contact. Patient reports drinking alcohol due to recent loss of her job   Hospital Course:  1-acute alcoholic pancreatitis: gallbladder w/o gallstones  -no further nausea, no vomiting and tolerating PO's -patient symptoms resolved with conservative management -patient advise to keep herself abstinent of alcohol and to follow low fat/heart healthy diet -will follow up with PCP in 10 days  2-AKI: pre-renal due to dehydration and continue use of lisinopril/HCTZ -after IVF's and holding nephrotoxic agents renal function is back to normal -will follow BMET at follow up appointment -patient will resume home antihypertensive regimen on 06/13/15  3-essential HTN: stable and overall well controlled -will continue home antihypertensive regimen -Patient has been recommended heart healthy diet  4-alcohol abuse: cessation counseling provided -patient in  agreement and looking to quit drinking -discharge on multivitamin to supplement thiamine and folic aicid  5-hypokalemia: due to diuretics and alcohol abuse -resolved -discharge on low dose maintenance supplementation  6-HLD: will continue lipitor   7-allergic rhinitis: will continue flonase and use of claritin    Procedures:  See below for x-ray results   Consultations:  None   Discharge Exam: Filed Vitals:   06/12/15 1351  BP: 136/81  Pulse: 83  Temp: 98.2 F (36.8 C)  Resp: 18    General: afebrile, no nausea, no vomiting and tolerating PO's without problems Cardiovascular: S1 and S2, no rubs or gallops Respiratory: CTA bilaterally Abd: soft, NT, ND, positive BS Extremities: no edema, no cyanosis   Discharge Instructions   Discharge Instructions    Diet - low sodium heart healthy    Complete by:  As directed      Discharge instructions    Complete by:  As directed   Arrange follow up with PCP in 10 days Please stop drinking alcohol Keep yourself well hydrated Follow a heart healthy diet          Current Discharge Medication List    START taking these medications   Details  feeding supplement (BOOST / RESOURCE BREEZE) LIQD Take 1 Container by mouth 2 (two) times daily between meals. Refills: 0    Multiple Vitamin (MULTIVITAMIN WITH MINERALS) TABS tablet Take 1 tablet by mouth daily.    oxyCODONE (OXY IR/ROXICODONE) 5 MG immediate release tablet Take 1 tablet (5 mg total) by mouth every 8 (eight) hours as needed for severe pain. Qty: 20 tablet, Refills: 0    potassium chloride SA (K-DUR,KLOR-CON) 20 MEQ tablet Take 1 tablet (20 mEq total) by mouth daily. Qty: 30 tablet, Refills: 0  CONTINUE these medications which have NOT CHANGED   Details  amLODipine (NORVASC) 10 MG tablet Take 1 tablet (10 mg total) by mouth daily. Qty: 30 tablet, Refills: 5   Associated Diagnoses: Essential hypertension    atorvastatin (LIPITOR) 20 MG tablet Take 1  tablet (20 mg total) by mouth daily at 6 PM. Qty: 30 tablet, Refills: 5   Associated Diagnoses: HLD (hyperlipidemia)    lisinopril-hydrochlorothiazide (ZESTORETIC) 20-12.5 MG per tablet Take 1 tablet by mouth daily. Qty: 30 tablet, Refills: 5   Associated Diagnoses: Essential hypertension    loratadine (CLARITIN) 10 MG tablet Take 1 tablet (10 mg total) by mouth daily. Qty: 30 tablet, Refills: 3   Associated Diagnoses: Allergic rhinitis, unspecified allergic rhinitis type    fluticasone (FLONASE) 50 MCG/ACT nasal spray Place 2 sprays into both nostrils daily. Qty: 16 g, Refills: 6   Associated Diagnoses: Allergic rhinitis, unspecified allergic rhinitis type       Allergies  Allergen Reactions  . Penicillins Rash    Has patient had a PCN reaction causing immediate rash, facial/tongue/throat swelling, SOB or lightheadedness with hypotension: No Has patient had a PCN reaction causing severe rash involving mucus membranes or skin necrosis: No Has patient had a PCN reaction that required hospitalization No Has patient had a PCN reaction occurring within the last 10 years: No If all of the above answers are "NO", then may proceed with Cephalosporin use.   Follow-up Information    Follow up with Lance Bosch, NP. Schedule an appointment as soon as possible for a visit in 10 days.   Specialty:  Internal Medicine   Contact information:   [place] [address] [phone]        The results of significant diagnostics from this hospitalization (including imaging, microbiology, ancillary and laboratory) are listed below for reference.    Significant Diagnostic Studies: Dg Chest 2 View  06/10/2015   CLINICAL DATA:  Dizziness with nausea and vomiting for 1 week.  EXAM: CHEST  2 VIEW  COMPARISON:  Chest x-ray dated 06/22/2013.  FINDINGS: The heart size and mediastinal contours are stable. Both lungs are clear. Lung volumes are normal. The visualized skeletal structures  are unremarkable. Mild degenerative change noted within the thoracic spine.  IMPRESSION: Stable chest x-ray. No evidence of acute cardiopulmonary abnormality.   Electronically Signed   By: Franki Cabot M.D.   On: 06/10/2015 15:03   US Abdomen Complete  06/10/2015   CLINICAL DATA:  Vomiting  EXAM: ULTRASOUND ABDOMEN COMPLETE  COMPARISON:  None.  FINDINGS: Gallbladder: No gallstones or wall thickening visualized. No sonographic Murphy sign noted.  Common bile duct: Diameter: 4 mm.  Liver: The liver is heterogeneous in echotexture without focal mass.  IVC: No abnormality visualized.  Pancreas: Visualized portion unremarkable.  Spleen: Size and appearance within normal limits.  Right Kidney: Length: 12.2 cm. Echogenicity within normal limits. No mass or hydronephrosis visualized.  Left Kidney: Length: 13.1 cm. Echogenicity within normal limits. No mass or hydronephrosis visualized.  Abdominal aorta: No aneurysm visualized.  Other findings: None.  IMPRESSION: Heterogeneous liver may be related to diffuse hepatic parenchymal disease. No focal mass.  No acute intra-abdominal pathology.   Electronically Signed   By: Marybelle Killings M.D.   On: 06/10/2015 23:15   Labs: Basic Metabolic Panel:  Recent Labs Lab 06/10/15 1350 06/10/15 1722 06/11/15 0441 06/12/15 0500  NA 123* 127* 133* 138  K 2.3* 2.5* 3.8 3.5  CL 85* 95* 104 107  CO2 25 23 22 23   GLUCOSE 284* 181* 123* 114*  BUN 14 14 12 6   CREATININE 1.58* 1.29* 0.86 0.53  CALCIUM 9.4 8.5* 8.7* 8.6*  MG  --  1.6*  --   --    Liver Function Tests:  Recent Labs Lab 06/10/15 1722 06/11/15 0441  AST 101* 106*  ALT 80* 82*  ALKPHOS 87 92  BILITOT 0.8 0.9  PROT 6.4* 6.9  ALBUMIN 3.1* 3.3*    Recent Labs Lab 06/10/15 1722 06/11/15 0441 06/12/15 0500  LIPASE 252* 165* 124*   CBC:  Recent Labs Lab 06/10/15 1350 06/11/15 0441  WBC 4.1 4.7  HGB 13.2 12.3  HCT 37.1 34.6*  MCV 86.9 86.7  PLT 133* 155   CBG:  Recent Labs Lab  06/10/15 1501  GLUCAP 252*    Signed:  Barton Dubois  [facility] 06/12/2015, 3:13 PM

## 2015-06-12 NOTE — Progress Notes (Signed)
Pt is active with [facility] and has a orange. No HH needs at present time.

## 2015-06-12 NOTE — Progress Notes (Signed)
Report received from F. [place], Therapist, sports. I agree with previous RN's assessment. Will continue to monitor pt and follow POC.  Carnella [place] I.

## 2015-06-18 ENCOUNTER — Encounter (HOSPITAL_COMMUNITY): Payer: Self-pay | Admitting: Emergency Medicine

## 2015-06-18 ENCOUNTER — Emergency Department (HOSPITAL_COMMUNITY)
Admission: EM | Admit: 2015-06-18 | Discharge: 2015-06-18 | Disposition: A | Discharge status: Home / Self Care | Payer: Self-pay | Attending: Emergency Medicine | Admitting: Emergency Medicine

## 2015-06-18 DIAGNOSIS — R1013 Epigastric pain: Secondary | ICD-10-CM

## 2015-06-18 DIAGNOSIS — I10 Essential (primary) hypertension: Secondary | ICD-10-CM | POA: Insufficient documentation

## 2015-06-18 DIAGNOSIS — Z79899 Other long term (current) drug therapy: Secondary | ICD-10-CM | POA: Insufficient documentation

## 2015-06-18 DIAGNOSIS — Z880 Allergy status to penicillin: Secondary | ICD-10-CM | POA: Insufficient documentation

## 2015-06-18 DIAGNOSIS — K859 Acute pancreatitis without necrosis or infection, unspecified: Secondary | ICD-10-CM | POA: Insufficient documentation

## 2015-06-18 LAB — COMPREHENSIVE METABOLIC PANEL
ALK PHOS: 81 U/L (ref 38–126)
ALT: 39 U/L (ref 14–54)
AST: 43 U/L — ABNORMAL HIGH (ref 15–41)
Albumin: 3.5 g/dL (ref 3.5–5.0)
Anion gap: 8 (ref 5–15)
BILIRUBIN TOTAL: 0.8 mg/dL (ref 0.3–1.2)
BUN: 8 mg/dL (ref 6–20)
CALCIUM: 10 mg/dL (ref 8.9–10.3)
CO2: 27 mmol/L (ref 22–32)
CREATININE: 0.58 mg/dL (ref 0.44–1.00)
Chloride: 102 mmol/L (ref 101–111)
Glucose, Bld: 109 mg/dL — ABNORMAL HIGH (ref 65–99)
Potassium: 3.7 mmol/L (ref 3.5–5.1)
SODIUM: 137 mmol/L (ref 135–145)
TOTAL PROTEIN: 7.3 g/dL (ref 6.5–8.1)

## 2015-06-18 LAB — CBC WITH DIFFERENTIAL/PLATELET
Basophils Absolute: 0 10*3/uL (ref 0.0–0.1)
Basophils Relative: 1 %
Eosinophils Absolute: 0.1 10*3/uL (ref 0.0–0.7)
Eosinophils Relative: 1 %
HEMATOCRIT: 37.5 % (ref 36.0–46.0)
HEMOGLOBIN: 13.2 g/dL (ref 12.0–15.0)
LYMPHS ABS: 1.5 10*3/uL (ref 0.7–4.0)
LYMPHS PCT: 25 %
MCH: 31.7 pg (ref 26.0–34.0)
MCHC: 35.2 g/dL (ref 30.0–36.0)
MCV: 89.9 fL (ref 78.0–100.0)
Monocytes Absolute: 0.8 10*3/uL (ref 0.1–1.0)
Monocytes Relative: 14 %
NEUTROS ABS: 3.6 10*3/uL (ref 1.7–7.7)
NEUTROS PCT: 59 %
Platelets: 237 10*3/uL (ref 150–400)
RBC: 4.17 MIL/uL (ref 3.87–5.11)
RDW: 20.2 % — ABNORMAL HIGH (ref 11.5–15.5)
WBC: 6.1 10*3/uL (ref 4.0–10.5)

## 2015-06-18 LAB — URINALYSIS, ROUTINE W REFLEX MICROSCOPIC
Bilirubin Urine: NEGATIVE
GLUCOSE, UA: NEGATIVE mg/dL
HGB URINE DIPSTICK: NEGATIVE
Ketones, ur: NEGATIVE mg/dL
Leukocytes, UA: NEGATIVE
Nitrite: NEGATIVE
PROTEIN: NEGATIVE mg/dL
Specific Gravity, Urine: 1.008 (ref 1.005–1.030)
Urobilinogen, UA: 0.2 mg/dL (ref 0.0–1.0)
pH: 5.5 (ref 5.0–8.0)

## 2015-06-18 LAB — LIPASE, BLOOD: Lipase: 117 U/L — ABNORMAL HIGH (ref 22–51)

## 2015-06-18 NOTE — Discharge Instructions (Signed)
Your pancreatitis appears to be getting better today on blood work. I do not suspect that you have any complications. Continue on your currently prescribed diet and take pain medications as needed for pain control. Return without fail for worsening symptoms, including worsening pain, vomiting and unable to keep down food or fluids, fevers, or any other symptoms concerning to you.  Acute Pancreatitis Acute pancreatitis is a disease in which the pancreas becomes suddenly inflamed. The pancreas is a large gland located behind your stomach. The pancreas produces enzymes that help digest food. The pancreas also releases the hormones glucagon and insulin that help regulate blood sugar. Damage to the pancreas occurs when the digestive enzymes from the pancreas are activated and begin attacking the pancreas before being released into the intestine. Most acute attacks last a couple of days and can cause serious complications. Some people become dehydrated and develop low blood pressure. In severe cases, bleeding into the pancreas can lead to shock and can be life-threatening. The lungs, heart, and kidneys may fail. CAUSES  Pancreatitis can happen to anyone. In some cases, the cause is unknown. Most cases are caused by:  Alcohol abuse.  Gallstones. Other less common causes are:  Certain medicines.  Exposure to certain chemicals.  Infection.  Damage caused by an accident (trauma).  Abdominal surgery. SYMPTOMS   Pain in the upper abdomen that may radiate to the back.  Tenderness and swelling of the abdomen.  Nausea and vomiting. DIAGNOSIS  Your caregiver will perform a physical exam. Blood and stool tests may be done to confirm the diagnosis. Imaging tests may also be done, such as X-rays, CT scans, or an ultrasound of the abdomen. TREATMENT  Treatment usually requires a stay in the hospital. Treatment may include:  Pain medicine.  Fluid replacement through an intravenous line (IV).  Placing  a tube in the stomach to remove stomach contents and control vomiting.  Not eating for 3 or 4 days. This gives your pancreas a rest, because enzymes are not being produced that can cause further damage.  Antibiotic medicines if your condition is caused by an infection.  Surgery of the pancreas or gallbladder. HOME CARE INSTRUCTIONS   Follow the diet advised by your caregiver. This may involve avoiding alcohol and decreasing the amount of fat in your diet.  Eat smaller, more frequent meals. This reduces the amount of digestive juices the pancreas produces.  Drink enough fluids to keep your urine clear or pale yellow.  Only take over-the-counter or prescription medicines as directed by your caregiver.  Avoid drinking alcohol if it caused your condition.  Do not smoke.  Get plenty of rest.  Check your blood sugar at home as directed by your caregiver.  Keep all follow-up appointments as directed by your caregiver. SEEK MEDICAL CARE IF:   You do not recover as quickly as expected.  You develop new or worsening symptoms.  You have persistent pain, weakness, or nausea.  You recover and then have another episode of pain. SEEK IMMEDIATE MEDICAL CARE IF:   You are unable to eat or keep fluids down.  Your pain becomes severe.  You have a fever or persistent symptoms for more than 2 to 3 days.  You have a fever and your symptoms suddenly get worse.  Your skin or the white part of your eyes turn yellow (jaundice).  You develop vomiting.  You feel dizzy, or you faint.  Your blood sugar is high (over 300 mg/dL). MAKE SURE YOU:  Understand these instructions.  Will watch your condition.  Will get help right away if you are not doing well or get worse.   This information is not intended to replace advice given to you by your health care provider. Make sure you discuss any questions you have with your health care provider.   Document Released: 08/24/2005 Document  Revised: 02/23/2012 Document Reviewed: 12/03/2011 Elsevier Interactive Patient Education [employer].

## 2015-06-18 NOTE — ED Provider Notes (Signed)
CSN: [id number]     Arrival date & time 06/18/15  1708 History   First MD Initiated Contact with Patient 06/18/15 1722     Chief Complaint  Patient presents with  . Abdominal Pain     (Consider location/radiation/quality/duration/timing/severity/associated sxs/prior Treatment) HPI 59 year old female who presents with abdominal pain. History of hypertension, and recently admitted to the hospital at the beginning of October for acute pancreatitis. Was discharged home with pain well controlled and she was tolerating a bland liquid diet. Has continued her diet, and today just prior to arrival had a bout of abdominal pain. This was similar to the pain associated with her acute pancreatitis. She denies any associating fever, chills, nausea, vomiting, diarrhea, melena, hematochezia, flank pain, or urinary complaints. At time of arrival in the ED, her pain had about fully resolved. She states that she just wants to make sure everything is okay. Past Medical History  Diagnosis Date  . Hypertension   . Back pain    History reviewed. No pertinent past surgical history. History reviewed. No pertinent family history. Social History  Substance Use Topics  . Smoking status: Never Smoker   . Smokeless tobacco: None  . Alcohol Use: No   OB History    No data available     Review of Systems 10/14 systems reviewed and are negative other than those stated in the HPI    Allergies  Penicillins  Home Medications   Prior to Admission medications   Medication Sig Start Date End Date Taking? Authorizing Provider  amLODipine (NORVASC) 10 MG tablet Take 1 tablet (10 mg total) by mouth daily. 04/04/15  Yes Lance Bosch, NP  atorvastatin (LIPITOR) 20 MG tablet Take 1 tablet (20 mg total) by mouth daily at 6 PM. 04/23/15  Yes Lance Bosch, NP  feeding supplement (BOOST / RESOURCE BREEZE) LIQD Take 1 Container by mouth 2 (two) times daily between meals. 06/12/15  Yes Barton Dubois, MD  fluticasone  [facility]) 50 MCG/ACT nasal spray Place 2 sprays into both nostrils daily. Patient taking differently: Place 2 sprays into both nostrils daily as needed for allergies.  04/04/15  Yes Lance Bosch, NP  lisinopril-hydrochlorothiazide (ZESTORETIC) 20-12.5 MG per tablet Take 1 tablet by mouth daily. 04/04/15  Yes Lance Bosch, NP  Multiple Vitamin (MULTIVITAMIN WITH MINERALS) TABS tablet Take 1 tablet by mouth daily. 06/12/15  Yes Barton Dubois, MD  potassium chloride SA (K-DUR,KLOR-CON) 20 MEQ tablet Take 1 tablet (20 mEq total) by mouth daily. 06/12/15  Yes Barton Dubois, MD  loratadine (CLARITIN) 10 MG tablet Take 1 tablet (10 mg total) by mouth daily. Patient not taking: Reported on 06/18/2015 08/01/14   Lance Bosch, NP  oxyCODONE (OXY IR/ROXICODONE) 5 MG immediate release tablet Take 1 tablet (5 mg total) by mouth every 8 (eight) hours as needed for severe pain. 06/12/15   Barton Dubois, MD   BP 157/97 mmHg  Pulse 73  Temp(Src) 97.8 F (36.6 C) (Oral)  Resp 16  SpO2 100% Physical Exam Physical Exam  Nursing note and vitals reviewed. Constitutional: Well developed, well nourished, non-toxic, and in no acute distress Head: Normocephalic and atraumatic.  Mouth/Throat: Oropharynx is clear and moist.  Neck: Normal range of motion. Neck supple.  Cardiovascular: Normal rate and regular rhythm.   Pulmonary/Chest: Effort normal and breath sounds normal.  Abdominal: Soft. There is minimal epigastric tenderness. There is no rebound and no guarding. No distension. Musculoskeletal: Normal range of motion.  Neurological: Alert, no facial droop,  fluent speech, moves all extremities symmetrically Skin: Skin is warm and dry.  Psychiatric: Cooperative  ED Course  Procedures (including critical care time) Labs Review Labs Reviewed  CBC WITH DIFFERENTIAL/PLATELET - Abnormal; Notable for the following:    RDW 20.2 (*)    All other components within normal limits  COMPREHENSIVE METABOLIC PANEL -  Abnormal; Notable for the following:    Glucose, Bld 109 (*)    AST 43 (*)    All other components within normal limits  LIPASE, BLOOD - Abnormal; Notable for the following:    Lipase 117 (*)    All other components within normal limits  URINALYSIS, ROUTINE W REFLEX MICROSCOPIC (NOT AT [facility])    I have personally reviewed and evaluated these images and lab results as part of my medical decision-making.   MDM   Final diagnoses:  Acute pancreatitis, unspecified pancreatitis type  Epigastric pain   59 year old female with history of recent acute pancreatitis who presents with recurrent abdominal pain. She is well-appearing in no acute distress on presentation. Vital signs are non-concerning. On exam she has a soft and minimally tender abdomen in the epigastrium. Pain has subsided without acute intervention. Remainder of exam is unremarkable and nonfocal. Blood work shows elevated lipase of 117, which has been downtrending since her admission for pancreatitis last week. No other electrolyte or metabolic derangements are noted. Normal hepatic function testing as well. She had an right upper quadrant ultrasound during her admission showing no evidence of cholelithiasis. Given her benign abdomen and improving blood work, I do not suspect complications of acute pancreatitis or other acute intra-abdominal process at this time. She is felt appropriate for discharge home. Strict return and follow-up instructions are reviewed. She expressed understanding of all discharge instructions for comfortable to plan of care.    Forde Dandy, MD 06/18/15 [place]

## 2015-06-18 NOTE — ED Notes (Signed)
Pt is aware of the need for a urine sample.  

## 2015-06-18 NOTE — ED Notes (Addendum)
Pt c/o generalized abdominal pain starting this afternoon.  Pain score 6/10.  Denies n/v/d.  Pt has not taken anything for symptoms.  Pt's daughter reports the Pt was recently diagnosed w/ pancreatitis.

## 2015-06-20 ENCOUNTER — Ambulatory Visit: Payer: Self-pay | Attending: Internal Medicine | Admitting: Internal Medicine

## 2015-06-20 ENCOUNTER — Encounter: Payer: Self-pay | Admitting: Internal Medicine

## 2015-06-20 VITALS — BP 137/86 | HR 76 | Temp 98.0°F | Resp 16 | Ht 67.0 in | Wt 235.0 lb

## 2015-06-20 DIAGNOSIS — K852 Alcohol induced acute pancreatitis without necrosis or infection: Secondary | ICD-10-CM | POA: Insufficient documentation

## 2015-06-20 DIAGNOSIS — Z23 Encounter for immunization: Secondary | ICD-10-CM | POA: Insufficient documentation

## 2015-06-20 NOTE — Progress Notes (Signed)
Patient here for follow up from the ED Was seen for dizziness and low blood pressure As well as her potassium was low

## 2015-06-20 NOTE — Progress Notes (Signed)
Patient ID: Emily Dickson, female   DOB: 1956-08-02, 59 y.o.   MRN: [id number]  CC: ER follow up  HPI: Emily Dickson is a 59 y.o. female here today for a follow up visit.  Patient has past medical history of HTN and pancreatitis. Patient was discharged from hospital on 10/5 for acute alcoholic pancreatitis with instructions to avoid alcohol and stay hydrated. She later went back to the ER 2 days ago with abdominal pain and dizziness. Repeat lipase was 117 which is elevated but trending down from recent hospital admission. There was no cause found to admit patient at the time so she was release with instructions to follow up with primary care. Today she reports that she feels much better and is tolerating a PO diet. She states that her only alcohol intake was during parties that she occasionally went to but she has no plans to every drink alcohol again. No nausea, vomiting, diarrhea, abdominal pain. She does admit to feelings of weakness.  Allergies  Allergen Reactions  . Penicillins Rash    Has patient had a PCN reaction causing immediate rash, facial/tongue/throat swelling, SOB or lightheadedness with hypotension: No Has patient had a PCN reaction causing severe rash involving mucus membranes or skin necrosis: No Has patient had a PCN reaction that required hospitalization No Has patient had a PCN reaction occurring within the last 10 years: No If all of the above answers are "NO", then may proceed with Cephalosporin use.   Past Medical History  Diagnosis Date  . Hypertension   . Back pain    Current Outpatient Prescriptions on File Prior to Visit  Medication Sig Dispense Refill  . amLODipine (NORVASC) 10 MG tablet Take 1 tablet (10 mg total) by mouth daily. 30 tablet 5  . atorvastatin (LIPITOR) 20 MG tablet Take 1 tablet (20 mg total) by mouth daily at 6 PM. 30 tablet 5  . lisinopril-hydrochlorothiazide (ZESTORETIC) 20-12.5 MG per tablet Take 1 tablet by mouth daily. 30 tablet 5  .  Multiple Vitamin (MULTIVITAMIN WITH MINERALS) TABS tablet Take 1 tablet by mouth daily.    . potassium chloride SA (K-DUR,KLOR-CON) 20 MEQ tablet Take 1 tablet (20 mEq total) by mouth daily. 30 tablet 0  . feeding supplement (BOOST / RESOURCE BREEZE) LIQD Take 1 Container by mouth 2 (two) times daily between meals.  0  . fluticasone (FLONASE) 50 MCG/ACT nasal spray Place 2 sprays into both nostrils daily. (Patient taking differently: Place 2 sprays into both nostrils daily as needed for allergies. ) 16 g 6  . loratadine (CLARITIN) 10 MG tablet Take 1 tablet (10 mg total) by mouth daily. (Patient not taking: Reported on 06/18/2015) 30 tablet 3  . oxyCODONE (OXY IR/ROXICODONE) 5 MG immediate release tablet Take 1 tablet (5 mg total) by mouth every 8 (eight) hours as needed for severe pain. 20 tablet 0  . [DISCONTINUED] lisinopril (PRINIVIL,ZESTRIL) 40 MG tablet Take 1 tablet (40 mg total) by mouth daily. 30 tablet 2   No current facility-administered medications on file prior to visit.   History reviewed. No pertinent family history. Social History   Social History  . Marital Status: Single    Spouse Name: N/A  . Number of Children: N/A  . Years of Education: N/A   Occupational History  . Not on file.   Social History Main Topics  . Smoking status: Never Smoker   . Smokeless tobacco: Not on file  . Alcohol Use: No  . Drug Use: No  .  Sexual Activity: No   Other Topics Concern  . Not on file   Social History Narrative    Review of Systems: Other than what is stated in HPI, all other systems are negative.   Objective:   Filed Vitals:   06/20/15 0916  BP: 137/86  Pulse: 76  Temp: 98 F (36.7 C)  Resp: 16    Physical Exam  Constitutional: She is oriented to person, place, and time.  Cardiovascular: Normal rate, regular rhythm and normal heart sounds.   Pulmonary/Chest: Effort normal and breath sounds normal.  Abdominal: Soft. Bowel sounds are normal. She exhibits no  distension. There is no tenderness.  Musculoskeletal: She exhibits no edema.  Neurological: She is alert and oriented to person, place, and time.  Skin: Skin is warm and dry. She is not diaphoretic.     Lab Results  Component Value Date   WBC 6.1 06/18/2015   HGB 13.2 06/18/2015   HCT 37.5 06/18/2015   MCV 89.9 06/18/2015   PLT 237 06/18/2015   Lab Results  Component Value Date   CREATININE 0.58 06/18/2015   BUN 8 06/18/2015   NA 137 06/18/2015   K 3.7 06/18/2015   CL 102 06/18/2015   CO2 27 06/18/2015    Lab Results  Component Value Date   HGBA1C 5.9* 06/10/2015   Lipid Panel     Component Value Date/Time   CHOL 213* 04/18/2015 1056   TRIG 73 04/18/2015 1056   HDL 77 04/18/2015 1056   CHOLHDL 2.8 04/18/2015 1056   VLDL 15 04/18/2015 1056   LDLCALC 121 04/18/2015 1056       Assessment and plan:   Emily Dickson was seen today for follow-up.  Diagnoses and all orders for this visit:  Acute alcoholic pancreatitis Resolving. Last lipase was 117. No symptoms. Stressed no alcohol to prevent reoccurrence   Need for influenza vaccination -     Flu Vaccine QUAD 36+ mos PF IM (Fluarix & Fluzone Quad PF)   Return in about 6 months (around 12/19/2015) for Hypertension.       Lance Bosch, [place] [facility] [phone] 06/20/2015, 9:27 AM

## 2015-07-10 ENCOUNTER — Ambulatory Visit: Payer: MEDICAID | Attending: Internal Medicine

## 2015-09-18 MED FILL — LISINOPRIL-HCTZ 20-12.5 MG: 20-12.5 | 30 days supply | Qty: 30 | Fill #3

## 2015-09-18 MED FILL — AMLODIPINE BESYLATE 10 MG T: 10 | 30 days supply | Qty: 30 | Fill #3

## 2015-09-18 MED FILL — ?ATORVASTATIN 20 MG TABLET: 20 | 30 days supply | Qty: 30 | Fill #3

## 2015-10-04 ENCOUNTER — Ambulatory Visit: Payer: Medicaid Other | Attending: Internal Medicine

## 2015-11-01 ENCOUNTER — Encounter (HOSPITAL_COMMUNITY): Payer: Self-pay

## 2015-11-01 ENCOUNTER — Emergency Department (HOSPITAL_COMMUNITY)
Admission: EM | Admit: 2015-11-01 | Discharge: 2015-11-01 | Disposition: A | Discharge status: Home / Self Care | Payer: Medicaid Other | Attending: Emergency Medicine | Admitting: Emergency Medicine

## 2015-11-01 DIAGNOSIS — Z880 Allergy status to penicillin: Secondary | ICD-10-CM | POA: Insufficient documentation

## 2015-11-01 DIAGNOSIS — I159 Secondary hypertension, unspecified: Secondary | ICD-10-CM

## 2015-11-01 DIAGNOSIS — Z79899 Other long term (current) drug therapy: Secondary | ICD-10-CM | POA: Insufficient documentation

## 2015-11-01 DIAGNOSIS — J029 Acute pharyngitis, unspecified: Secondary | ICD-10-CM

## 2015-11-01 LAB — RAPID STREP SCREEN (MED CTR MEBANE ONLY): STREPTOCOCCUS, GROUP A SCREEN (DIRECT): NEGATIVE

## 2015-11-01 MED ORDER — OXYCODONE HCL 5 MG PO TABS: 5.0000 mg | ORAL_TABLET | ORAL | Status: DC | PRN

## 2015-11-01 NOTE — Discharge Instructions (Signed)
Salt water gargles for sore throat. Tylenol and motrin for pain. Oxycodone for your chronic back pain. Please follow up with your doctor for refills. Also follow up for recheck of BP in 1 week.    Pharyngitis Pharyngitis is redness, pain, and swelling (inflammation) of your pharynx.  CAUSES  Pharyngitis is usually caused by infection. Most of the time, these infections are from viruses (viral) and are part of a cold. However, sometimes pharyngitis is caused by bacteria (bacterial). Pharyngitis can also be caused by allergies. Viral pharyngitis may be spread from person to person by coughing, sneezing, and personal items or utensils (cups, forks, spoons, toothbrushes). Bacterial pharyngitis may be spread from person to person by more intimate contact, such as kissing.  SIGNS AND SYMPTOMS  Symptoms of pharyngitis include:   Sore throat.   Tiredness (fatigue).   Low-grade fever.   Headache.  Joint pain and muscle aches.  Skin rashes.  Swollen lymph nodes.  Plaque-like film on throat or tonsils (often seen with bacterial pharyngitis). DIAGNOSIS  Your health care provider will ask you questions about your illness and your symptoms. Your medical history, along with a physical exam, is often all that is needed to diagnose pharyngitis. Sometimes, a rapid strep test is done. Other lab tests may also be done, depending on the suspected cause.  TREATMENT  Viral pharyngitis will usually get better in 3-4 days without the use of medicine. Bacterial pharyngitis is treated with medicines that kill germs (antibiotics).  HOME CARE INSTRUCTIONS   Drink enough water and fluids to keep your urine clear or pale yellow.   Only take over-the-counter or prescription medicines as directed by your health care provider:   If you are prescribed antibiotics, make sure you finish them even if you start to feel better.   Do not take aspirin.   Get lots of rest.   Gargle with 8 oz of salt water (  tsp of salt per 1 qt of water) as often as every 1-2 hours to soothe your throat.   Throat lozenges (if you are not at risk for choking) or sprays may be used to soothe your throat. SEEK MEDICAL CARE IF:   You have large, tender lumps in your neck.  You have a rash.  You cough up green, yellow-brown, or bloody spit. SEEK IMMEDIATE MEDICAL CARE IF:   Your neck becomes stiff.  You drool or are unable to swallow liquids.  You vomit or are unable to keep medicines or liquids down.  You have severe pain that does not go away with the use of recommended medicines.  You have trouble breathing (not caused by a stuffy nose). MAKE SURE YOU:   Understand these instructions.  Will watch your condition.  Will get help right away if you are not doing well or get worse.   This information is not intended to replace advice given to you by your health care provider. Make sure you discuss any questions you have with your health care provider.   Document Released: 08/24/2005 Document Revised: 06/14/2013 Document Reviewed: 05/01/2013 Elsevier Interactive Patient Education 2016 [employer].  Hypertension Hypertension is another name for high blood pressure. High blood pressure forces your heart to work harder to pump blood. A blood pressure reading has two numbers, which includes a higher number over a lower number (example: 110/72). HOME CARE   Have your blood pressure rechecked by your doctor.  Only take medicine as told by your doctor. Follow the directions carefully. The medicine  does not work as well if you skip doses. Skipping doses also puts you at risk for problems.  Do not smoke.  Monitor your blood pressure at home as told by your doctor. GET HELP IF:  You think you are having a reaction to the medicine you are taking.  You have repeat headaches or feel dizzy.  You have puffiness (swelling) in your ankles.  You have trouble with your vision. GET HELP RIGHT AWAY IF:    You get a very bad headache and are confused.  You feel weak, numb, or faint.  You get chest or belly (abdominal) pain.  You throw up (vomit).  You cannot breathe very well. MAKE SURE YOU:   Understand these instructions.  Will watch your condition.  Will get help right away if you are not doing well or get worse.   This information is not intended to replace advice given to you by your health care provider. Make sure you discuss any questions you have with your health care provider.   Document Released: 02/10/2008 Document Revised: 08/29/2013 Document Reviewed: 06/16/2013 Elsevier Interactive Patient Education [employer].

## 2015-11-01 NOTE — ED Provider Notes (Signed)
CSN: [id number]     Arrival date & time 11/01/15  1557 History   First MD Initiated Contact with Patient 11/01/15 1924     Chief Complaint  Patient presents with  . Sore Throat  . Hypertension     (Consider location/radiation/quality/duration/timing/severity/associated sxs/prior Treatment) HPI Emily Dickson is a 60 y.o. female with history of hypertension, presents to emergency department complaining of sore throat and elevated blood pressure. Patient states that she has had sore throat for 3 days. She denies any fever or chills. No nasal congestion. No cough. She states that she was not feeling well and took her blood pressure and it was 160/98. She decided to come to the emergency department. She did not take any medications for her sore throat prior to coming in. Patient does take blood pressure medication and states that she has not missed any doses. She denies any associated dizziness, chest pain, shortness of breath, headache, extremity swelling.  Past Medical History  Diagnosis Date  . Hypertension   . Back pain    History reviewed. No pertinent past surgical history. History reviewed. No pertinent family history. Social History  Substance Use Topics  . Smoking status: Never Smoker   . Smokeless tobacco: Never Used  . Alcohol Use: No   OB History    No data available     Review of Systems  Constitutional: Negative for fever and chills.  HENT: Positive for sore throat. Negative for congestion and trouble swallowing.   Respiratory: Negative for cough, chest tightness and shortness of breath.   Cardiovascular: Negative for chest pain, palpitations and leg swelling.  Genitourinary: Negative for dysuria, flank pain and pelvic pain.  Musculoskeletal: Negative for myalgias, arthralgias, neck pain and neck stiffness.  Skin: Negative for rash.  Neurological: Negative for dizziness, weakness and headaches.  All other systems reviewed and are negative.     Allergies   Penicillins  Home Medications   Prior to Admission medications   Medication Sig Start Date End Date Taking? Authorizing Provider  amLODipine (NORVASC) 10 MG tablet Take 1 tablet (10 mg total) by mouth Dickson. 04/04/15  Yes Lance Bosch, NP  atorvastatin (LIPITOR) 20 MG tablet Take 1 tablet (20 mg total) by mouth Dickson at 6 PM. 04/23/15  Yes Lance Bosch, NP  fluticasone (FLONASE) 50 MCG/ACT nasal spray Place 2 sprays into both nostrils Dickson. Patient taking differently: Place 2 sprays into both nostrils Dickson as needed for allergies.  04/04/15  Yes Lance Bosch, NP  lisinopril-hydrochlorothiazide (ZESTORETIC) 20-12.5 MG per tablet Take 1 tablet by mouth Dickson. 04/04/15  Yes Lance Bosch, NP  loratadine (CLARITIN) 10 MG tablet Take 1 tablet (10 mg total) by mouth Dickson. Patient taking differently: Take 10 mg by mouth Dickson as needed for allergies.  08/01/14  Yes Lance Bosch, NP  feeding supplement (BOOST / RESOURCE BREEZE) LIQD Take 1 Container by mouth 2 (two) times Dickson between meals. Patient not taking: Reported on 11/01/2015 06/12/15   Barton Dubois, MD  Multiple Vitamin (MULTIVITAMIN WITH MINERALS) TABS tablet Take 1 tablet by mouth Dickson. Patient not taking: Reported on 11/01/2015 06/12/15   Barton Dubois, MD  oxyCODONE (OXY IR/ROXICODONE) 5 MG immediate release tablet Take 1 tablet (5 mg total) by mouth every 8 (eight) hours as needed for severe pain. Patient not taking: Reported on 11/01/2015 06/12/15   Barton Dubois, MD  potassium chloride SA (K-DUR,KLOR-CON) 20 MEQ tablet Take 1 tablet (20 mEq total) by mouth Dickson. Patient not  taking: Reported on 11/01/2015 06/12/15   Barton Dubois, MD   BP 164/89 mmHg  Pulse 90  Temp(Src) 98.1 F (36.7 C) (Oral)  Resp 20  SpO2 98% Physical Exam  Constitutional: She is oriented to person, place, and time. She appears well-developed and well-nourished. No distress.  HENT:  Head: Normocephalic.  Right Ear: Tympanic membrane, external ear and  ear canal normal.  Left Ear: Tympanic membrane, external ear and ear canal normal.  Nose: Nose normal.  Mouth/Throat: Uvula is midline and mucous membranes are normal. Posterior oropharyngeal erythema present. No oropharyngeal exudate, posterior oropharyngeal edema or tonsillar abscesses.  Eyes: Conjunctivae are normal.  Neck: Neck supple.  Cardiovascular: Normal rate, regular rhythm and normal heart sounds.   Pulmonary/Chest: Effort normal and breath sounds normal. No respiratory distress. She has no wheezes. She has no rales.  Musculoskeletal: She exhibits no edema.  Lymphadenopathy:    She has no cervical adenopathy.  Neurological: She is alert and oriented to person, place, and time.  Skin: Skin is warm and dry.  Psychiatric: She has a normal mood and affect. Her behavior is normal.  Nursing note and vitals reviewed.   ED Course  Procedures (including critical care time) Labs Review Labs Reviewed  RAPID STREP SCREEN (NOT AT [facility])  CULTURE, GROUP A STREP [facility])    Imaging Review No results found. I have personally reviewed and evaluated these images and lab results as part of my medical decision-making.   EKG Interpretation None      MDM   Final diagnoses:  Pharyngitis  Secondary hypertension, unspecified    Pt with sore throat and elevated blood pressure. BP here 141/76. Oropharynx is erythematous. Tonsils appeared to be normal with no exudate. Uvula is  non-edematous and midline. will get strep.  Screen is negative. Blood pressure 148/74 on recheck. Most likely viral pharyngitis. Will treat symptomatically with ibuprofen, Tylenol, salt water gargles, follow-up. I does state in the room patient asked me to give her refill of her oxycodone which she takes for chronic back pain. She states she is unable to see a doctor since its the weekend. Will give 10 tablets. Instructed that she needs to follow-up to get those refills.  Filed Vitals:   11/01/15 1609 11/01/15 1928  11/01/15 2103  BP: 141/76 164/89 148/74  Pulse: 75 90 77  Temp: 98.1 F (36.7 C)    TempSrc: Oral    Resp: 20 20 18   SpO2: 100% 98% 98%     Jeannett Senior, PA-C 11/01/15 2355  Varney Biles, MD 11/02/15 2132

## 2015-11-01 NOTE — ED Notes (Signed)
PA at bedside.

## 2015-11-01 NOTE — ED Notes (Signed)
Patient c/o sore throat x 3 days. Patient denies any cough or fever. Patient states she took her BP at home and it was 160/98. Patient states she took her high BP med today.

## 2015-11-04 LAB — CULTURE, GROUP A STREP (THRC)

## 2015-11-05 MED FILL — ATORVASTATIN 20 MG TABLET: 20 | 30 days supply | Qty: 30 | Fill #4

## 2015-11-05 MED FILL — LISINOPRIL-HCTZ 20-12.5 MG: 20-12.5 | 30 days supply | Qty: 30 | Fill #4

## 2015-11-05 MED FILL — ?AMLODIPINE BESYLATE 10 MG: 10 | 30 days supply | Qty: 30 | Fill #4

## 2015-11-13 ENCOUNTER — Encounter: Payer: Self-pay | Admitting: Internal Medicine

## 2015-11-13 ENCOUNTER — Ambulatory Visit: Payer: No Typology Code available for payment source | Attending: Internal Medicine | Admitting: Internal Medicine

## 2015-11-13 VITALS — BP 123/81 | HR 72 | Temp 98.3°F | Resp 15 | Ht 67.0 in | Wt 254.6 lb

## 2015-11-13 DIAGNOSIS — J309 Allergic rhinitis, unspecified: Secondary | ICD-10-CM | POA: Insufficient documentation

## 2015-11-13 DIAGNOSIS — Z79899 Other long term (current) drug therapy: Secondary | ICD-10-CM | POA: Insufficient documentation

## 2015-11-13 DIAGNOSIS — M545 Low back pain, unspecified: Secondary | ICD-10-CM

## 2015-11-13 DIAGNOSIS — E785 Hyperlipidemia, unspecified: Secondary | ICD-10-CM | POA: Insufficient documentation

## 2015-11-13 DIAGNOSIS — I10 Essential (primary) hypertension: Secondary | ICD-10-CM | POA: Insufficient documentation

## 2015-11-13 MED ORDER — TRAMADOL HCL 50 MG PO TABS: 50.0000 mg | ORAL_TABLET | Freq: Two times a day (BID) | ORAL | Status: DC | PRN

## 2015-11-13 MED ORDER — ATORVASTATIN CALCIUM 20 MG PO TABS: 20.0000 mg | ORAL_TABLET | Freq: Every day | ORAL | Status: DC

## 2015-11-13 MED ORDER — AMLODIPINE BESYLATE 10 MG PO TABS: 10.0000 mg | ORAL_TABLET | Freq: Every day | ORAL | Status: DC

## 2015-11-13 MED ORDER — LISINOPRIL-HYDROCHLOROTHIAZIDE 20-12.5 MG PO TABS: 1.0000 | ORAL_TABLET | Freq: Every day | ORAL | Status: DC

## 2015-11-13 MED ORDER — LORATADINE 10 MG PO TABS: 10.0000 mg | ORAL_TABLET | Freq: Every day | ORAL | Status: DC

## 2015-11-13 NOTE — Progress Notes (Signed)
Patient ID: Emily Dickson, female   DOB: 14-Aug-1956, 60 y.o.   MRN: [id number] Subjective:  Emily Dickson is a 60 y.o. female with hypertension. Patient reports that she was evaluated in the ED one week ago for symptoms of sore throat and HTN. She states that she takes her anti-hypertensive medications daily without complications. No other complaints today.   Current Outpatient Prescriptions  Medication Sig Dispense Refill  . amLODipine (NORVASC) 10 MG tablet Take 1 tablet (10 mg total) by mouth daily. 30 tablet 5  . atorvastatin (LIPITOR) 20 MG tablet Take 1 tablet (20 mg total) by mouth daily at 6 PM. 30 tablet 5  . lisinopril-hydrochlorothiazide (ZESTORETIC) 20-12.5 MG per tablet Take 1 tablet by mouth daily. 30 tablet 5  . oxyCODONE (ROXICODONE) 5 MG immediate release tablet Take 1 tablet (5 mg total) by mouth every 4 (four) hours as needed for severe pain. 10 tablet 0  . feeding supplement (BOOST / RESOURCE BREEZE) LIQD Take 1 Container by mouth 2 (two) times daily between meals. (Patient not taking: Reported on 11/01/2015)  0  . fluticasone (FLONASE) 50 MCG/ACT nasal spray Place 2 sprays into both nostrils daily. (Patient not taking: Reported on 11/13/2015) 16 g 6  . loratadine (CLARITIN) 10 MG tablet Take 1 tablet (10 mg total) by mouth daily. (Patient not taking: Reported on 11/13/2015) 30 tablet 3  . Multiple Vitamin (MULTIVITAMIN WITH MINERALS) TABS tablet Take 1 tablet by mouth daily. (Patient not taking: Reported on 11/01/2015)    . potassium chloride SA (K-DUR,KLOR-CON) 20 MEQ tablet Take 1 tablet (20 mEq total) by mouth daily. (Patient not taking: Reported on 11/01/2015) 30 tablet 0  . [DISCONTINUED] lisinopril (PRINIVIL,ZESTRIL) 40 MG tablet Take 1 tablet (40 mg total) by mouth daily. 30 tablet 2   No current facility-administered medications for this visit.    Hypertension ROS: taking medications as instructed, no medication side effects noted, no TIA's, no chest pain on exertion,  no dyspnea on exertion, no swelling of ankles and no palpitations.   Objective:  BP 123/81 mmHg  Pulse 72  Temp(Src) 98.3 F (36.8 C)  Resp 15  Ht 5\' 7"  (1.702 m)  Wt 254 lb 9.6 oz (115.486 kg)  BMI 39.87 kg/m2  SpO2 96%  Appearance alert, well appearing, and in no distress, oriented to person, place, and time and overweight. General exam BP noted to be well controlled today in office, S1, S2 normal, no gallop, no murmur, chest clear, no JVD, no HSM, no edema.  Lab review: labs are reviewed, up to date and normal.   Assessment:   Emily Dickson was seen today for follow-up.  Diagnoses and all orders for this visit:  Essential hypertension -     amLODipine (NORVASC) 10 MG tablet; Take 1 tablet (10 mg total) by mouth daily. -     lisinopril-hydrochlorothiazide (ZESTORETIC) 20-12.5 MG tablet; Take 1 tablet by mouth daily. Patient blood pressure is stable and may continue on current medication.  Education on diet, exercise, and modifiable risk factors discussed. Will obtain appropriate labs as needed. Will follow up in 3-6 months.   HLD (hyperlipidemia) -     atorvastatin (LIPITOR) 20 MG tablet; Take 1 tablet (20 mg total) by mouth daily at 6 PM. Education provided on proper lifestyle changes in order to lower cholesterol. Patient advised to maintain healthy weight and to keep total fat intake at 25-35% of total calories and carbohydrates 50-60% of total daily calories. Explained how high cholesterol places patient at  risk for heart disease. Patient placed on appropriate medication and repeat labs in 6 months   Allergic rhinitis, unspecified allergic rhinitis type -     loratadine (CLARITIN) 10 MG tablet; Take 1 tablet (10 mg total) by mouth daily. Stable, meds refilled  Midline low back pain without sciatica -     traMADol (ULTRAM) 50 MG tablet; Take 1 tablet (50 mg total) by mouth every 12 (twelve) hours as needed. Stable meds refilled  Return in about 3 months (around 02/13/2016) for  Hypertension.  Lance Bosch, NP 11/13/2015 5:28 PM

## 2015-11-13 NOTE — Progress Notes (Signed)
Patient here to follow up on ED visit for HTN She needs refill on her loratadine

## 2015-12-04 MED FILL — LISINOPRIL-HCTZ 20-12.5 MG: 20-12.5 | 30 days supply | Qty: 30 | Fill #5

## 2015-12-04 MED FILL — AMLODIPINE BESYLATE 10 MG T: 10 | 30 days supply | Qty: 30 | Fill #5

## 2015-12-04 MED FILL — ATORVASTATIN 20 MG TABLET: 20 | 30 days supply | Qty: 30 | Fill #5

## 2016-01-03 ENCOUNTER — Other Ambulatory Visit: Payer: Self-pay | Admitting: Internal Medicine

## 2016-01-03 MED FILL — ATORVASTATIN 20 MG TABLET: 20 | 30 days supply | Qty: 30 | Fill #0

## 2016-01-13 MED FILL — LISINOPRIL-HCTZ 20-12.5 MG: 20-12.5 | 30 days supply | Qty: 30 | Fill #0

## 2016-01-14 MED FILL — AMLODIPINE BESYLATE 10 MG T: 10 | 30 days supply | Qty: 30 | Fill #0

## 2016-02-27 MED FILL — ATORVASTATIN 20 MG TABLET: 20 | 30 days supply | Qty: 30 | Fill #1

## 2016-02-27 MED FILL — AMLODIPINE BESYLATE 10 MG T: 10 | 30 days supply | Qty: 30 | Fill #1

## 2016-02-27 MED FILL — LISINOPRIL-HCTZ 20-12.5 MG: 20-12.5 | 30 days supply | Qty: 30 | Fill #1

## 2016-03-07 ENCOUNTER — Emergency Department (HOSPITAL_COMMUNITY): Payer: Self-pay

## 2016-03-07 ENCOUNTER — Encounter (HOSPITAL_COMMUNITY): Payer: Self-pay

## 2016-03-07 ENCOUNTER — Emergency Department (HOSPITAL_COMMUNITY)
Admission: EM | Admit: 2016-03-07 | Discharge: 2016-03-07 | Disposition: A | Discharge status: Home / Self Care | Payer: Self-pay | Attending: Emergency Medicine | Admitting: Emergency Medicine

## 2016-03-07 DIAGNOSIS — Z79899 Other long term (current) drug therapy: Secondary | ICD-10-CM | POA: Insufficient documentation

## 2016-03-07 DIAGNOSIS — E785 Hyperlipidemia, unspecified: Secondary | ICD-10-CM | POA: Insufficient documentation

## 2016-03-07 DIAGNOSIS — I10 Essential (primary) hypertension: Secondary | ICD-10-CM | POA: Insufficient documentation

## 2016-03-07 DIAGNOSIS — M25561 Pain in right knee: Secondary | ICD-10-CM | POA: Insufficient documentation

## 2016-03-07 DIAGNOSIS — Z6835 Body mass index (BMI) 35.0-35.9, adult: Secondary | ICD-10-CM | POA: Insufficient documentation

## 2016-03-07 DIAGNOSIS — E669 Obesity, unspecified: Secondary | ICD-10-CM | POA: Insufficient documentation

## 2016-03-07 DIAGNOSIS — R0789 Other chest pain: Secondary | ICD-10-CM | POA: Insufficient documentation

## 2016-03-07 HISTORY — DX: Pure hypercholesterolemia, unspecified: E78.00

## 2016-03-07 LAB — I-STAT TROPONIN, ED: Troponin i, poc: 0.01 ng/mL (ref 0.00–0.08)

## 2016-03-07 LAB — BASIC METABOLIC PANEL
ANION GAP: 7 (ref 5–15)
BUN: 11 mg/dL (ref 6–20)
CALCIUM: 9.5 mg/dL (ref 8.9–10.3)
CHLORIDE: 102 mmol/L (ref 101–111)
CO2: 30 mmol/L (ref 22–32)
CREATININE: 0.5 mg/dL (ref 0.44–1.00)
Glucose, Bld: 113 mg/dL — ABNORMAL HIGH (ref 65–99)
POTASSIUM: 3.7 mmol/L (ref 3.5–5.1)
SODIUM: 139 mmol/L (ref 135–145)

## 2016-03-07 LAB — CBC
HEMATOCRIT: 40.5 % (ref 36.0–46.0)
Hemoglobin: 12.7 g/dL (ref 12.0–15.0)
MCH: 27.9 pg (ref 26.0–34.0)
MCHC: 31.4 g/dL (ref 30.0–36.0)
MCV: 88.8 fL (ref 78.0–100.0)
PLATELETS: 231 10*3/uL (ref 150–400)
RBC: 4.56 MIL/uL (ref 3.87–5.11)
RDW: 15.8 % — AB (ref 11.5–15.5)
WBC: 3.9 10*3/uL — ABNORMAL LOW (ref 4.0–10.5)

## 2016-03-07 NOTE — Discharge Instructions (Signed)
There does not appear to be an emergent cause for your symptoms at this time. Your exam and x-rays were reassuring. Use your Ace wrap as we discussed. You may use OTC medications as prescribed by your PCP. Please follow-up with your PCP in the next 1-2 days for reevaluation. Return to ED for any new or worsening symptoms.  Joint Pain Joint pain, which is also called arthralgia, can be caused by many things. Joint pain often goes away when you follow your health care provider's instructions for relieving pain at home. However, joint pain can also be caused by conditions that require further treatment. Common causes of joint pain include:  Bruising in the area of the joint.  Overuse of the joint.  Wear and tear on the joints that occur with aging (osteoarthritis).  Various other forms of arthritis.  A buildup of a crystal form of uric acid in the joint (gout).  Infections of the joint (septic arthritis) or of the bone (osteomyelitis). Your health care provider may recommend medicine to help with the pain. If your joint pain continues, additional tests may be needed to diagnose your condition. HOME CARE INSTRUCTIONS Watch your condition for any changes. Follow these instructions as directed to lessen the pain that you are feeling.  Take medicines only as directed by your health care provider.  Rest the affected area for as long as your health care provider says that you should. If directed to do so, raise the painful joint above the level of your heart while you are sitting or lying down.  Do not do things that cause or worsen pain.  If directed, apply ice to the painful area:  Put ice in a plastic bag.  Place a towel between your skin and the bag.  Leave the ice on for 20 minutes, 2-3 times per day.  Wear an elastic bandage, splint, or sling as directed by your health care provider. Loosen the elastic bandage or splint if your fingers or toes become numb and tingle, or if they turn  cold and blue.  Begin exercising or stretching the affected area as directed by your health care provider. Ask your health care provider what types of exercise are safe for you.  Keep all follow-up visits as directed by your health care provider. This is important. SEEK MEDICAL CARE IF:  Your pain increases, and medicine does not help.  Your joint pain does not improve within 3 days.  You have increased bruising or swelling.  You have a fever.  You lose 10 lb (4.5 kg) or more without trying. SEEK IMMEDIATE MEDICAL CARE IF:  You are not able to move the joint.  Your fingers or toes become numb or they turn cold and blue.   This information is not intended to replace advice given to you by your health care provider. Make sure you discuss any questions you have with your health care provider.   Document Released: 08/24/2005 Document Revised: 09/14/2014 Document Reviewed: 06/05/2014 Elsevier Interactive Patient Education [employer].

## 2016-03-07 NOTE — ED Provider Notes (Signed)
CSN: [id number]     Arrival date & time 03/07/16  0940 History   First MD Initiated Contact with Patient 03/07/16 1001     Chief Complaint  Patient presents with  . Chest Pain  . Knee Pain     (Consider location/radiation/quality/duration/timing/severity/associated sxs/prior Treatment) HPI Emily Dickson is a 60 y.o. female with history of hypertension, hyperlipidemia, obesity here for evaluation of chest and knee pain. Patient reports for the past one week she has had intermittent swelling and pain on the inside her right knee. Worse with activity and walking, better with rest and elevation. She reports she experiences intermittent relief with icy hot. Denies any unilateral leg swelling, cough, hemoptysis, thigh pain, recent travel or surgeries, history of blood clot, OCPs. She reports while her right knee was hurting this morning she also experienced concomitant right sided chest discomfort that she cannot characterize but attributes it to the pain in her right knee. She reports once the pain in her knee subsided, her discomfort resolved. She refuses further workup of chest discomfort now in the emergency department.  Past Medical History  Diagnosis Date  . Hypertension   . Back pain   . High cholesterol    History reviewed. No pertinent past surgical history. No family history on file. Social History  Substance Use Topics  . Smoking status: Never Smoker   . Smokeless tobacco: Never Used  . Alcohol Use: No   OB History    No data available     Review of Systems A 10 point review of systems was completed and was negative except for pertinent positives and negatives as mentioned in the history of present illness     Allergies  Penicillins  Home Medications   Prior to Admission medications   Medication Sig Start Date End Date Taking? Authorizing Provider  amLODipine (NORVASC) 10 MG tablet Take 1 tablet (10 mg total) by mouth daily. 11/13/15   Lance Bosch, NP   atorvastatin (LIPITOR) 20 MG tablet Take 1 tablet (20 mg total) by mouth daily at 6 PM. 11/13/15   Lance Bosch, NP  feeding supplement (BOOST / RESOURCE BREEZE) LIQD Take 1 Container by mouth 2 (two) times daily between meals. Patient not taking: Reported on 11/01/2015 06/12/15   Barton Dubois, MD  fluticasone [facility]) 50 MCG/ACT nasal spray Place 2 sprays into both nostrils daily. Patient not taking: Reported on 11/13/2015 04/04/15   Lance Bosch, NP  lisinopril-hydrochlorothiazide (ZESTORETIC) 20-12.5 MG tablet Take 1 tablet by mouth daily. 11/13/15   Lance Bosch, NP  loratadine (CLARITIN) 10 MG tablet Take 1 tablet (10 mg total) by mouth daily. 11/13/15   Lance Bosch, NP  Multiple Vitamin (MULTIVITAMIN WITH MINERALS) TABS tablet Take 1 tablet by mouth daily. Patient not taking: Reported on 11/01/2015 06/12/15   Barton Dubois, MD  oxyCODONE (ROXICODONE) 5 MG immediate release tablet Take 1 tablet (5 mg total) by mouth every 4 (four) hours as needed for severe pain. 11/01/15   Tatyana Kirichenko, PA-C  potassium chloride SA (K-DUR,KLOR-CON) 20 MEQ tablet Take 1 tablet (20 mEq total) by mouth daily. Patient not taking: Reported on 11/01/2015 06/12/15   Barton Dubois, MD  traMADol (ULTRAM) 50 MG tablet Take 1 tablet (50 mg total) by mouth every 12 (twelve) hours as needed. 11/13/15   Lance Bosch, NP   BP 149/84 mmHg  Pulse 66  Temp(Src) 99 F (37.2 C) (Oral)  Resp 16  Ht 5\' 7"  (1.702 m)  Wt  103.42 kg  BMI 35.70 kg/m2  SpO2 98% Physical Exam  Constitutional: She is oriented to person, place, and time. She appears well-developed and well-nourished.  Overall well-appearing, in no apparent distress. Morbidly obese  HENT:  Head: Normocephalic and atraumatic.  Mouth/Throat: Oropharynx is clear and moist.  Eyes: Conjunctivae are normal. Pupils are equal, round, and reactive to light. Right eye exhibits no discharge. Left eye exhibits no discharge. No scleral icterus.  Neck: Neck supple.   Cardiovascular: Normal rate, regular rhythm and normal heart sounds.   Pulmonary/Chest: Effort normal and breath sounds normal. No respiratory distress. She has no wheezes. She has no rales. She exhibits tenderness.  Right-sided anterior chest wall tenderness to palpation, replicates and exacerbates patient's previous discomfort.  Abdominal: Soft. There is no tenderness.  Musculoskeletal:  Right knee: Full active range of motion. Mild tenderness palpation on medial joint line. No crepitus or other focal bony tenderness. No erythema, overt edema or warmth. Distal pulses intact brisk cap refill.  Neurological: She is alert and oriented to person, place, and time.  Cranial Nerves II-XII grossly intact  Skin: Skin is warm and dry. No rash noted.  Psychiatric: She has a normal mood and affect.  Nursing note and vitals reviewed.   ED Course  Procedures (including critical care time) Labs Review Labs Reviewed  BASIC METABOLIC PANEL - Abnormal; Notable for the following:    Glucose, Bld 113 (*)    All other components within normal limits  CBC - Abnormal; Notable for the following:    WBC 3.9 (*)    RDW 15.8 (*)    All other components within normal limits  I-STAT TROPOININ, ED    Imaging Review Dg Chest 2 View  03/07/2016  CLINICAL DATA:  Right-sided chest pain for 2 days EXAM: CHEST  2 VIEW COMPARISON:  06/10/2015 FINDINGS: The heart size and mediastinal contours are within normal limits. Both lungs are clear. The visualized skeletal structures are unremarkable. IMPRESSION: No active cardiopulmonary disease. Electronically Signed   By: Inez Catalina M.D.   On: 03/07/2016 10:39   Dg Knee Complete 4 Views Right  03/07/2016  CLINICAL DATA:  Right knee pain and swelling for 1 week, no known injury, initial encounter EXAM: RIGHT KNEE - COMPLETE 4+ VIEW COMPARISON:  None. FINDINGS: No evidence of fracture, dislocation, or joint effusion. No evidence of arthropathy or other focal bone abnormality.  Soft tissues are unremarkable. IMPRESSION: No acute abnormality noted. Electronically Signed   By: Inez Catalina M.D.   On: 03/07/2016 10:39   I have personally reviewed and evaluated these images and lab results as part of my medical decision-making.   EKG Interpretation   Date/Time:  Saturday March 07 2016 09:50:40 EDT Ventricular Rate:  75 PR Interval:  178 QRS Duration: 94 QT Interval:  400 QTC Calculation: 446 R Axis:   31 Text Interpretation:  Normal sinus rhythm Nonspecific T wave abnormality  Abnormal ECG No significant change since last tracing Confirmed by Canary Brim   MD, MARTHA [phone]) on 03/07/2016 11:13:28 AM      MDM  Patient presents with one-week history of intermittent right knee pain. On exam, she does have mild tenderness to palpation of her medial joint line, no effusion, erythema, warmth or other unilateral leg swelling. Low well score for DVT. Suspect MSK etiology. She declines further workup of her chest discomfort, and it is easily reproducible on exam with palpation of her right chest wall. Doubt ACS, PE or other emergent cardio pulmonary pathology.  EKG reassuring. X-ray of both chest and right knee are negative. Will initiate NSAID therapy, Ace wrap and follow up with PCP next week. Final diagnoses:  Right medial knee pain        Comer Locket, PA-C 03/07/16 [place], MD 03/07/16 1416

## 2016-03-07 NOTE — ED Notes (Addendum)
Patient complains of right sided chest pain that she describes as non-radiating and sharp x 2 days. Also concerned with her right knee, pain and swelling x 1 week, denies trauma.  EKG performed on arrival

## 2016-03-11 ENCOUNTER — Encounter: Payer: Self-pay | Admitting: Family Medicine

## 2016-03-11 ENCOUNTER — Ambulatory Visit: Payer: No Typology Code available for payment source | Attending: Family Medicine | Admitting: Family Medicine

## 2016-03-11 VITALS — BP 128/82 | HR 66 | Temp 98.7°F | Resp 20 | Ht 67.0 in | Wt 260.0 lb

## 2016-03-11 DIAGNOSIS — R609 Edema, unspecified: Secondary | ICD-10-CM | POA: Insufficient documentation

## 2016-03-11 DIAGNOSIS — Z79899 Other long term (current) drug therapy: Secondary | ICD-10-CM | POA: Insufficient documentation

## 2016-03-11 DIAGNOSIS — E7800 Pure hypercholesterolemia, unspecified: Secondary | ICD-10-CM | POA: Insufficient documentation

## 2016-03-11 DIAGNOSIS — E785 Hyperlipidemia, unspecified: Secondary | ICD-10-CM | POA: Insufficient documentation

## 2016-03-11 DIAGNOSIS — M25561 Pain in right knee: Secondary | ICD-10-CM | POA: Insufficient documentation

## 2016-03-11 DIAGNOSIS — R600 Localized edema: Secondary | ICD-10-CM

## 2016-03-11 DIAGNOSIS — I10 Essential (primary) hypertension: Secondary | ICD-10-CM | POA: Insufficient documentation

## 2016-03-11 LAB — COMPLETE METABOLIC PANEL WITH GFR
ALBUMIN: 4.2 g/dL (ref 3.6–5.1)
ALK PHOS: 66 U/L (ref 33–130)
ALT: 13 U/L (ref 6–29)
AST: 12 U/L (ref 10–35)
BUN: 12 mg/dL (ref 7–25)
CALCIUM: 9.4 mg/dL (ref 8.6–10.4)
CHLORIDE: 102 mmol/L (ref 98–110)
CO2: 31 mmol/L (ref 20–31)
Creat: 0.53 mg/dL (ref 0.50–1.05)
Glucose, Bld: 101 mg/dL — ABNORMAL HIGH (ref 65–99)
POTASSIUM: 4 mmol/L (ref 3.5–5.3)
Sodium: 139 mmol/L (ref 135–146)
Total Bilirubin: 0.6 mg/dL (ref 0.2–1.2)
Total Protein: 7.4 g/dL (ref 6.1–8.1)

## 2016-03-11 LAB — LIPID PANEL
CHOL/HDL RATIO: 2.6 ratio (ref <=5.0)
CHOLESTEROL: 151 mg/dL (ref 125–200)
HDL: 58 mg/dL (ref >=46)
LDL Cholesterol: 80 mg/dL (ref <130)
TRIGLYCERIDES: 63 mg/dL (ref <150)
VLDL: 13 mg/dL (ref <30)

## 2016-03-11 MED ORDER — LISINOPRIL-HYDROCHLOROTHIAZIDE 20-12.5 MG PO TABS: 2.0000 | ORAL_TABLET | Freq: Every day | ORAL | Status: DC

## 2016-03-11 MED ORDER — ATORVASTATIN CALCIUM 20 MG PO TABS: 20.0000 mg | ORAL_TABLET | Freq: Every day | ORAL | Status: DC

## 2016-03-11 MED ORDER — NAPROXEN 500 MG PO TABS: 500.0000 mg | ORAL_TABLET | Freq: Two times a day (BID) | ORAL | Status: DC

## 2016-03-11 MED FILL — NAPROXEN 500 MG TABLET: 500 | 30 days supply | Qty: 60 | Fill #0

## 2016-03-11 MED FILL — LISINOPRIL-HCTZ 20-12.5 MG: 20-12.5 | 30 days supply | Qty: 60 | Fill #0

## 2016-03-11 NOTE — Progress Notes (Signed)
Subjective:  Patient ID: Emily Dickson, female    DOB: 1956/08/23  Age: 60 y.o. MRN: [id number]  CC: follow up visit  HPI Emily Dickson is a 60 year old female with a history of hypertension, hyperlipidemia who comes into the clinic for follow-up visit.  She complains of a three-week history of right knee pain for which she was seen at the ED and knee x-ray was unremarkable. Also complains of intermittent pedal edema which is not related to position and denies shortness of breath, chest pains orthopnea.  Has been compliant with her antihypertensive and statin and is working on a low-sodium diet and low-cholesterol diet. Does not exercise regularly.   Past Medical History  Diagnosis Date  . Hypertension   . Back pain   . High cholesterol     History reviewed. No pertinent past surgical history.   Outpatient Prescriptions Prior to Visit  Medication Sig Dispense Refill  . atorvastatin (LIPITOR) 20 MG tablet Take 1 tablet (20 mg total) by mouth daily at 6 PM. 30 tablet 5  . fluticasone (FLONASE) 50 MCG/ACT nasal spray Place 2 sprays into both nostrils daily. 16 g 6  . loratadine (CLARITIN) 10 MG tablet Take 1 tablet (10 mg total) by mouth daily. 30 tablet 3  . Multiple Vitamin (MULTIVITAMIN WITH MINERALS) TABS tablet Take 1 tablet by mouth daily.    Marland Kitchen amLODipine (NORVASC) 10 MG tablet Take 1 tablet (10 mg total) by mouth daily. 30 tablet 5  . lisinopril-hydrochlorothiazide (ZESTORETIC) 20-12.5 MG tablet Take 1 tablet by mouth daily. 30 tablet 5  . potassium chloride SA (K-DUR,KLOR-CON) 20 MEQ tablet Take 1 tablet (20 mEq total) by mouth daily. (Patient not taking: Reported on 11/01/2015) 30 tablet 0  . traMADol (ULTRAM) 50 MG tablet Take 1 tablet (50 mg total) by mouth every 12 (twelve) hours as needed. (Patient not taking: Reported on 03/11/2016) 60 tablet 0  . feeding supplement (BOOST / RESOURCE BREEZE) LIQD Take 1 Container by mouth 2 (two) times daily between meals. (Patient  not taking: Reported on 11/01/2015)  0  . oxyCODONE (ROXICODONE) 5 MG immediate release tablet Take 1 tablet (5 mg total) by mouth every 4 (four) hours as needed for severe pain. (Patient not taking: Reported on 03/11/2016) 10 tablet 0   No facility-administered medications prior to visit.    ROS Review of Systems  Constitutional: Negative for activity change, appetite change and fatigue.  HENT: Negative for congestion, sinus pressure and sore throat.   Eyes: Negative for visual disturbance.  Respiratory: Negative for cough, chest tightness, shortness of breath and wheezing.   Cardiovascular: Positive for leg swelling. Negative for chest pain and palpitations.  Gastrointestinal: Negative for abdominal pain, constipation and abdominal distention.  Endocrine: Negative for polydipsia.  Genitourinary: Negative for dysuria and frequency.  Musculoskeletal: Negative for back pain and arthralgias.       Right knee pain  Skin: Negative for rash.  Neurological: Negative for tremors, light-headedness and numbness.  Hematological: Does not bruise/bleed easily.  Psychiatric/Behavioral: Negative for behavioral problems and agitation.    Objective:  BP 128/82 mmHg  Pulse 66  Temp(Src) 98.7 F (37.1 C) (Oral)  Resp 20  Ht 5\' 7"  (1.702 m)  Wt 260 lb (117.935 kg)  BMI 40.71 kg/m2  SpO2 96%  BP/Weight 03/11/2016 [id number]  Systolic BP 0000000 [id number]  Diastolic BP 82 84 81  Wt. (Lbs) 260 228 254.6  BMI 40.71 35.7 39.87      Physical Exam  Constitutional: She is oriented to person, place, and time. She appears well-developed and well-nourished.  Cardiovascular: Normal rate, normal heart sounds and intact distal pulses.   No murmur heard. Pulmonary/Chest: Effort normal and breath sounds normal. She has no wheezes. She has no rales. She exhibits no tenderness.  Abdominal: Soft. Bowel sounds are normal. She exhibits no distension and no mass. There is no tenderness.  Musculoskeletal: Normal  range of motion.  Neurological: She is alert and oriented to person, place, and time.      CMP Latest Ref Rng 03/07/2016 06/18/2015 06/12/2015  Glucose 65 - 99 mg/dL 113(H) 109(H) 114(H)  BUN 6 - 20 mg/dL 11 8 6   Creatinine 0.44 - 1.00 mg/dL 0.50 0.58 0.53  Sodium 135 - 145 mmol/L 139 137 138  Potassium 3.5 - 5.1 mmol/L 3.7 3.7 3.5  Chloride 101 - 111 mmol/L 102 102 107  CO2 22 - 32 mmol/L 30 27 23   Calcium 8.9 - 10.3 mg/dL 9.5 10.0 8.6(L)  Total Protein 6.5 - 8.1 g/dL - 7.3 -  Total Bilirubin 0.3 - 1.2 mg/dL - 0.8 -  Alkaline Phos 38 - 126 U/L - 81 -  AST 15 - 41 U/L - 43(H) -  ALT 14 - 54 U/L - 39 -    Lipid Panel     Component Value Date/Time   CHOL 213* 04/18/2015 1056   TRIG 73 04/18/2015 1056   HDL 77 04/18/2015 1056   CHOLHDL 2.8 04/18/2015 1056   VLDL 15 04/18/2015 1056   LDLCALC 121 04/18/2015 1056    CLINICAL DATA: Right knee pain and swelling for 1 week, no known injury, initial encounter  EXAM: RIGHT KNEE - COMPLETE 4+ VIEW  COMPARISON: None.  FINDINGS: No evidence of fracture, dislocation, or joint effusion. No evidence of arthropathy or other focal bone abnormality. Soft tissues are unremarkable.  IMPRESSION: No acute abnormality noted.   Electronically Signed  By: Inez Catalina M.D.  On: 03/07/2016 10:39        Assessment & Plan:   1. Essential hypertension Controlled - lisinopril-hydrochlorothiazide (ZESTORETIC) 20-12.5 MG tablet; Take 2 tablets by mouth daily.  Dispense: 60 tablet; Refill: 5 - Lipid panel - COMPLETE METABOLIC PANEL WITH GFR  2. Pedal edema Dependent versus secondary to amlodipine Have discontinued amlodipine Low-sodium diet Elevate legs  3. Right knee pain X-ray unrevealing Cannot exclude underlying early osteoarthritis Advised weight loss would beneficial - naproxen (NAPROSYN) 500 MG tablet; Take 1 tablet (500 mg total) by mouth 2 (two) times daily with a meal.  Dispense: 60 tablet; Refill: 1  4.  Hyperlipidemia Lipid panel today Continue atorvastatin   5. Healthcare maintenance Mammogram due in 04/2016 Up-to-date on Pap smear Refuses colonoscopy despite being educated about it.  Meds ordered this encounter  Medications  . lisinopril-hydrochlorothiazide (ZESTORETIC) 20-12.5 MG tablet    Sig: Take 2 tablets by mouth daily.    Dispense:  60 tablet    Refill:  5    Discontinue Amlodipine  . naproxen (NAPROSYN) 500 MG tablet    Sig: Take 1 tablet (500 mg total) by mouth 2 (two) times daily with a meal.    Dispense:  60 tablet    Refill:  1    Follow-up: Return in about 6 weeks (around 04/22/2016) for follow up on right knee pain.   Arnoldo Morale MD

## 2016-03-11 NOTE — Patient Instructions (Signed)
Hypertension Hypertension, commonly called high blood pressure, is when the force of blood pumping through your arteries is too strong. Your arteries are the blood vessels that carry blood from your heart throughout your body. A blood pressure reading consists of a higher number over a lower number, such as 110/72. The higher number (systolic) is the pressure inside your arteries when your heart pumps. The lower number (diastolic) is the pressure inside your arteries when your heart relaxes. Ideally you want your blood pressure below 120/80. Hypertension forces your heart to work harder to pump blood. Your arteries may become narrow or stiff. Having untreated or uncontrolled hypertension can cause heart attack, stroke, kidney disease, and other problems. RISK FACTORS Some risk factors for high blood pressure are controllable. Others are not.  Risk factors you cannot control include:   Race. You may be at higher risk if you are African American.  Age. Risk increases with age.  Gender. Men are at higher risk than women before age 45 years. After age 65, women are at higher risk than men. Risk factors you can control include:  Not getting enough exercise or physical activity.  Being overweight.  Getting too much fat, sugar, calories, or salt in your diet.  Drinking too much alcohol. SIGNS AND SYMPTOMS Hypertension does not usually cause signs or symptoms. Extremely high blood pressure (hypertensive crisis) may cause headache, anxiety, shortness of breath, and nosebleed. DIAGNOSIS To check if you have hypertension, your health care provider will measure your blood pressure while you are seated, with your arm held at the level of your heart. It should be measured at least twice using the same arm. Certain conditions can cause a difference in blood pressure between your right and left arms. A blood pressure reading that is higher than normal on one occasion does not mean that you need treatment. If  it is not clear whether you have high blood pressure, you may be asked to return on a different day to have your blood pressure checked again. Or, you may be asked to monitor your blood pressure at home for 1 or more weeks. TREATMENT Treating high blood pressure includes making lifestyle changes and possibly taking medicine. Living a healthy lifestyle can help lower high blood pressure. You may need to change some of your habits. Lifestyle changes may include:  Following the DASH diet. This diet is high in fruits, vegetables, and whole grains. It is low in salt, red meat, and added sugars.  Keep your sodium intake below 2,300 mg per day.  Getting at least 30-45 minutes of aerobic exercise at least 4 times per week.  Losing weight if necessary.  Not smoking.  Limiting alcoholic beverages.  Learning ways to reduce stress. Your health care provider may prescribe medicine if lifestyle changes are not enough to get your blood pressure under control, and if one of the following is true:  You are 18-59 years of age and your systolic blood pressure is above 140.  You are 60 years of age or older, and your systolic blood pressure is above 150.  Your diastolic blood pressure is above 90.  You have diabetes, and your systolic blood pressure is over 140 or your diastolic blood pressure is over 90.  You have kidney disease and your blood pressure is above 140/90.  You have heart disease and your blood pressure is above 140/90. Your personal target blood pressure may vary depending on your medical conditions, your age, and other factors. HOME CARE INSTRUCTIONS    Have your blood pressure rechecked as directed by your health care provider.   Take medicines only as directed by your health care provider. Follow the directions carefully. Blood pressure medicines must be taken as prescribed. The medicine does not work as well when you skip doses. Skipping doses also puts you at risk for  problems.  Do not smoke.   Monitor your blood pressure at home as directed by your health care provider. SEEK MEDICAL CARE IF:   You think you are having a reaction to medicines taken.  You have recurrent headaches or feel dizzy.  You have swelling in your ankles.  You have trouble with your vision. SEEK IMMEDIATE MEDICAL CARE IF:  You develop a severe headache or confusion.  You have unusual weakness, numbness, or feel faint.  You have severe chest or abdominal pain.  You vomit repeatedly.  You have trouble breathing. MAKE SURE YOU:   Understand these instructions.  Will watch your condition.  Will get help right away if you are not doing well or get worse.   This information is not intended to replace advice given to you by your health care provider. Make sure you discuss any questions you have with your health care provider.   Document Released: 08/24/2005 Document Revised: 01/08/2015 Document Reviewed: 06/16/2013 Elsevier Interactive Patient Education 2016 [employer]  

## 2016-03-11 NOTE — Progress Notes (Signed)
Follow up for hypertension and fasting blood work today to check potassium level and cholesterol Right knee soreness Bilateral ankle swelling

## 2016-03-12 ENCOUNTER — Telehealth: Payer: Self-pay

## 2016-03-12 NOTE — Telephone Encounter (Signed)
Writer called and notified patient that her labs had significantly improved, she should keep taking her medications and also continue on the low cholesterol diet.  Patient was very happy and stated understanding.

## 2016-03-12 NOTE — Telephone Encounter (Signed)
-----   Message from Arnoldo Morale, MD sent at 03/12/2016  8:21 AM EDT ----- Cholesterol has improved significantly and other labs are normal. She should be commended and keep up with her medications and low cholesterol diet.

## 2016-04-02 ENCOUNTER — Ambulatory Visit: Payer: Self-pay | Attending: Internal Medicine

## 2016-04-02 MED FILL — ATORVASTATIN 20 MG TABLET: 20 | 30 days supply | Qty: 30 | Fill #2

## 2016-04-06 ENCOUNTER — Other Ambulatory Visit: Payer: Self-pay | Admitting: Family Medicine

## 2016-04-06 DIAGNOSIS — Z1231 Encounter for screening mammogram for malignant neoplasm of breast: Secondary | ICD-10-CM

## 2016-04-14 ENCOUNTER — Ambulatory Visit: Payer: Self-pay

## 2016-04-15 MED FILL — NAPROXEN 500 MG TABLET: 500 | 30 days supply | Qty: 60 | Fill #1

## 2016-04-15 MED FILL — LISINOPRIL-HCTZ 20-12.5 MG: 20-12.5 | 30 days supply | Qty: 60 | Fill #1

## 2016-04-16 ENCOUNTER — Ambulatory Visit
Admission: RE | Admit: 2016-04-16 | Discharge: 2016-04-16 | Disposition: A | Discharge status: Home / Self Care | Payer: No Typology Code available for payment source | Source: Ambulatory Visit | Attending: Family Medicine | Admitting: Family Medicine

## 2016-04-16 DIAGNOSIS — Z1231 Encounter for screening mammogram for malignant neoplasm of breast: Secondary | ICD-10-CM

## 2016-04-24 ENCOUNTER — Telehealth: Payer: Self-pay

## 2016-04-24 NOTE — Telephone Encounter (Signed)
Writer LVM asking patient to return call regarding test results.

## 2016-04-24 NOTE — Telephone Encounter (Signed)
-----   Message from Arnoldo Morale, MD sent at 04/20/2016  2:05 PM EDT ----- Mammogram is negative for malignancy

## 2016-04-27 ENCOUNTER — Telehealth: Payer: Self-pay

## 2016-04-27 NOTE — Telephone Encounter (Signed)
Called patient and LVM requesting a call back to discuss test results.

## 2016-04-27 NOTE — Telephone Encounter (Signed)
-----   Message from Arnoldo Morale, MD sent at 04/20/2016  2:05 PM EDT ----- Mammogram is negative for malignancy

## 2016-05-04 ENCOUNTER — Telehealth: Payer: Self-pay

## 2016-05-04 NOTE — Telephone Encounter (Signed)
Requested callback to [facility];  Letter generated.

## 2016-05-13 ENCOUNTER — Telehealth: Payer: Self-pay | Admitting: Family Medicine

## 2016-05-20 ENCOUNTER — Emergency Department (HOSPITAL_COMMUNITY)
Admission: EM | Admit: 2016-05-20 | Discharge: 2016-05-20 | Disposition: A | Discharge status: Home / Self Care | Payer: No Typology Code available for payment source | Attending: Emergency Medicine | Admitting: Emergency Medicine

## 2016-05-20 ENCOUNTER — Encounter (HOSPITAL_COMMUNITY): Payer: Self-pay

## 2016-05-20 DIAGNOSIS — R197 Diarrhea, unspecified: Secondary | ICD-10-CM | POA: Insufficient documentation

## 2016-05-20 DIAGNOSIS — R112 Nausea with vomiting, unspecified: Secondary | ICD-10-CM | POA: Insufficient documentation

## 2016-05-20 DIAGNOSIS — R630 Anorexia: Secondary | ICD-10-CM | POA: Insufficient documentation

## 2016-05-20 DIAGNOSIS — Z5321 Procedure and treatment not carried out due to patient leaving prior to being seen by health care provider: Secondary | ICD-10-CM | POA: Insufficient documentation

## 2016-05-20 DIAGNOSIS — I10 Essential (primary) hypertension: Secondary | ICD-10-CM | POA: Insufficient documentation

## 2016-05-20 DIAGNOSIS — Z79899 Other long term (current) drug therapy: Secondary | ICD-10-CM | POA: Insufficient documentation

## 2016-05-20 LAB — COMPREHENSIVE METABOLIC PANEL
ALK PHOS: 69 U/L (ref 38–126)
ALT: 36 U/L (ref 14–54)
AST: 41 U/L (ref 15–41)
Albumin: 3.6 g/dL (ref 3.5–5.0)
Anion gap: 17 — ABNORMAL HIGH (ref 5–15)
BILIRUBIN TOTAL: 0.7 mg/dL (ref 0.3–1.2)
BUN: 11 mg/dL (ref 6–20)
CALCIUM: 7.9 mg/dL — AB (ref 8.9–10.3)
CO2: 24 mmol/L (ref 22–32)
CREATININE: 0.57 mg/dL (ref 0.44–1.00)
Chloride: 102 mmol/L (ref 101–111)
GFR calc Af Amer: >60 mL/min (ref >60)
Glucose, Bld: 71 mg/dL (ref 65–99)
POTASSIUM: 2.7 mmol/L — AB (ref 3.5–5.1)
Sodium: 143 mmol/L (ref 135–145)
TOTAL PROTEIN: 7.3 g/dL (ref 6.5–8.1)

## 2016-05-20 LAB — CBC
HEMATOCRIT: 38 % (ref 36.0–46.0)
Hemoglobin: 12.2 g/dL (ref 12.0–15.0)
MCH: 27.6 pg (ref 26.0–34.0)
MCHC: 32.1 g/dL (ref 30.0–36.0)
MCV: 86 fL (ref 78.0–100.0)
PLATELETS: 181 10*3/uL (ref 150–400)
RBC: 4.42 MIL/uL (ref 3.87–5.11)
RDW: 18.3 % — AB (ref 11.5–15.5)
WBC: 4.2 10*3/uL (ref 4.0–10.5)

## 2016-05-20 LAB — LIPASE, BLOOD: Lipase: 16 U/L (ref 11–51)

## 2016-05-20 NOTE — ED Notes (Signed)
RN notified of abnormal lab 

## 2016-05-20 NOTE — ED Triage Notes (Signed)
Called pt twice no response. 

## 2016-05-20 NOTE — ED Notes (Signed)
Called twice no response. 

## 2016-05-20 NOTE — ED Triage Notes (Signed)
She c/o 4-day hx of occasional n/v/d with anorexia "I feel dehydrated".  She is in no distress.

## 2016-06-04 ENCOUNTER — Other Ambulatory Visit: Payer: Self-pay | Admitting: Family Medicine

## 2016-06-04 ENCOUNTER — Other Ambulatory Visit: Payer: Self-pay | Admitting: Internal Medicine

## 2016-06-04 DIAGNOSIS — M25561 Pain in right knee: Secondary | ICD-10-CM

## 2016-06-04 MED FILL — LISINOPRIL-HCTZ 20-12.5 MG: 20-12.5 | 30 days supply | Qty: 60 | Fill #2

## 2016-06-05 MED FILL — NAPROXEN 500 MG TABLET: 500 | 30 days supply | Qty: 60 | Fill #0

## 2016-06-10 MED FILL — ATORVASTATIN 20 MG TABLET: 20 | 30 days supply | Qty: 30 | Fill #3

## 2016-07-08 MED FILL — ?ATORVASTATIN 20 MG TABLET: 20 | 30 days supply | Qty: 30 | Fill #4

## 2016-07-08 MED FILL — LISINOPRIL-HCTZ 20-12.5 MG: 20-12.5 | 30 days supply | Qty: 60 | Fill #3

## 2016-08-25 ENCOUNTER — Other Ambulatory Visit: Payer: Self-pay | Admitting: Family Medicine

## 2016-08-25 DIAGNOSIS — M25561 Pain in right knee: Secondary | ICD-10-CM

## 2016-08-25 MED FILL — ?ATORVASTATIN 20 MG TABLET: 20 | 30 days supply | Qty: 30 | Fill #5

## 2016-08-25 MED FILL — NAPROXEN 500 MG TABLET: 500 | 30 days supply | Qty: 60 | Fill #0

## 2016-08-25 MED FILL — LISINOPRIL-HCTZ 20-12.5 MG: 20-12.5 | 30 days supply | Qty: 60 | Fill #4

## 2016-09-21 ENCOUNTER — Encounter: Payer: Self-pay | Admitting: Family Medicine

## 2016-09-21 ENCOUNTER — Ambulatory Visit: Payer: Self-pay | Attending: Family Medicine | Admitting: Family Medicine

## 2016-09-21 VITALS — BP 140/85 | HR 64 | Temp 98.3°F | Ht 67.0 in | Wt 267.6 lb

## 2016-09-21 DIAGNOSIS — R0981 Nasal congestion: Secondary | ICD-10-CM | POA: Insufficient documentation

## 2016-09-21 DIAGNOSIS — Z79899 Other long term (current) drug therapy: Secondary | ICD-10-CM | POA: Insufficient documentation

## 2016-09-21 DIAGNOSIS — J310 Chronic rhinitis: Secondary | ICD-10-CM | POA: Insufficient documentation

## 2016-09-21 DIAGNOSIS — M25561 Pain in right knee: Secondary | ICD-10-CM | POA: Insufficient documentation

## 2016-09-21 DIAGNOSIS — E876 Hypokalemia: Secondary | ICD-10-CM | POA: Insufficient documentation

## 2016-09-21 DIAGNOSIS — E7800 Pure hypercholesterolemia, unspecified: Secondary | ICD-10-CM | POA: Insufficient documentation

## 2016-09-21 DIAGNOSIS — Z23 Encounter for immunization: Secondary | ICD-10-CM

## 2016-09-21 DIAGNOSIS — G8929 Other chronic pain: Secondary | ICD-10-CM | POA: Insufficient documentation

## 2016-09-21 DIAGNOSIS — Z880 Allergy status to penicillin: Secondary | ICD-10-CM | POA: Insufficient documentation

## 2016-09-21 DIAGNOSIS — M545 Low back pain: Secondary | ICD-10-CM | POA: Insufficient documentation

## 2016-09-21 DIAGNOSIS — I10 Essential (primary) hypertension: Secondary | ICD-10-CM | POA: Insufficient documentation

## 2016-09-21 LAB — COMPLETE METABOLIC PANEL WITH GFR
ALT: 12 U/L (ref 6–29)
AST: 15 U/L (ref 10–35)
Albumin: 4.2 g/dL (ref 3.6–5.1)
Alkaline Phosphatase: 60 U/L (ref 33–130)
BUN: 14 mg/dL (ref 7–25)
CHLORIDE: 98 mmol/L (ref 98–110)
CO2: 33 mmol/L — AB (ref 20–31)
Calcium: 10 mg/dL (ref 8.6–10.4)
Creat: 0.54 mg/dL (ref 0.50–1.05)
GLUCOSE: 102 mg/dL — AB (ref 65–99)
POTASSIUM: 3.9 mmol/L (ref 3.5–5.3)
SODIUM: 140 mmol/L (ref 135–146)
Total Bilirubin: 0.6 mg/dL (ref 0.2–1.2)
Total Protein: 7.4 g/dL (ref 6.1–8.1)

## 2016-09-21 MED ORDER — LISINOPRIL-HYDROCHLOROTHIAZIDE 20-12.5 MG PO TABS: 2.0000 | ORAL_TABLET | Freq: Every day | ORAL | 5 refills | Status: DC

## 2016-09-21 MED ORDER — FLUTICASONE PROPIONATE 50 MCG/ACT NA SUSP: 2.0000 | Freq: Every day | NASAL | 6 refills | Status: DC

## 2016-09-21 MED ORDER — TRAMADOL HCL 50 MG PO TABS: 50.0000 mg | ORAL_TABLET | Freq: Two times a day (BID) | ORAL | 1 refills | Status: DC | PRN

## 2016-09-21 MED ORDER — NAPROXEN 500 MG PO TABS: ORAL_TABLET | ORAL | 5 refills | Status: DC

## 2016-09-21 MED ORDER — LORATADINE 10 MG PO TABS: 10.0000 mg | ORAL_TABLET | Freq: Every day | ORAL | 5 refills | Status: DC

## 2016-09-21 MED ORDER — ATORVASTATIN CALCIUM 20 MG PO TABS: 20.0000 mg | ORAL_TABLET | Freq: Every day | ORAL | 5 refills | Status: DC

## 2016-09-21 MED FILL — NAPROXEN 500 MG TABLET: 500 | 30 days supply | Qty: 60 | Fill #0

## 2016-09-21 MED FILL — FLUTICASONE PROP 50 MCG SPR: 50 | 30 days supply | Qty: 16 | Fill #0

## 2016-09-21 MED FILL — LISINOPRIL-HCTZ 20-12.5 MG: 20-12.5 | 30 days supply | Qty: 60 | Fill #0

## 2016-09-21 MED FILL — ATORVASTATIN 20 MG TABLET: 20 | 30 days supply | Qty: 30 | Fill #0

## 2016-09-21 NOTE — Progress Notes (Signed)
Subjective:  Patient ID: Emily Dickson, female    DOB: 05/01/1956  Age: 61 y.o. MRN: [id number]  CC: Hypertension and Joint Swelling (right)   HPI Emily Dickson is a 61 year old female with a history of hypertension, hyperlipidemia, chronic rhinitis right knee pain who presents today for follow-up visit.  She has been compliant with her antihypertensives and statin and tolerates them well. She uses Flonase and antihistamine for chronic rhinitis but does have some nasal congestion.  Complains of right knee pain which responsible naproxen. Pain is more in the medial aspect of her knee and worse with going up and down the stairs. She also thinks she has some mild edema.  Review of her last set of labs indicate hypokalemia and she is currently not taking any potassium supplements. She would like to receive the flu shot today.  Past Medical History:  Diagnosis Date  . Back pain   . High cholesterol   . Hypertension     History reviewed. No pertinent surgical history.  Allergies  Allergen Reactions  . Penicillins Rash    Has patient had a PCN reaction causing immediate rash, facial/tongue/throat swelling, SOB or lightheadedness with hypotension: No Has patient had a PCN reaction causing severe rash involving mucus membranes or skin necrosis: No Has patient had a PCN reaction that required hospitalization No Has patient had a PCN reaction occurring within the last 10 years: No If all of the above answers are "NO", then may proceed with Cephalosporin use.     Outpatient Medications Prior to Visit  Medication Sig Dispense Refill  . Multiple Vitamin (MULTIVITAMIN WITH MINERALS) TABS tablet Take 1 tablet by mouth daily.    Marland Kitchen atorvastatin (LIPITOR) 20 MG tablet Take 1 tablet (20 mg total) by mouth daily at 6 PM. 30 tablet 5  . fluticasone (FLONASE) 50 MCG/ACT nasal spray Place 2 sprays into both nostrils daily. 16 g 6  . lisinopril-hydrochlorothiazide (ZESTORETIC) 20-12.5 MG  tablet Take 2 tablets by mouth daily. 60 tablet 5  . loratadine (CLARITIN) 10 MG tablet Take 1 tablet (10 mg total) by mouth daily. 30 tablet 3  . naproxen (NAPROSYN) 500 MG tablet TAKE 1 TABLET BY MOUTH 2 TIMES DAILY WITH A MEAL. 60 tablet 0  . traMADol (ULTRAM) 50 MG tablet Take 1 tablet (50 mg total) by mouth every 12 (twelve) hours as needed. 60 tablet 0  . potassium chloride SA (K-DUR,KLOR-CON) 20 MEQ tablet Take 1 tablet (20 mEq total) by mouth daily. (Patient not taking: Reported on 09/21/2016) 30 tablet 0   No facility-administered medications prior to visit.     ROS Review of Systems  Constitutional: Negative for activity change, appetite change and fatigue.  HENT: Positive for congestion. Negative for sinus pressure and sore throat.   Eyes: Negative for visual disturbance.  Respiratory: Negative for cough, chest tightness, shortness of breath and wheezing.   Cardiovascular: Negative for chest pain and palpitations.  Gastrointestinal: Negative for abdominal distention, abdominal pain and constipation.  Endocrine: Negative for polydipsia.  Genitourinary: Negative for dysuria and frequency.  Musculoskeletal:       Right knee pain  Skin: Negative for rash.  Neurological: Negative for tremors, light-headedness and numbness.  Hematological: Does not bruise/bleed easily.  Psychiatric/Behavioral: Negative for agitation and behavioral problems.    Objective:  BP 140/85 (BP Location: Right Arm, Patient Position: Sitting, Cuff Size: Large)   Pulse 64   Temp 98.3 F (36.8 C) (Oral)   Ht 5\' 7"  (1.702 m)  Wt 267 lb 9.6 oz (121.4 kg)   SpO2 100%   BMI 41.91 kg/m   BP/Weight 09/21/2016 123456 [id number]  Systolic BP [id number]  Diastolic BP 85 70 82  Wt. (Lbs) 267.6 - 260  BMI 41.91 - 40.71    Physical Exam  Constitutional: She is oriented to person, place, and time. She appears well-developed and well-nourished.  Cardiovascular: Normal rate, normal heart sounds and intact  distal pulses.   No murmur heard. Pulmonary/Chest: Effort normal and breath sounds normal. She has no wheezes. She has no rales. She exhibits no tenderness.  Abdominal: Soft. Bowel sounds are normal. She exhibits no distension and no mass. There is no tenderness.  Musculoskeletal: Normal range of motion. She exhibits edema (mild edema) and tenderness (mild tenderness on ROM).  Neurological: She is alert and oriented to person, place, and time.  Skin: Skin is warm and dry.  Psychiatric: She has a normal mood and affect.     CMP Latest Ref Rng & Units 05/20/2016 03/11/2016 03/07/2016  Glucose 65 - 99 mg/dL 71 101(H) 113(H)  BUN 6 - 20 mg/dL 11 12 11   Creatinine 0.44 - 1.00 mg/dL 0.57 0.53 0.50  Sodium 135 - 145 mmol/L 143 139 139  Potassium 3.5 - 5.1 mmol/L 2.7(LL) 4.0 3.7  Chloride 101 - 111 mmol/L 102 102 102  CO2 22 - 32 mmol/L 24 31 30   Calcium 8.9 - 10.3 mg/dL 7.9(L) 9.4 9.5  Total Protein 6.5 - 8.1 g/dL 7.3 7.4 -  Total Bilirubin 0.3 - 1.2 mg/dL 0.7 0.6 -  Alkaline Phos 38 - 126 U/L 69 66 -  AST 15 - 41 U/L 41 12 -  ALT 14 - 54 U/L 36 13 -    Lipid Panel     Component Value Date/Time   CHOL 151 03/11/2016 1043   TRIG 63 03/11/2016 1043   HDL 58 03/11/2016 1043   CHOLHDL 2.6 03/11/2016 1043   VLDL 13 03/11/2016 1043   LDLCALC 80 03/11/2016 1043    Assessment & Plan:   1. Essential hypertension Controlled - lisinopril-hydrochlorothiazide (ZESTORETIC) 20-12.5 MG tablet; Take 2 tablets by mouth daily.  Dispense: 60 tablet; Refill: 5 - COMPLETE METABOLIC PANEL WITH GFR  2. Pure hypercholesterolemia Controlled - atorvastatin (LIPITOR) 20 MG tablet; Take 1 tablet (20 mg total) by mouth daily at 6 PM.  Dispense: 30 tablet; Refill: 5  3. Chronic pain of right knee Advised to use a knee brace We have discussed intra-articular cortisone injection which she is not on board with. - naproxen (NAPROSYN) 500 MG tablet; TAKE 1 TABLET BY MOUTH 2 TIMES DAILY WITH A MEAL.  Dispense: 60  tablet; Refill: 5  4. Other chronic rhinitis - loratadine (CLARITIN) 10 MG tablet; Take 1 tablet (10 mg total) by mouth daily.  Dispense: 30 tablet; Refill: 5 - fluticasone (FLONASE) 50 MCG/ACT nasal spray; Place 2 sprays into both nostrils daily.  Dispense: 16 g; Refill: 6  5. Hypokalemia She completed a course of potassium 3 months ago We'll send off labs and replace potassium if depleted.   Meds ordered this encounter  Medications  . traMADol (ULTRAM) 50 MG tablet    Sig: Take 1 tablet (50 mg total) by mouth every 12 (twelve) hours as needed.    Dispense:  60 tablet    Refill:  1  . loratadine (CLARITIN) 10 MG tablet    Sig: Take 1 tablet (10 mg total) by mouth daily.    Dispense:  30 tablet  Refill:  5  . fluticasone (FLONASE) 50 MCG/ACT nasal spray    Sig: Place 2 sprays into both nostrils daily.    Dispense:  16 g    Refill:  6  . lisinopril-hydrochlorothiazide (ZESTORETIC) 20-12.5 MG tablet    Sig: Take 2 tablets by mouth daily.    Dispense:  60 tablet    Refill:  5  . atorvastatin (LIPITOR) 20 MG tablet    Sig: Take 1 tablet (20 mg total) by mouth daily at 6 PM.    Dispense:  30 tablet    Refill:  5  . naproxen (NAPROSYN) 500 MG tablet    Sig: TAKE 1 TABLET BY MOUTH 2 TIMES DAILY WITH A MEAL.    Dispense:  60 tablet    Refill:  5    Must have office visit for refills    Follow-up: Return in about 3 months (around 12/20/2016) for Follow-up on hypertension and knee pain.   Arnoldo Morale MD

## 2016-09-28 ENCOUNTER — Ambulatory Visit: Payer: Self-pay | Attending: Family Medicine

## 2016-10-06 ENCOUNTER — Telehealth: Payer: Self-pay

## 2016-10-06 NOTE — Telephone Encounter (Signed)
-----   Message from Arnoldo Morale, MD sent at 09/22/2016  1:24 PM EST ----- Please inform the patient that labs are normal. Thank you.

## 2016-10-06 NOTE — Telephone Encounter (Signed)
Writer called patient and discussed blood test results per Dr.Amao.  Patient stated understanding.

## 2016-10-26 MED FILL — LISINOPRIL-HCTZ 20-12.5 MG: 20-12.5 | 30 days supply | Qty: 60 | Fill #1

## 2016-10-26 MED FILL — ATORVASTATIN 20 MG TABLET: 20 | 30 days supply | Qty: 30 | Fill #1

## 2016-11-23 MED FILL — LISINOPRIL-HCTZ 20-12.5 MG: 20-12.5 | 30 days supply | Qty: 60 | Fill #2

## 2016-11-24 MED FILL — ATORVASTATIN 20 MG TABLET: 20 | 30 days supply | Qty: 30 | Fill #2

## 2016-12-24 MED FILL — ATORVASTATIN 20 MG TABLET: 20 | 30 days supply | Qty: 30 | Fill #3

## 2016-12-24 MED FILL — LISINOPRIL-HCTZ 20-12.5 MG: 20-12.5 | 30 days supply | Qty: 60 | Fill #3

## 2016-12-24 MED FILL — NAPROXEN 500 MG TABLET: 500 | 30 days supply | Qty: 60 | Fill #1

## 2017-01-11 ENCOUNTER — Encounter: Payer: Self-pay | Admitting: Family Medicine

## 2017-01-11 ENCOUNTER — Ambulatory Visit: Payer: Self-pay | Attending: Family Medicine | Admitting: Family Medicine

## 2017-01-11 VITALS — BP 131/81 | HR 68 | Temp 98.6°F | Resp 18 | Ht 67.0 in | Wt 280.0 lb

## 2017-01-11 DIAGNOSIS — J3089 Other allergic rhinitis: Secondary | ICD-10-CM

## 2017-01-11 DIAGNOSIS — M1711 Unilateral primary osteoarthritis, right knee: Secondary | ICD-10-CM | POA: Insufficient documentation

## 2017-01-11 DIAGNOSIS — J302 Other seasonal allergic rhinitis: Secondary | ICD-10-CM | POA: Insufficient documentation

## 2017-01-11 DIAGNOSIS — I10 Essential (primary) hypertension: Secondary | ICD-10-CM

## 2017-01-11 DIAGNOSIS — M25561 Pain in right knee: Secondary | ICD-10-CM

## 2017-01-11 DIAGNOSIS — G8929 Other chronic pain: Secondary | ICD-10-CM

## 2017-01-11 DIAGNOSIS — Z79899 Other long term (current) drug therapy: Secondary | ICD-10-CM | POA: Insufficient documentation

## 2017-01-11 DIAGNOSIS — Z6841 Body Mass Index (BMI) 40.0 and over, adult: Secondary | ICD-10-CM

## 2017-01-11 DIAGNOSIS — E7800 Pure hypercholesterolemia, unspecified: Secondary | ICD-10-CM

## 2017-01-11 MED ORDER — FLUTICASONE PROPIONATE 50 MCG/ACT NA SUSP: 2.0000 | Freq: Every day | NASAL | 6 refills | Status: DC

## 2017-01-11 MED ORDER — LISINOPRIL-HYDROCHLOROTHIAZIDE 20-12.5 MG PO TABS: 2.0000 | ORAL_TABLET | Freq: Every day | ORAL | 5 refills | Status: DC

## 2017-01-11 MED ORDER — ATORVASTATIN CALCIUM 20 MG PO TABS: 20.0000 mg | ORAL_TABLET | Freq: Every day | ORAL | 5 refills | Status: DC

## 2017-01-11 MED ORDER — LORATADINE 10 MG PO TABS: 10.0000 mg | ORAL_TABLET | Freq: Every day | ORAL | 5 refills | Status: DC

## 2017-01-11 MED ORDER — NAPROXEN 500 MG PO TABS: ORAL_TABLET | ORAL | 5 refills | Status: DC

## 2017-01-11 MED FILL — FLUTICASONE PROP 50 MCG SPR: 50 | 30 days supply | Qty: 16 | Fill #0

## 2017-01-11 NOTE — Patient Instructions (Signed)

## 2017-01-11 NOTE — Progress Notes (Signed)
Patient is here for HTN  Patient denies pain at this time.  Patient has taken medication today. Patient has not eaten today.

## 2017-01-11 NOTE — Progress Notes (Signed)
Subjective:  Patient ID: Emily Dickson, female    DOB: 01-17-1956  Age: 61 y.o. MRN: [id number]  CC: Hypertension   HPI Emily Dickson is a 61 year old female with a history of hypertension, hyperlipidemia, chronic rhinitis right knee pain who presents today for follow-up visit.  She has been compliant with her antihypertensives and statin and tolerates them well. She uses Flonase and antihistamine for chronic rhinitis but does have some nasal congestion.  She has right knee osteoarthritis which has improved significantly compared to last visit. She uses naproxen and has a knee brace which helps her . Denies any swelling and rarely has to use tramadol.   Past Medical History:  Diagnosis Date  . Back pain   . High cholesterol   . Hypertension     No past surgical history on file.   Outpatient Medications Prior to Visit  Medication Sig Dispense Refill  . Biotin 1000 MCG tablet Take 1,000 mcg by mouth 3 (three) times daily.    . Multiple Vitamin (MULTIVITAMIN WITH MINERALS) TABS tablet Take 1 tablet by mouth daily.    . traMADol (ULTRAM) 50 MG tablet Take 1 tablet (50 mg total) by mouth every 12 (twelve) hours as needed. 60 tablet 1  . atorvastatin (LIPITOR) 20 MG tablet Take 1 tablet (20 mg total) by mouth daily at 6 PM. 30 tablet 5  . fluticasone (FLONASE) 50 MCG/ACT nasal spray Place 2 sprays into both nostrils daily. 16 g 6  . lisinopril-hydrochlorothiazide (ZESTORETIC) 20-12.5 MG tablet Take 2 tablets by mouth daily. 60 tablet 5  . loratadine (CLARITIN) 10 MG tablet Take 1 tablet (10 mg total) by mouth daily. 30 tablet 5  . naproxen (NAPROSYN) 500 MG tablet TAKE 1 TABLET BY MOUTH 2 TIMES DAILY WITH A MEAL. 60 tablet 5  . potassium chloride SA (K-DUR,KLOR-CON) 20 MEQ tablet Take 1 tablet (20 mEq total) by mouth daily. (Patient not taking: Reported on 09/21/2016) 30 tablet 0   No facility-administered medications prior to visit.     ROS Review of Systems    Constitutional: Negative for activity change, appetite change and fatigue.  HENT: Negative for congestion, sinus pressure and sore throat.   Eyes: Negative for visual disturbance.  Respiratory: Negative for cough, chest tightness, shortness of breath and wheezing.   Cardiovascular: Negative for chest pain and palpitations.  Gastrointestinal: Negative for abdominal distention, abdominal pain and constipation.  Endocrine: Negative for polydipsia.  Genitourinary: Negative for dysuria and frequency.  Musculoskeletal: Negative for arthralgias and back pain.  Skin: Negative for rash.  Neurological: Negative for tremors, light-headedness and numbness.  Hematological: Does not bruise/bleed easily.  Psychiatric/Behavioral: Negative for agitation and behavioral problems.    Objective:  BP 131/81 (BP Location: Left Arm, Patient Position: Sitting, Cuff Size: Large)   Pulse 68   Temp 98.6 F (37 C) (Oral)   Resp 18   Ht 5' 7" (1.702 m)   Wt 280 lb (127 kg)   SpO2 99%   BMI 43.85 kg/m   BP/Weight 01/11/2017 09/21/2016 03/06/5283  Systolic BP 132 440 102  Diastolic BP 81 85 70  Wt. (Lbs) 280 267.6 -  BMI 43.85 41.91 -      Physical Exam  Constitutional: She is oriented to person, place, and time. She appears well-developed and well-nourished.  Cardiovascular: Normal rate, normal heart sounds and intact distal pulses.   No murmur heard. Pulmonary/Chest: Effort normal and breath sounds normal. She has no wheezes. She has no rales. She exhibits  no tenderness.  Abdominal: Soft. Bowel sounds are normal. She exhibits no distension and no mass. There is no tenderness.  Musculoskeletal: Normal range of motion.  Neurological: She is alert and oriented to person, place, and time.  Skin: Skin is warm and dry.  Psychiatric: She has a normal mood and affect.    CMP Latest Ref Rng & Units 09/21/2016 05/20/2016 03/11/2016  Glucose 65 - 99 mg/dL 102(H) 71 101(H)  BUN 7 - 25 mg/dL _0 Creatinine  0.50 - 1.05 mg/dL 0.54 0.57 0.53  Sodium 135 - 146 mmol/L 140 143 139  Potassium 3.5 - 5.3 mmol/L 3.9 2.7(LL) 4.0  Chloride 98 - 110 mmol/L 98 102 102  CO2 20 - 31 mmol/L 33(H) 24 31  Calcium 8.6 - 10.4 mg/dL 10.0 7.9(L) 9.4  Total Protein 6.1 - 8.1 g/dL 7.4 7.3 7.4  Total Bilirubin 0.2 - 1.2 mg/dL 0.6 0.7 0.6  Alkaline Phos 33 - 130 U/L 60 69 66  AST 10 - 35 U/L 15 41 12  ALT 6 - 29 U/L 12 36 13    Lipid Panel     Component Value Date/Time   CHOL 151 03/11/2016 1043   TRIG 63 03/11/2016 1043   HDL 58 03/11/2016 1043   CHOLHDL 2.6 03/11/2016 1043   VLDL 13 03/11/2016 1043   LDLCALC 80 03/11/2016 1043     Assessment & Plan:   1. Pure hypercholesterolemia Controlled - atorvastatin (LIPITOR) 20 MG tablet; Take 1 tablet (20 mg total) by mouth daily at 6 PM.  Dispense: 30 tablet; Refill: 5  2. Essential hypertension Controlled - lisinopril-hydrochlorothiazide (ZESTORETIC) 20-12.5 MG tablet; Take 2 tablets by mouth daily.  Dispense: 60 tablet; Refill: 5 - CMP14+EGFR - Lipid panel  3. Chronic pain of right knee Improved - naproxen (NAPROSYN) 500 MG tablet; TAKE 1 TABLET BY MOUTH 2 TIMES DAILY WITH A MEAL.  Dispense: 60 tablet; Refill: 5  4. Seasonal allergic rhinitis due to other allergic trigger Stable - fluticasone (FLONASE) 50 MCG/ACT nasal spray; Place 2 sprays into both nostrils daily.  Dispense: 16 g; Refill: 6 - loratadine (CLARITIN) 10 MG tablet; Take 1 tablet (10 mg total) by mouth daily.  Dispense: 30 tablet; Refill: 5  5. Class 3 severe obesity due to excess calories without serious comorbidity with body mass index (BMI) of 40.0 to 44.9 in adult [facility]) Discussed weight loss, reducing portion sizes   Meds ordered this encounter  Medications  . atorvastatin (LIPITOR) 20 MG tablet    Sig: Take 1 tablet (20 mg total) by mouth daily at 6 PM.    Dispense:  30 tablet    Refill:  5  . fluticasone (FLONASE) 50 MCG/ACT nasal spray    Sig: Place 2 sprays into both  nostrils daily.    Dispense:  16 g    Refill:  6  . lisinopril-hydrochlorothiazide (ZESTORETIC) 20-12.5 MG tablet    Sig: Take 2 tablets by mouth daily.    Dispense:  60 tablet    Refill:  5  . loratadine (CLARITIN) 10 MG tablet    Sig: Take 1 tablet (10 mg total) by mouth daily.    Dispense:  30 tablet    Refill:  5  . naproxen (NAPROSYN) 500 MG tablet    Sig: TAKE 1 TABLET BY MOUTH 2 TIMES DAILY WITH A MEAL.    Dispense:  60 tablet    Refill:  5    Must have office visit for refills  Follow-up: Return in about 3 months (around 04/13/2017) for Follow-up on chronic medical conditions.   Enobong Amao MD   

## 2017-01-12 ENCOUNTER — Other Ambulatory Visit: Payer: Self-pay | Admitting: Family Medicine

## 2017-01-12 LAB — CMP14+EGFR
A/G RATIO: 1.3 (ref 1.2–2.2)
ALT: 28 IU/L (ref 0–32)
AST: 23 IU/L (ref 0–40)
Albumin: 3.9 g/dL (ref 3.5–5.5)
Alkaline Phosphatase: 64 IU/L (ref 39–117)
BUN/Creatinine Ratio: 23 (ref 9–23)
BUN: 13 mg/dL (ref 6–24)
Bilirubin Total: 0.6 mg/dL (ref 0.0–1.2)
CHLORIDE: 97 mmol/L (ref 96–106)
CO2: 30 mmol/L — ABNORMAL HIGH (ref 18–29)
Calcium: 9.4 mg/dL (ref 8.7–10.2)
Creatinine, Ser: 0.57 mg/dL (ref 0.57–1.00)
GFR calc non Af Amer: 102 mL/min/{1.73_m2} (ref >59)
GFR, EST AFRICAN AMERICAN: 117 mL/min/{1.73_m2} (ref >59)
GLUCOSE: 121 mg/dL — AB (ref 65–99)
Globulin, Total: 3.1 g/dL (ref 1.5–4.5)
POTASSIUM: 3.3 mmol/L — AB (ref 3.5–5.2)
Sodium: 143 mmol/L (ref 134–144)
TOTAL PROTEIN: 7 g/dL (ref 6.0–8.5)

## 2017-01-12 LAB — LIPID PANEL
Chol/HDL Ratio: 2.5 ratio (ref 0.0–4.4)
Cholesterol, Total: 192 mg/dL (ref 100–199)
HDL: 76 mg/dL (ref >39)
LDL Calculated: 99 mg/dL (ref 0–99)
Triglycerides: 84 mg/dL (ref 0–149)
VLDL Cholesterol Cal: 17 mg/dL (ref 5–40)

## 2017-01-12 MED ORDER — POTASSIUM CHLORIDE ER 10 MEQ PO TBCR: 10.0000 meq | EXTENDED_RELEASE_TABLET | Freq: Every day | ORAL | 3 refills | Status: DC

## 2017-01-12 MED FILL — POTASSIUM CL 10 MEQ TAB SA: 10 | 30 days supply | Qty: 30 | Fill #0

## 2017-01-14 ENCOUNTER — Telehealth: Payer: Self-pay

## 2017-01-14 NOTE — Telephone Encounter (Signed)
-----   Message from Arnoldo Morale, MD sent at 01/12/2017  1:39 PM EDT ----- Potassium is low; I have sent a prescription for potassium to her pharmacy. Glucose is a bit high; please advise weight loss, low carbs and we will screen for diabetes at her next visit.

## 2017-01-14 NOTE — Telephone Encounter (Signed)
Pt returned call  - advsd of lab results; provider notations. Pt stated she understood and would p/u Rx on Friday.

## 2017-01-14 NOTE — Telephone Encounter (Signed)
Clld pt - LMOVMTC re lab results. 

## 2017-02-04 MED FILL — ATORVASTATIN 20 MG TABLET: 20 | 30 days supply | Qty: 30 | Fill #4

## 2017-02-04 MED FILL — POTASSIUM CL 10 MEQ TAB SA: 10 | 30 days supply | Qty: 30 | Fill #1

## 2017-02-04 MED FILL — LISINOPRIL-HCTZ 20-12.5 MG: 20-12.5 | 30 days supply | Qty: 60 | Fill #4

## 2017-03-15 MED FILL — POTASSIUM CL 10 MEQ TAB SA: 10 | 30 days supply | Qty: 30 | Fill #2

## 2017-03-15 MED FILL — ?ATORVASTATIN 20 MG TABLET: 20 | 30 days supply | Qty: 30 | Fill #5

## 2017-03-15 MED FILL — LISINOPRIL-HCTZ 20-12.5 MG: 20-12.5 | 30 days supply | Qty: 60 | Fill #5

## 2017-03-15 MED FILL — NAPROXEN 500 MG TABLET: 500 | 30 days supply | Qty: 60 | Fill #2 | Status: TO

## 2017-04-05 ENCOUNTER — Other Ambulatory Visit: Payer: Self-pay | Admitting: Family Medicine

## 2017-04-05 DIAGNOSIS — Z1231 Encounter for screening mammogram for malignant neoplasm of breast: Secondary | ICD-10-CM

## 2017-04-12 MED FILL — POTASSIUM CL 10 MEQ TAB SA: 10 | 30 days supply | Qty: 30 | Fill #3

## 2017-04-12 MED FILL — ATORVASTATIN 20 MG TABLET: 20 | 30 days supply | Qty: 30 | Fill #0

## 2017-04-12 MED FILL — LISINOPRIL-HCTZ 20-12.5 MG: 20-12.5 | 30 days supply | Qty: 60 | Fill #0

## 2017-04-13 ENCOUNTER — Ambulatory Visit: Payer: Self-pay | Attending: Family Medicine | Admitting: Family Medicine

## 2017-04-13 VITALS — BP 180/112 | HR 67 | Resp 16 | Ht 66.0 in | Wt 281.4 lb

## 2017-04-13 DIAGNOSIS — J302 Other seasonal allergic rhinitis: Secondary | ICD-10-CM | POA: Insufficient documentation

## 2017-04-13 DIAGNOSIS — E876 Hypokalemia: Secondary | ICD-10-CM | POA: Insufficient documentation

## 2017-04-13 DIAGNOSIS — E7800 Pure hypercholesterolemia, unspecified: Secondary | ICD-10-CM | POA: Insufficient documentation

## 2017-04-13 DIAGNOSIS — M1711 Unilateral primary osteoarthritis, right knee: Secondary | ICD-10-CM | POA: Insufficient documentation

## 2017-04-13 DIAGNOSIS — G8929 Other chronic pain: Secondary | ICD-10-CM | POA: Insufficient documentation

## 2017-04-13 DIAGNOSIS — J3089 Other allergic rhinitis: Secondary | ICD-10-CM

## 2017-04-13 DIAGNOSIS — M25561 Pain in right knee: Secondary | ICD-10-CM | POA: Insufficient documentation

## 2017-04-13 DIAGNOSIS — Z880 Allergy status to penicillin: Secondary | ICD-10-CM | POA: Insufficient documentation

## 2017-04-13 DIAGNOSIS — I10 Essential (primary) hypertension: Secondary | ICD-10-CM | POA: Insufficient documentation

## 2017-04-13 MED ORDER — POTASSIUM CHLORIDE ER 10 MEQ PO TBCR: 10.0000 meq | EXTENDED_RELEASE_TABLET | Freq: Every day | ORAL | 3 refills | Status: DC

## 2017-04-13 MED ORDER — LISINOPRIL-HYDROCHLOROTHIAZIDE 20-12.5 MG PO TABS: 2.0000 | ORAL_TABLET | Freq: Every day | ORAL | 5 refills | Status: DC

## 2017-04-13 MED ORDER — ATORVASTATIN CALCIUM 20 MG PO TABS: 20.0000 mg | ORAL_TABLET | Freq: Every day | ORAL | 5 refills | Status: DC

## 2017-04-13 MED ORDER — TRAMADOL HCL 50 MG PO TABS: 50.0000 mg | ORAL_TABLET | Freq: Two times a day (BID) | ORAL | 1 refills | Status: DC | PRN

## 2017-04-13 MED ORDER — MELOXICAM 7.5 MG PO TABS: 7.5000 mg | ORAL_TABLET | Freq: Every day | ORAL | 3 refills | Status: DC

## 2017-04-13 MED ORDER — FLUTICASONE PROPIONATE 50 MCG/ACT NA SUSP: 2.0000 | Freq: Every day | NASAL | 6 refills | Status: DC

## 2017-04-13 MED ORDER — LORATADINE 10 MG PO TABS: 10.0000 mg | ORAL_TABLET | Freq: Every day | ORAL | 5 refills | Status: DC

## 2017-04-13 MED FILL — MELOXICAM 7.5 MG TABLET: 7.5 | 30 days supply | Qty: 30 | Fill #0

## 2017-04-13 MED FILL — LISINOPRIL-HCTZ 20-12.5 MG: 20-12.5 | 30 days supply | Qty: 60 | Fill #0 | Status: TO

## 2017-04-13 MED FILL — FLUTICASONE PROP 50 MCG SPR: 50 | 30 days supply | Qty: 16 | Fill #0 | Status: TO

## 2017-04-13 NOTE — Progress Notes (Signed)
Subjective:  Patient ID: Emily Dickson, female    DOB: 05/29/1956  Age: 61 y.o. MRN: [id number]  CC: Follow-up visit  HPI Emily Dickson presents is a 61 year old female with a history of hypertension, hyperlipidemia, chronic rhinitis right knee pain who presents today for follow-up visit.  She is yet to take her antihypertensive this morning as she is fasting in anticipation of blood work hence her elevated blood pressure but prior to now she has been compliant with her antihypertensives and statin and tolerates them well. She uses Flonase and antihistamine for chronic rhinitis but does have some nasal congestion.  She has right knee osteoarthritis which has worsened since the last office visit, causing her to limp  She uses naproxen and has a knee brace which helps her . Endorses some swelling which she thinks could be related to her potassium.  Right knee x-ray from 03/2016 was unremarkable.  Past Medical History:  Diagnosis Date  . Back pain   . High cholesterol   . Hypertension     No past surgical history on file.  Allergies  Allergen Reactions  . Penicillins Rash    Has patient had a PCN reaction causing immediate rash, facial/tongue/throat swelling, SOB or lightheadedness with hypotension: No Has patient had a PCN reaction causing severe rash involving mucus membranes or skin necrosis: No Has patient had a PCN reaction that required hospitalization No Has patient had a PCN reaction occurring within the last 10 years: No If all of the above answers are "NO", then may proceed with Cephalosporin use.     Outpatient Medications Prior to Visit  Medication Sig Dispense Refill  . Multiple Vitamin (MULTIVITAMIN WITH MINERALS) TABS tablet Take 1 tablet by mouth daily.    Marland Kitchen atorvastatin (LIPITOR) 20 MG tablet Take 1 tablet (20 mg total) by mouth daily at 6 PM. 30 tablet 5  . fluticasone (FLONASE) 50 MCG/ACT nasal spray Place 2 sprays into both nostrils daily. 16 g 6  .  lisinopril-hydrochlorothiazide (ZESTORETIC) 20-12.5 MG tablet Take 2 tablets by mouth daily. 60 tablet 5  . loratadine (CLARITIN) 10 MG tablet Take 1 tablet (10 mg total) by mouth daily. 30 tablet 5  . naproxen (NAPROSYN) 500 MG tablet TAKE 1 TABLET BY MOUTH 2 TIMES DAILY WITH A MEAL. 60 tablet 5  . potassium chloride (KLOR-CON 10) 10 MEQ tablet Take 1 tablet (10 mEq total) by mouth daily. 30 tablet 3  . Biotin 1000 MCG tablet Take 1,000 mcg by mouth 3 (three) times daily.    . traMADol (ULTRAM) 50 MG tablet Take 1 tablet (50 mg total) by mouth every 12 (twelve) hours as needed. (Patient not taking: Reported on 04/13/2017) 60 tablet 1   No facility-administered medications prior to visit.     ROS Review of Systems  Constitutional: Negative for activity change, appetite change and fatigue.  HENT: Negative for congestion, sinus pressure and sore throat.   Eyes: Negative for visual disturbance.  Respiratory: Negative for cough, chest tightness, shortness of breath and wheezing.   Cardiovascular: Negative for chest pain and palpitations.  Gastrointestinal: Negative for abdominal distention, abdominal pain and constipation.  Endocrine: Negative for polydipsia.  Genitourinary: Negative for dysuria and frequency.  Musculoskeletal:       See hpi  Skin: Negative for rash.  Neurological: Negative for tremors, light-headedness and numbness.  Hematological: Does not bruise/bleed easily.  Psychiatric/Behavioral: Negative for agitation and behavioral problems.    Objective:  BP (!) 180/112 (BP Location: Left Arm,  Patient Position: Sitting, Cuff Size: Large) Comment: has not taken medications this medication  Pulse 67   Resp 16   Ht 5\' 6"  (1.676 m)   Wt 281 lb 6.4 oz (127.6 kg)   SpO2 97%   BMI 45.42 kg/m   BP/Weight 04/13/2017 01/11/2017 0/16/0109  Systolic BP 323 557 322  Diastolic BP 025 81 85  Wt. (Lbs) 281.4 280 267.6  BMI 45.42 43.85 41.91     Physical Exam Constitutional: She is  oriented to person, place, and time. She appears well-developed and well-nourished.  Cardiovascular: Normal rate, normal heart sounds and intact distal pulses.   No murmur heard. Pulmonary/Chest: Effort normal and breath sounds normal. She has no wheezes. She has no rales. She exhibits no tenderness.  Abdominal: Soft. Bowel sounds are normal. She exhibits no distension and no mass. There is no tenderness.  Musculoskeletal: Slight edema on right knee. Tenderness on the medial aspect of right knee and on flexion, extension  Neurological: She is alert and oriented to person, place, and time.  Skin: Skin is warm and dry.  Walks with a limp  Psychiatric: She has a normal mood and affect.   Assessment & Plan:   1. Chronic pain of right knee Uncontrolled Xray unrevealing Discussed Corticosteroid injections and she would like to hold off - traMADol (ULTRAM) 50 MG tablet; Take 1 tablet (50 mg total) by mouth every 12 (twelve) hours as needed.  Dispense: 60 tablet; Refill: 1 - meloxicam (MOBIC) 7.5 MG tablet; Take 1 tablet (7.5 mg total) by mouth daily.  Dispense: 30 tablet; Refill: 3  2. Seasonal allergic rhinitis due to other allergic trigger Stable - loratadine (CLARITIN) 10 MG tablet; Take 1 tablet (10 mg total) by mouth daily.  Dispense: 30 tablet; Refill: 5 - fluticasone (FLONASE) 50 MCG/ACT nasal spray; Place 2 sprays into both nostrils daily.  Dispense: 16 g; Refill: 6  3. Essential hypertension Uncontrolled due to not taking antihypertensives yet Low sodium diet - Comprehensive metabolic panel - lisinopril-hydrochlorothiazide (ZESTORETIC) 20-12.5 MG tablet; Take 2 tablets by mouth daily.  Dispense: 60 tablet; Refill: 5  4. Pure hypercholesterolemia Controlled Low cholesterol diet - Lipid panel - atorvastatin (LIPITOR) 20 MG tablet; Take 1 tablet (20 mg total) by mouth daily at 6 PM.  Dispense: 30 tablet; Refill: 5  5. Hypokalemia Stable - potassium chloride (KLOR-CON 10) 10 MEQ  tablet; Take 1 tablet (10 mEq total) by mouth daily.  Dispense: 30 tablet; Refill: 3   Meds ordered this encounter  Medications  . traMADol (ULTRAM) 50 MG tablet    Sig: Take 1 tablet (50 mg total) by mouth every 12 (twelve) hours as needed.    Dispense:  60 tablet    Refill:  1  . meloxicam (MOBIC) 7.5 MG tablet    Sig: Take 1 tablet (7.5 mg total) by mouth daily.    Dispense:  30 tablet    Refill:  3  . potassium chloride (KLOR-CON 10) 10 MEQ tablet    Sig: Take 1 tablet (10 mEq total) by mouth daily.    Dispense:  30 tablet    Refill:  3  . loratadine (CLARITIN) 10 MG tablet    Sig: Take 1 tablet (10 mg total) by mouth daily.    Dispense:  30 tablet    Refill:  5  . fluticasone (FLONASE) 50 MCG/ACT nasal spray    Sig: Place 2 sprays into both nostrils daily.    Dispense:  16 g  Refill:  6  . lisinopril-hydrochlorothiazide (ZESTORETIC) 20-12.5 MG tablet    Sig: Take 2 tablets by mouth daily.    Dispense:  60 tablet    Refill:  5  . atorvastatin (LIPITOR) 20 MG tablet    Sig: Take 1 tablet (20 mg total) by mouth daily at 6 PM.    Dispense:  30 tablet    Refill:  5    Follow-up: Return in about 3 months (around 07/14/2017) for Follow-up on hypertension and knee pain.   This note has been created with Surveyor, quantity. Any transcriptional errors are unintentional.     Arnoldo Morale MD

## 2017-04-13 NOTE — Progress Notes (Signed)
Follow up:  Discuss potassium, BP and knee pain RF for Tramadol or analgesic for knee pain: Mobic  Has not taken medication this morning: BP: 180/112

## 2017-04-14 ENCOUNTER — Encounter: Payer: Self-pay | Admitting: Family Medicine

## 2017-04-14 LAB — COMPREHENSIVE METABOLIC PANEL
ALT: 13 IU/L (ref 0–32)
AST: 17 IU/L (ref 0–40)
Albumin/Globulin Ratio: 1.4 (ref 1.2–2.2)
Albumin: 4.1 g/dL (ref 3.5–5.5)
Alkaline Phosphatase: 67 IU/L (ref 39–117)
BUN/Creatinine Ratio: 29 — ABNORMAL HIGH (ref 9–23)
BUN: 14 mg/dL (ref 6–24)
Bilirubin Total: 0.5 mg/dL (ref 0.0–1.2)
CALCIUM: 9.6 mg/dL (ref 8.7–10.2)
CO2: 27 mmol/L (ref 20–29)
CREATININE: 0.49 mg/dL — AB (ref 0.57–1.00)
Chloride: 100 mmol/L (ref 96–106)
GFR calc Af Amer: 123 mL/min/{1.73_m2} (ref >59)
GFR, EST NON AFRICAN AMERICAN: 107 mL/min/{1.73_m2} (ref >59)
Globulin, Total: 2.9 g/dL (ref 1.5–4.5)
Glucose: 102 mg/dL — ABNORMAL HIGH (ref 65–99)
Potassium: 4.1 mmol/L (ref 3.5–5.2)
Sodium: 140 mmol/L (ref 134–144)
Total Protein: 7 g/dL (ref 6.0–8.5)

## 2017-04-14 LAB — LIPID PANEL
CHOL/HDL RATIO: 2.8 ratio (ref 0.0–4.4)
Cholesterol, Total: 144 mg/dL (ref 100–199)
HDL: 51 mg/dL (ref >39)
LDL CALC: 82 mg/dL (ref 0–99)
TRIGLYCERIDES: 53 mg/dL (ref 0–149)
VLDL Cholesterol Cal: 11 mg/dL (ref 5–40)

## 2017-04-19 ENCOUNTER — Telehealth: Payer: Self-pay

## 2017-04-19 ENCOUNTER — Ambulatory Visit: Payer: Self-pay

## 2017-04-19 NOTE — Telephone Encounter (Signed)
Pt was called and informed of lab results. 

## 2017-04-21 ENCOUNTER — Ambulatory Visit: Payer: Self-pay

## 2017-04-21 ENCOUNTER — Other Ambulatory Visit: Payer: Self-pay | Admitting: Obstetrics and Gynecology

## 2017-04-21 DIAGNOSIS — Z1231 Encounter for screening mammogram for malignant neoplasm of breast: Secondary | ICD-10-CM

## 2017-04-27 ENCOUNTER — Encounter (HOSPITAL_COMMUNITY): Payer: Self-pay

## 2017-04-27 ENCOUNTER — Ambulatory Visit (HOSPITAL_COMMUNITY)
Admission: RE | Admit: 2017-04-27 | Discharge: 2017-04-27 | Disposition: A | Discharge status: Home / Self Care | Payer: Self-pay | Source: Ambulatory Visit | Attending: Obstetrics and Gynecology | Admitting: Obstetrics and Gynecology

## 2017-04-27 ENCOUNTER — Ambulatory Visit
Admission: RE | Admit: 2017-04-27 | Discharge: 2017-04-27 | Disposition: A | Discharge status: Home / Self Care | Payer: No Typology Code available for payment source | Source: Ambulatory Visit | Attending: Obstetrics and Gynecology | Admitting: Obstetrics and Gynecology

## 2017-04-27 VITALS — BP 144/84 | Temp 99.0°F | Ht 66.0 in | Wt 282.5 lb

## 2017-04-27 DIAGNOSIS — Z1239 Encounter for other screening for malignant neoplasm of breast: Secondary | ICD-10-CM

## 2017-04-27 DIAGNOSIS — Z1231 Encounter for screening mammogram for malignant neoplasm of breast: Secondary | ICD-10-CM

## 2017-04-27 NOTE — Progress Notes (Signed)
No complaints today.   Pap Smear: Pap smear not completed today. Last Pap smear was 04/18/2015 at [facility] and normal with negative HPV. Per patient has no history of an abnormal Pap smears.  Physical exam: Breasts Breasts symmetrical. No skin abnormalities bilateral breasts. No nipple retraction bilateral breasts. No nipple discharge bilateral breasts. No lymphadenopathy. No lumps palpated bilateral breasts. No complaints of pain or tenderness on exam. Referred patient to the [facility] for a screening mammogram. Appointment scheduled for Tuesday, April 27, 2017 at 1540.     Pelvic/Bimanual No Pap smear completed today since last Pap smear and HPV typing was 04/18/2015. Pap smear not indicated per [facility] guidelines.   Smoking History: Patient has never smoked.  Patient Navigation: Patient education provided. Access to services provided for patient through [facility] program.   Colorectal Cancer Screening: Per patient has never had a colonoscopy completed. No complaints today. FIT Test given to patient to complete and return to [facility].

## 2017-04-27 NOTE — Patient Instructions (Signed)
Explained breast self awareness with [name]. Patient did not need a Pap smear today due to last Pap smear and HPV typing was 04/18/2015. Let her know [facility] will cover Pap smears and HPV typing every 5 years unless has a history of abnormal Pap smears. Referred patient to the [facility] for a screening mammogram. Appointment scheduled for Tuesday, April 27, 2017 at 1540. [facility] know [facility] will [facility] up with her within the next couple weeks with results of mammogram by letter or phone. Emily Dickson verbalized understanding.  Axl Rodino, Arvil Chaco, RN 3:03 PM

## 2017-04-28 ENCOUNTER — Encounter (HOSPITAL_COMMUNITY): Payer: Self-pay

## 2017-04-28 ENCOUNTER — Other Ambulatory Visit: Payer: Self-pay | Admitting: Obstetrics and Gynecology

## 2017-05-10 LAB — FECAL OCCULT BLOOD, IMMUNOCHEMICAL: FECAL OCCULT BLD: NEGATIVE

## 2017-05-17 ENCOUNTER — Other Ambulatory Visit: Payer: Self-pay | Admitting: Family Medicine

## 2017-05-17 MED FILL — ATORVASTATIN 20 MG TABLET: 20 | 30 days supply | Qty: 30 | Fill #1 | Status: TO

## 2017-05-17 MED FILL — MELOXICAM 7.5 MG TABLET: 7.5 | 30 days supply | Qty: 30 | Fill #1 | Status: TO

## 2017-05-28 MED FILL — ?POTASSIUM CL ER 10MEQ TAB: 10 | 30 days supply | Qty: 30 | Fill #0 | Status: TO

## 2017-06-22 MED FILL — ?ATORVASTATIN 20 MG TABLET: 20 | 30 days supply | Qty: 30 | Fill #2

## 2017-06-22 MED FILL — LISINOPRIL-HCTZ 20-12.5 MG: 20-12.5 | 30 days supply | Qty: 60 | Fill #1

## 2017-06-22 MED FILL — MELOXICAM 7.5 MG TABLET: 7.5 | 30 days supply | Qty: 30 | Fill #2

## 2017-06-24 MED FILL — ?POTASSIUM CL ER 10MEQ TAB: 10 | 30 days supply | Qty: 30 | Fill #1 | Status: TO

## 2017-07-19 MED FILL — ?POTASSIUM CL ER 10MEQ TAB: 10 | 30 days supply | Qty: 30 | Fill #2 | Status: TO

## 2017-07-19 MED FILL — MELOXICAM 7.5 MG TABLET: 7.5 | 30 days supply | Qty: 30 | Fill #3

## 2017-07-19 MED FILL — ?ATORVASTATIN 20 MG TABLET: 20 | 30 days supply | Qty: 30 | Fill #3

## 2017-07-19 MED FILL — LISINOPRIL-HCTZ 20-12.5 MG: 20-12.5 | 30 days supply | Qty: 60 | Fill #2

## 2017-07-21 ENCOUNTER — Other Ambulatory Visit: Payer: Self-pay

## 2017-07-21 ENCOUNTER — Ambulatory Visit: Payer: Self-pay | Attending: Family Medicine | Admitting: Family Medicine

## 2017-07-21 ENCOUNTER — Encounter: Payer: Self-pay | Admitting: Family Medicine

## 2017-07-21 VITALS — BP 162/90 | HR 68 | Temp 98.4°F | Resp 16 | Wt 290.6 lb

## 2017-07-21 DIAGNOSIS — Z79899 Other long term (current) drug therapy: Secondary | ICD-10-CM | POA: Insufficient documentation

## 2017-07-21 DIAGNOSIS — J3089 Other allergic rhinitis: Secondary | ICD-10-CM

## 2017-07-21 DIAGNOSIS — Z09 Encounter for follow-up examination after completed treatment for conditions other than malignant neoplasm: Secondary | ICD-10-CM | POA: Insufficient documentation

## 2017-07-21 DIAGNOSIS — E876 Hypokalemia: Secondary | ICD-10-CM | POA: Insufficient documentation

## 2017-07-21 DIAGNOSIS — I10 Essential (primary) hypertension: Secondary | ICD-10-CM | POA: Insufficient documentation

## 2017-07-21 DIAGNOSIS — G8929 Other chronic pain: Secondary | ICD-10-CM | POA: Insufficient documentation

## 2017-07-21 DIAGNOSIS — Z6841 Body Mass Index (BMI) 40.0 and over, adult: Secondary | ICD-10-CM

## 2017-07-21 DIAGNOSIS — E7800 Pure hypercholesterolemia, unspecified: Secondary | ICD-10-CM | POA: Insufficient documentation

## 2017-07-21 DIAGNOSIS — J302 Other seasonal allergic rhinitis: Secondary | ICD-10-CM | POA: Insufficient documentation

## 2017-07-21 DIAGNOSIS — E119 Type 2 diabetes mellitus without complications: Secondary | ICD-10-CM | POA: Insufficient documentation

## 2017-07-21 DIAGNOSIS — Z23 Encounter for immunization: Secondary | ICD-10-CM | POA: Insufficient documentation

## 2017-07-21 DIAGNOSIS — Z880 Allergy status to penicillin: Secondary | ICD-10-CM | POA: Insufficient documentation

## 2017-07-21 DIAGNOSIS — M25561 Pain in right knee: Secondary | ICD-10-CM | POA: Insufficient documentation

## 2017-07-21 LAB — POCT GLYCOSYLATED HEMOGLOBIN (HGB A1C): Hemoglobin A1C: 6.6

## 2017-07-21 MED ORDER — AMLODIPINE BESYLATE 5 MG PO TABS: 5.0000 mg | ORAL_TABLET | Freq: Every day | ORAL | 5 refills | Status: DC

## 2017-07-21 MED ORDER — ATORVASTATIN CALCIUM 20 MG PO TABS: 20.0000 mg | ORAL_TABLET | Freq: Every day | ORAL | 5 refills | Status: DC

## 2017-07-21 MED ORDER — TRUE METRIX METER DEVI: 1.0000 | Freq: Every day | 0 refills | Status: DC

## 2017-07-21 MED ORDER — LISINOPRIL-HYDROCHLOROTHIAZIDE 20-12.5 MG PO TABS: 2.0000 | ORAL_TABLET | Freq: Every day | ORAL | 5 refills | Status: DC

## 2017-07-21 MED ORDER — METFORMIN HCL 500 MG PO TABS: 1000.0000 mg | ORAL_TABLET | Freq: Two times a day (BID) | ORAL | 3 refills | Status: DC

## 2017-07-21 MED ORDER — TRUEPLUS LANCETS 28G MISC: 1.0000 | Freq: Every day | 12 refills | Status: DC

## 2017-07-21 MED ORDER — POTASSIUM CHLORIDE ER 10 MEQ PO TBCR: 10.0000 meq | EXTENDED_RELEASE_TABLET | Freq: Every day | ORAL | 3 refills | Status: DC

## 2017-07-21 MED ORDER — FLUTICASONE PROPIONATE 50 MCG/ACT NA SUSP: 2.0000 | Freq: Every day | NASAL | 6 refills | Status: DC

## 2017-07-21 MED ORDER — MELOXICAM 7.5 MG PO TABS: 7.5000 mg | ORAL_TABLET | Freq: Every day | ORAL | 3 refills | Status: DC

## 2017-07-21 MED ORDER — TRAMADOL HCL 50 MG PO TABS: 50.0000 mg | ORAL_TABLET | Freq: Two times a day (BID) | ORAL | 1 refills | Status: DC | PRN

## 2017-07-21 MED FILL — ?METFORMIN HCL 500MG TABLET: 500 | 30 days supply | Qty: 120 | Fill #0

## 2017-07-21 MED FILL — AMLODIPINE BESYLATE 5 MG TA: 5 | 30 days supply | Qty: 30 | Fill #0

## 2017-07-21 MED FILL — TRUEplus LANCETS 28G MISC: 30 days supply | Qty: 100 | Fill #0

## 2017-07-21 MED FILL — !TRUE METRIX BLOOD GLUCOSE: 365 days supply | Qty: 1 | Fill #0

## 2017-07-21 NOTE — Progress Notes (Signed)
Pt states she is for f/u. Would like to discuss continued need for potassium

## 2017-07-21 NOTE — Progress Notes (Signed)
Subjective:  Patient ID: Emily Dickson, female    DOB: 02-Jul-1956  Age: 61 y.o. MRN: [id number]  CC: Hypertention  HPI Emily Dickson is a 61 year old female with a history of hypertension, hyperlipidemia, chronic rhinitis, right knee pain who presents today for follow-up visit. Screening A1c performed today returned at 6.6 and she has no previous history of diabetes mellitus.  She reports significant improvement in her knee pain and uses a knee brace.  Meloxicam has been helpful in controlling her symptoms.  Her blood pressure is elevated today despite her compliance with her antihypertensive.  She tolerates her medications with no side effects. She does not exercise much but has been working on reducing her carbs.  She continues to have nasal congestion and has been on Flonase and Claritin but denies sinus pressure, fever, sore throat or myalgias.  Past Medical History:  Diagnosis Date  . Back pain   . High cholesterol   . Hypertension     No past surgical history on file.  Allergies  Allergen Reactions  . Penicillins Rash    Has patient had a PCN reaction causing immediate rash, facial/tongue/throat swelling, SOB or lightheadedness with hypotension: No Has patient had a PCN reaction causing severe rash involving mucus membranes or skin necrosis: No Has patient had a PCN reaction that required hospitalization No Has patient had a PCN reaction occurring within the last 10 years: No If all of the above answers are "NO", then may proceed with Cephalosporin use.     Outpatient Medications Prior to Visit  Medication Sig Dispense Refill  . Biotin 1000 MCG tablet Take 1,000 mcg by mouth 3 (three) times daily.    . fluticasone (FLONASE) 50 MCG/ACT nasal spray Place 2 sprays into both nostrils daily. 16 g 6  . loratadine (CLARITIN) 10 MG tablet Take 1 tablet (10 mg total) by mouth daily. 30 tablet 5  . atorvastatin (LIPITOR) 20 MG tablet Take 1 tablet (20 mg total) by mouth  daily at 6 PM. 30 tablet 5  . fluticasone (FLONASE) 50 MCG/ACT nasal spray Place 2 sprays into both nostrils daily. 16 g 6  . lisinopril-hydrochlorothiazide (ZESTORETIC) 20-12.5 MG tablet Take 2 tablets by mouth daily. 60 tablet 5  . meloxicam (MOBIC) 7.5 MG tablet Take 1 tablet (7.5 mg total) by mouth daily. 30 tablet 3  . potassium chloride (KLOR-CON 10) 10 MEQ tablet Take 1 tablet (10 mEq total) by mouth daily. 30 tablet 3  . traMADol (ULTRAM) 50 MG tablet Take 1 tablet (50 mg total) by mouth every 12 (twelve) hours as needed. 60 tablet 1  . lisinopril-hydrochlorothiazide (ZESTORETIC) 20-12.5 MG tablet Take 2 tablets by mouth daily. 60 tablet 5  . Multiple Vitamin (MULTIVITAMIN WITH MINERALS) TABS tablet Take 1 tablet by mouth daily. (Patient not taking: Reported on 07/21/2017)    . loratadine (CLARITIN) 10 MG tablet Take 1 tablet (10 mg total) by mouth daily. 30 tablet 5   No facility-administered medications prior to visit.     ROS Review of Systems  Constitutional: Negative for activity change, appetite change and fatigue.  HENT: Negative for congestion, sinus pressure and sore throat.   Eyes: Negative for visual disturbance.  Respiratory: Negative for cough, chest tightness, shortness of breath and wheezing.   Cardiovascular: Negative for chest pain and palpitations.  Gastrointestinal: Negative for abdominal distention, abdominal pain and constipation.  Endocrine: Negative for polydipsia.  Genitourinary: Negative for dysuria and frequency.  Musculoskeletal: Negative for arthralgias and back pain.  Skin: Negative for rash.  Neurological: Negative for tremors, light-headedness and numbness.  Hematological: Does not bruise/bleed easily.  Psychiatric/Behavioral: Negative for agitation and behavioral problems.    Objective:  BP (!) 162/90 (BP Location: Left Arm, Patient Position: Sitting, Cuff Size: Large)   Pulse 68   Temp 98.4 F (36.9 C) (Oral)   Resp 16   Wt 290 lb 9.6 oz  (131.8 kg)   SpO2 98%   BMI 46.90 kg/m   BP/Weight 07/21/2017 3/71/0626 05/11/8545  Systolic BP 270 350 093  Diastolic BP 90 84 818  Wt. (Lbs) 290.6 282.5 281.4  BMI 46.9 45.6 45.42      Physical Exam  Constitutional: She is oriented to person, place, and time. She appears well-developed and well-nourished.  Obese  Cardiovascular: Normal rate, normal heart sounds and intact distal pulses.  No murmur heard. Pulmonary/Chest: Effort normal and breath sounds normal. She has no wheezes. She has no rales. She exhibits no tenderness.  Abdominal: Soft. Bowel sounds are normal. She exhibits no distension and no mass. There is no tenderness.  Musculoskeletal: Normal range of motion. She exhibits no edema or tenderness.  Neurological: She is alert and oriented to person, place, and time.  Skin: Skin is warm and dry.  Psychiatric: She has a normal mood and affect.     Lab Results  Component Value Date   HGBA1C 6.6 07/21/2017    Assessment & Plan:   1. Essential hypertension Uncontrolled Amlodipine added to regimen Low sodium, DASH diet, lifestyle modifications including weight loss - lisinopril-hydrochlorothiazide (ZESTORETIC) 20-12.5 MG tablet; Take 2 tablets daily by mouth.  Dispense: 60 tablet; Refill: 5 - CMP14+EGFR - amLODipine (NORVASC) 5 MG tablet; Take 1 tablet (5 mg total) daily by mouth.  Dispense: 30 tablet; Refill: 5  2. Seasonal allergic rhinitis due to other allergic trigger Uncontrolled Advised on compliance with nasal spray and antihistamine - fluticasone (FLONASE) 50 MCG/ACT nasal spray; Place 2 sprays daily into both nostrils.  Dispense: 16 g; Refill: 6  3. Need for influenza vaccination - Flu Vaccine QUAD 6+ mos PF IM (Fluarix Quad PF)  4. Chronic pain of right knee Improved significantly Continue the use of knee brace and application of ice - meloxicam (MOBIC) 7.5 MG tablet; Take 1 tablet (7.5 mg total) daily by mouth.  Dispense: 30 tablet; Refill: 3 -  traMADol (ULTRAM) 50 MG tablet; Take 1 tablet (50 mg total) every 12 (twelve) hours as needed by mouth.  Dispense: 60 tablet; Refill: 1  5. Type 2 diabetes mellitus without complication, without long-term current use of insulin ([place]) Newly diagnosed with A1c of 6.6 Diabetic diet, lifestyle modifications, weight loss Diabetic teaching provided by clinical pharmacist We will commence on metformin which will also help with weight loss - POCT glycosylated hemoglobin (Hb A1C) - Blood Glucose Monitoring Suppl (TRUE METRIX METER) DEVI; 1 each daily with breakfast by Does not apply route.  Dispense: 1 Device; Refill: 0 - TRUEPLUS LANCETS 28G MISC; 1 each daily with breakfast by Does not apply route.  Dispense: 30 each; Refill: 12 - metFORMIN (GLUCOPHAGE) 500 MG tablet; Take 2 tablets (1,000 mg total) 2 (two) times daily with a meal by mouth. Start out with 1 tab PO bid for one week  Dispense: 120 tablet; Refill: 3  6. Need for Tdap vaccination - Tdap vaccine greater than or equal to 7yo IM  7. Hypokalemia - potassium chloride (KLOR-CON 10) 10 MEQ tablet; Take 1 tablet (10 mEq total) daily by mouth.  Dispense:  30 tablet; Refill: 3  8. Pure hypercholesterolemia Controlled - atorvastatin (LIPITOR) 20 MG tablet; Take 1 tablet (20 mg total) daily at 6 PM by mouth.  Dispense: 30 tablet; Refill: 5  9. Class 3 severe obesity due to excess calories without serious comorbidity with body mass index (BMI) of 40.0 to 44.9 in adult [facility]) She has gained 23 pounds in the last 10 months Educated on caloric restriction, healthier food choices Increasing physical activity to 150 minutes/week   Meds ordered this encounter  Medications  . fluticasone (FLONASE) 50 MCG/ACT nasal spray    Sig: Place 2 sprays daily into both nostrils.    Dispense:  16 g    Refill:  6  . lisinopril-hydrochlorothiazide (ZESTORETIC) 20-12.5 MG tablet    Sig: Take 2 tablets daily by mouth.    Dispense:  60 tablet    Refill:  5  .  meloxicam (MOBIC) 7.5 MG tablet    Sig: Take 1 tablet (7.5 mg total) daily by mouth.    Dispense:  30 tablet    Refill:  3  . traMADol (ULTRAM) 50 MG tablet    Sig: Take 1 tablet (50 mg total) every 12 (twelve) hours as needed by mouth.    Dispense:  60 tablet    Refill:  1  . potassium chloride (KLOR-CON 10) 10 MEQ tablet    Sig: Take 1 tablet (10 mEq total) daily by mouth.    Dispense:  30 tablet    Refill:  3  . atorvastatin (LIPITOR) 20 MG tablet    Sig: Take 1 tablet (20 mg total) daily at 6 PM by mouth.    Dispense:  30 tablet    Refill:  5  . amLODipine (NORVASC) 5 MG tablet    Sig: Take 1 tablet (5 mg total) daily by mouth.    Dispense:  30 tablet    Refill:  5  . Blood Glucose Monitoring Suppl (TRUE METRIX METER) DEVI    Sig: 1 each daily with breakfast by Does not apply route.    Dispense:  1 Device    Refill:  0    Please dispense lancets and test strips  . TRUEPLUS LANCETS 28G MISC    Sig: 1 each daily with breakfast by Does not apply route.    Dispense:  30 each    Refill:  12  . metFORMIN (GLUCOPHAGE) 500 MG tablet    Sig: Take 2 tablets (1,000 mg total) 2 (two) times daily with a meal by mouth. Start out with 1 tab PO bid for one week    Dispense:  120 tablet    Refill:  3    Follow-up: Return in about 3 months (around 10/21/2017) for Follow up on Hypertension.   Arnoldo Morale MD

## 2017-07-21 NOTE — Progress Notes (Signed)
Provided diabetes education to the patient, including the following: what O1R was, complications of diabetes, information about metformin including adverse effects, and dietary changes including the plate method and decreasing sugar/carb intake. Patient to pick up glucometer today and will follow up with me if she has any issues using it.

## 2017-07-22 ENCOUNTER — Telehealth: Payer: Self-pay

## 2017-07-22 ENCOUNTER — Encounter: Payer: Self-pay | Admitting: Family Medicine

## 2017-07-22 LAB — CMP14+EGFR
ALBUMIN: 4.3 g/dL (ref 3.5–5.5)
ALK PHOS: 83 IU/L (ref 39–117)
ALT: 15 IU/L (ref 0–32)
AST: 16 IU/L (ref 0–40)
Albumin/Globulin Ratio: 1.3 (ref 1.2–2.2)
BUN/Creatinine Ratio: 19 (ref 9–23)
BUN: 11 mg/dL (ref 6–24)
Bilirubin Total: 0.4 mg/dL (ref 0.0–1.2)
CO2: 30 mmol/L — AB (ref 20–29)
CREATININE: 0.57 mg/dL (ref 0.57–1.00)
Calcium: 9.7 mg/dL (ref 8.7–10.2)
Chloride: 98 mmol/L (ref 96–106)
GFR calc Af Amer: 117 mL/min/{1.73_m2} (ref >59)
GFR, EST NON AFRICAN AMERICAN: 102 mL/min/{1.73_m2} (ref >59)
GLOBULIN, TOTAL: 3.2 g/dL (ref 1.5–4.5)
Glucose: 108 mg/dL — ABNORMAL HIGH (ref 65–99)
Potassium: 4.5 mmol/L (ref 3.5–5.2)
Sodium: 141 mmol/L (ref 134–144)
Total Protein: 7.5 g/dL (ref 6.0–8.5)

## 2017-07-22 NOTE — Telephone Encounter (Signed)
Pt was called and informed to contact office to go over lab results.

## 2017-07-23 ENCOUNTER — Telehealth: Payer: Self-pay | Admitting: Family Medicine

## 2017-07-23 NOTE — Telephone Encounter (Signed)
Pt was inform of the lab result auth by the nurse for me to tell her

## 2017-07-26 NOTE — Telephone Encounter (Signed)
Pt was called and informed of lab results. 

## 2017-08-02 ENCOUNTER — Other Ambulatory Visit: Payer: Self-pay

## 2017-08-02 MED ORDER — GLUCOSE BLOOD VI STRP: 1.0000 | ORAL_STRIP | Freq: Three times a day (TID) | 12 refills | Status: DC

## 2017-08-02 MED FILL — TRUE METRIX TEST STRIP: 30 days supply | Qty: 100 | Fill #0

## 2017-08-23 MED FILL — MELOXICAM 7.5 MG TABLET: 7.5 | 30 days supply | Qty: 30 | Fill #0

## 2017-08-23 MED FILL — ?METFORMIN HCL 500MG TABLET: 500 | 30 days supply | Qty: 120 | Fill #1

## 2017-08-23 MED FILL — LISINOPRIL-HCTZ 20-12.5 MG: 20-12.5 | 30 days supply | Qty: 60 | Fill #3

## 2017-08-23 MED FILL — ?POTASSIUM CL ER 10MEQ TAB: 10 | 30 days supply | Qty: 30 | Fill #3 | Status: TO

## 2017-08-23 MED FILL — AMLODIPINE BESYLATE 5 MG TA: 5 | 30 days supply | Qty: 30 | Fill #1

## 2017-08-23 MED FILL — ?ATORVASTATIN 20 MG TABLET: 20 | 30 days supply | Qty: 30 | Fill #4

## 2017-09-28 ENCOUNTER — Other Ambulatory Visit: Payer: Self-pay | Admitting: Family Medicine

## 2017-09-28 DIAGNOSIS — E876 Hypokalemia: Secondary | ICD-10-CM

## 2017-09-28 MED FILL — LISINOPRIL-HCTZ 20-12.5 MG: 20-12.5 | 30 days supply | Qty: 60 | Fill #4

## 2017-09-28 MED FILL — ?ATORVASTATIN 20MG TABLET: 20 | 30 days supply | Qty: 30 | Fill #5

## 2017-09-28 MED FILL — MELOXICAM 7.5 MG TABLET: 7.5 | 30 days supply | Qty: 30 | Fill #1

## 2017-09-28 MED FILL — ?AMLODIPINE BESYLATE 5 MG T: 5 MG | 30 days supply | Qty: 30 | Fill #2

## 2017-09-28 MED FILL — ?POTASSIUM CL ER 10MEQ TAB: 10 | 30 days supply | Qty: 30 | Fill #0

## 2017-09-28 MED FILL — ?METFORMIN HCL 500MG TABLET: 500 | 30 days supply | Qty: 120 | Fill #2

## 2017-10-21 ENCOUNTER — Encounter: Payer: Self-pay | Admitting: Family Medicine

## 2017-10-21 ENCOUNTER — Ambulatory Visit: Payer: Self-pay | Admitting: Family Medicine

## 2017-10-21 ENCOUNTER — Ambulatory Visit: Payer: Self-pay | Attending: Family Medicine | Admitting: Family Medicine

## 2017-10-21 DIAGNOSIS — Z79899 Other long term (current) drug therapy: Secondary | ICD-10-CM | POA: Insufficient documentation

## 2017-10-21 DIAGNOSIS — Z7984 Long term (current) use of oral hypoglycemic drugs: Secondary | ICD-10-CM | POA: Insufficient documentation

## 2017-10-21 DIAGNOSIS — I10 Essential (primary) hypertension: Secondary | ICD-10-CM

## 2017-10-21 DIAGNOSIS — Z880 Allergy status to penicillin: Secondary | ICD-10-CM | POA: Insufficient documentation

## 2017-10-21 DIAGNOSIS — J310 Chronic rhinitis: Secondary | ICD-10-CM | POA: Insufficient documentation

## 2017-10-21 DIAGNOSIS — E876 Hypokalemia: Secondary | ICD-10-CM

## 2017-10-21 DIAGNOSIS — E119 Type 2 diabetes mellitus without complications: Secondary | ICD-10-CM

## 2017-10-21 DIAGNOSIS — E785 Hyperlipidemia, unspecified: Secondary | ICD-10-CM | POA: Insufficient documentation

## 2017-10-21 DIAGNOSIS — E7800 Pure hypercholesterolemia, unspecified: Secondary | ICD-10-CM

## 2017-10-21 DIAGNOSIS — G8929 Other chronic pain: Secondary | ICD-10-CM

## 2017-10-21 DIAGNOSIS — M25561 Pain in right knee: Secondary | ICD-10-CM | POA: Insufficient documentation

## 2017-10-21 DIAGNOSIS — Z791 Long term (current) use of non-steroidal anti-inflammatories (NSAID): Secondary | ICD-10-CM | POA: Insufficient documentation

## 2017-10-21 DIAGNOSIS — J3089 Other allergic rhinitis: Secondary | ICD-10-CM

## 2017-10-21 MED ORDER — LISINOPRIL-HYDROCHLOROTHIAZIDE 20-12.5 MG PO TABS: 2.0000 | ORAL_TABLET | Freq: Every day | ORAL | 5 refills | Status: DC

## 2017-10-21 MED ORDER — POTASSIUM CHLORIDE ER 10 MEQ PO TBCR: 10.0000 meq | EXTENDED_RELEASE_TABLET | Freq: Every day | ORAL | 3 refills | Status: DC

## 2017-10-21 MED ORDER — FLUTICASONE PROPIONATE 50 MCG/ACT NA SUSP: 2.0000 | Freq: Every day | NASAL | 6 refills | Status: DC

## 2017-10-21 MED ORDER — ATORVASTATIN CALCIUM 20 MG PO TABS: 20.0000 mg | ORAL_TABLET | Freq: Every day | ORAL | 5 refills | Status: DC

## 2017-10-21 MED ORDER — CARVEDILOL 3.125 MG PO TABS: 3.1250 mg | ORAL_TABLET | Freq: Two times a day (BID) | ORAL | 3 refills | Status: DC

## 2017-10-21 MED ORDER — LORATADINE 10 MG PO TABS: 10.0000 mg | ORAL_TABLET | Freq: Every day | ORAL | 5 refills | Status: DC

## 2017-10-21 MED ORDER — METFORMIN HCL 500 MG PO TABS: 1000.0000 mg | ORAL_TABLET | Freq: Two times a day (BID) | ORAL | 3 refills | Status: DC

## 2017-10-21 MED ORDER — MELOXICAM 7.5 MG PO TABS: 7.5000 mg | ORAL_TABLET | Freq: Every day | ORAL | 3 refills | Status: DC

## 2017-10-21 MED FILL — ?CARVEDILOL 3.125 MG TABLET: 3.125 | 30 days supply | Qty: 60 | Fill #0

## 2017-10-21 NOTE — Progress Notes (Signed)
Subjective:  Patient ID: Emily Dickson, female    DOB: Oct 18, 1955  Age: 62 y.o. MRN: [id number]  CC: Hypertension   HPI Emily Dickson is a 62 year old female with a history of hypertension, hyperlipidemia, chronic rhinitis, right knee pain, newly Diagnosed Type 2 DM )(A1c 6.6)  who presents today for follow-up visit.  She was commenced on Metformin at her last visit and is doing well on it with a weight loss of 11lbs. Her fasting sugars are usually in the 100s and she denies hypoglycemia.  Her knees have not been as painful and she uses a knee brace intermittently as well as  Mobic.  She tolerates her antihypertensive and statin and has no adverse effects from her medications.  Past Medical History:  Diagnosis Date  . Back pain   . High cholesterol   . Hypertension     History reviewed. No pertinent surgical history.  Allergies  Allergen Reactions  . Penicillins Rash    Has patient had a PCN reaction causing immediate rash, facial/tongue/throat swelling, SOB or lightheadedness with hypotension: No Has patient had a PCN reaction causing severe rash involving mucus membranes or skin necrosis: No Has patient had a PCN reaction that required hospitalization No Has patient had a PCN reaction occurring within the last 10 years: No If all of the above answers are "NO", then may proceed with Cephalosporin use.     Outpatient Medications Prior to Visit  Medication Sig Dispense Refill  . Blood Glucose Monitoring Suppl (TRUE METRIX METER) DEVI 1 each daily with breakfast by Does not apply route. 1 Device 0  . glucose blood (TRUE METRIX BLOOD GLUCOSE TEST) test strip 1 each by Other route 3 (three) times daily. 100 each 12  . traMADol (ULTRAM) 50 MG tablet Take 1 tablet (50 mg total) every 12 (twelve) hours as needed by mouth. 60 tablet 1  . TRUEPLUS LANCETS 28G MISC 1 each daily with breakfast by Does not apply route. 30 each 12  . amLODipine (NORVASC) 5 MG tablet Take 1  tablet (5 mg total) daily by mouth. 30 tablet 5  . atorvastatin (LIPITOR) 20 MG tablet Take 1 tablet (20 mg total) daily at 6 PM by mouth. 30 tablet 5  . fluticasone (FLONASE) 50 MCG/ACT nasal spray Place 2 sprays into both nostrils daily. 16 g 6  . fluticasone (FLONASE) 50 MCG/ACT nasal spray Place 2 sprays daily into both nostrils. 16 g 6  . lisinopril-hydrochlorothiazide (ZESTORETIC) 20-12.5 MG tablet Take 2 tablets by mouth daily. 60 tablet 5  . lisinopril-hydrochlorothiazide (ZESTORETIC) 20-12.5 MG tablet Take 2 tablets daily by mouth. 60 tablet 5  . loratadine (CLARITIN) 10 MG tablet Take 1 tablet (10 mg total) by mouth daily. 30 tablet 5  . meloxicam (MOBIC) 7.5 MG tablet Take 1 tablet (7.5 mg total) daily by mouth. 30 tablet 3  . metFORMIN (GLUCOPHAGE) 500 MG tablet Take 2 tablets (1,000 mg total) 2 (two) times daily with a meal by mouth. Start out with 1 tab PO bid for one week 120 tablet 3  . potassium chloride (K-DUR) 10 MEQ tablet TAKE 1 TABLET (10 MEQ TOTAL) BY MOUTH DAILY. 30 tablet 0  . Biotin 1000 MCG tablet Take 1,000 mcg by mouth 3 (three) times daily.    . Multiple Vitamin (MULTIVITAMIN WITH MINERALS) TABS tablet Take 1 tablet by mouth daily. (Patient not taking: Reported on 07/21/2017)     No facility-administered medications prior to visit.     ROS Review of  Systems  Constitutional: Negative for activity change, appetite change and fatigue.  HENT: Negative for congestion, sinus pressure and sore throat.   Eyes: Negative for visual disturbance.  Respiratory: Negative for cough, chest tightness, shortness of breath and wheezing.   Cardiovascular: Negative for chest pain and palpitations.  Gastrointestinal: Negative for abdominal distention, abdominal pain and constipation.  Endocrine: Negative for polydipsia.  Genitourinary: Negative for dysuria and frequency.  Musculoskeletal: Negative for arthralgias and back pain.  Skin: Negative for rash.  Neurological: Negative for  tremors, light-headedness and numbness.  Hematological: Does not bruise/bleed easily.  Psychiatric/Behavioral: Negative for agitation and behavioral problems.    Objective:  BP (!) 144/83   Pulse 68   Temp 98.1 F (36.7 C) (Oral)   Ht 5' 6"  (1.676 m)   Wt 279 lb (126.6 kg)   SpO2 99%   BMI 45.03 kg/m   BP/Weight 10/21/2017 07/21/2017 11/26/252  Systolic BP 270 623 762  Diastolic BP 83 90 84  Wt. (Lbs) 279 290.6 282.5  BMI 45.03 46.9 45.6      Physical Exam  Constitutional: She is oriented to person, place, and time. She appears well-developed and well-nourished.  Cardiovascular: Normal rate, normal heart sounds and intact distal pulses.  No murmur heard. Pulmonary/Chest: Effort normal and breath sounds normal. She has no wheezes. She has no rales. She exhibits no tenderness.  Abdominal: Soft. Bowel sounds are normal. She exhibits no distension and no mass. There is no tenderness.  Musculoskeletal: Normal range of motion.  Neurological: She is alert and oriented to person, place, and time.  Psychiatric: She has a normal mood and affect.     CMP Latest Ref Rng & Units 07/21/2017 04/13/2017 01/11/2017  Glucose 65 - 99 mg/dL 108(H) 102(H) 121(H)  BUN 6 - 24 mg/dL 11 14 13   Creatinine 0.57 - 1.00 mg/dL 0.57 0.49(L) 0.57  Sodium 134 - 144 mmol/L 141 140 143  Potassium 3.5 - 5.2 mmol/L 4.5 4.1 3.3(L)  Chloride 96 - 106 mmol/L 98 100 97  CO2 20 - 29 mmol/L 30(H) 27 30(H)  Calcium 8.7 - 10.2 mg/dL 9.7 9.6 9.4  Total Protein 6.0 - 8.5 g/dL 7.5 7.0 7.0  Total Bilirubin 0.0 - 1.2 mg/dL 0.4 0.5 0.6  Alkaline Phos 39 - 117 IU/L 83 67 64  AST 0 - 40 IU/L 16 17 23   ALT 0 - 32 IU/L 15 13 28     Lipid Panel     Component Value Date/Time   CHOL 144 04/13/2017 1001   TRIG 53 04/13/2017 1001   HDL 51 04/13/2017 1001   CHOLHDL 2.8 04/13/2017 1001   CHOLHDL 2.6 03/11/2016 1043   VLDL 13 03/11/2016 1043   LDLCALC 82 04/13/2017 1001    Lab Results  Component Value Date   HGBA1C  6.6 07/21/2017    Assessment & Plan:   1. Pure hypercholesterolemia Controlled Low cholesterol diet - atorvastatin (LIPITOR) 20 MG tablet; Take 1 tablet (20 mg total) by mouth daily at 6 PM.  Dispense: 30 tablet; Refill: 5 - Lipid panel  2. Essential hypertension Slight elevated No regimen change today Counseled on blood pressure goal of less than 130/80, low-sodium, DASH diet, medication compliance, 150 minutes of moderate intensity exercise per week. Discussed medication compliance, adverse effects. - carvedilol (COREG) 3.125 MG tablet; Take 1 tablet (3.125 mg total) by mouth 2 (two) times daily with a meal.  Dispense: 60 tablet; Refill: 3 - lisinopril-hydrochlorothiazide (ZESTORETIC) 20-12.5 MG tablet; Take 2 tablets by mouth daily.  Dispense: 60 tablet; Refill: 5 - CMP14+EGFR  3. Type 2 diabetes mellitus without complication, without long-term current use of insulin ([facility]) Controlled with A1c of 6.6 Diabetic health maintenance at next visit - metFORMIN (GLUCOPHAGE) 500 MG tablet; Take 2 tablets (1,000 mg total) by mouth 2 (two) times daily with a meal. Start out with 1 tab PO bid for one week  Dispense: 120 tablet; Refill: 3 - Hemoglobin A1c - Microalbumin/Creatinine Ratio, Urine  4. Chronic pain of right knee Stable - meloxicam (MOBIC) 7.5 MG tablet; Take 1 tablet (7.5 mg total) by mouth daily.  Dispense: 30 tablet; Refill: 3  5. Seasonal allergic rhinitis due to other allergic trigger Controlled - loratadine (CLARITIN) 10 MG tablet; Take 1 tablet (10 mg total) by mouth daily.  Dispense: 30 tablet; Refill: 5 - fluticasone (FLONASE) 50 MCG/ACT nasal spray; Place 2 sprays into both nostrils daily.  Dispense: 16 g; Refill: 6  6. Hypokalemia Controlled - potassium chloride (K-DUR) 10 MEQ tablet; Take 1 tablet (10 mEq total) by mouth daily.  Dispense: 30 tablet; Refill: 3   Meds ordered this encounter  Medications  . carvedilol (COREG) 3.125 MG tablet    Sig: Take 1 tablet  (3.125 mg total) by mouth 2 (two) times daily with a meal.    Dispense:  60 tablet    Refill:  3    Discontinue Amlodipine  . atorvastatin (LIPITOR) 20 MG tablet    Sig: Take 1 tablet (20 mg total) by mouth daily at 6 PM.    Dispense:  30 tablet    Refill:  5  . lisinopril-hydrochlorothiazide (ZESTORETIC) 20-12.5 MG tablet    Sig: Take 2 tablets by mouth daily.    Dispense:  60 tablet    Refill:  5  . metFORMIN (GLUCOPHAGE) 500 MG tablet    Sig: Take 2 tablets (1,000 mg total) by mouth 2 (two) times daily with a meal. Start out with 1 tab PO bid for one week    Dispense:  120 tablet    Refill:  3  . meloxicam (MOBIC) 7.5 MG tablet    Sig: Take 1 tablet (7.5 mg total) by mouth daily.    Dispense:  30 tablet    Refill:  3  . loratadine (CLARITIN) 10 MG tablet    Sig: Take 1 tablet (10 mg total) by mouth daily.    Dispense:  30 tablet    Refill:  5  . potassium chloride (K-DUR) 10 MEQ tablet    Sig: Take 1 tablet (10 mEq total) by mouth daily.    Dispense:  30 tablet    Refill:  3  . fluticasone (FLONASE) 50 MCG/ACT nasal spray    Sig: Place 2 sprays into both nostrils daily.    Dispense:  16 g    Refill:  6    Follow-up: Return in about 3 months (around 01/18/2018) for Follow-up on chronic medical conditions.   Charlott Rakes MD

## 2017-10-22 LAB — LIPID PANEL
CHOLESTEROL TOTAL: 144 mg/dL (ref 100–199)
Chol/HDL Ratio: 2.6 ratio (ref 0.0–4.4)
HDL: 55 mg/dL (ref >39)
LDL Calculated: 77 mg/dL (ref 0–99)
Triglycerides: 58 mg/dL (ref 0–149)
VLDL CHOLESTEROL CAL: 12 mg/dL (ref 5–40)

## 2017-10-22 LAB — CMP14+EGFR
ALBUMIN: 4.5 g/dL (ref 3.5–5.5)
ALK PHOS: 69 IU/L (ref 39–117)
ALT: 10 IU/L (ref 0–32)
AST: 11 IU/L (ref 0–40)
Albumin/Globulin Ratio: 1.3 (ref 1.2–2.2)
BILIRUBIN TOTAL: 0.3 mg/dL (ref 0.0–1.2)
BUN / CREAT RATIO: 16 (ref 9–23)
BUN: 10 mg/dL (ref 6–24)
CHLORIDE: 99 mmol/L (ref 96–106)
CO2: 26 mmol/L (ref 20–29)
Calcium: 10.1 mg/dL (ref 8.7–10.2)
Creatinine, Ser: 0.62 mg/dL (ref 0.57–1.00)
GFR calc Af Amer: 114 mL/min/{1.73_m2} (ref >59)
GFR calc non Af Amer: 99 mL/min/{1.73_m2} (ref >59)
GLUCOSE: 99 mg/dL (ref 65–99)
Globulin, Total: 3.4 g/dL (ref 1.5–4.5)
POTASSIUM: 4.2 mmol/L (ref 3.5–5.2)
Sodium: 142 mmol/L (ref 134–144)
Total Protein: 7.9 g/dL (ref 6.0–8.5)

## 2017-10-22 LAB — MICROALBUMIN / CREATININE URINE RATIO
Creatinine, Urine: 69.5 mg/dL
MICROALB/CREAT RATIO: 9.4 mg/g{creat} (ref 0.0–30.0)
Microalbumin, Urine: 6.5 ug/mL

## 2017-10-22 LAB — HEMOGLOBIN A1C
ESTIMATED AVERAGE GLUCOSE: 134 mg/dL
Hgb A1c MFr Bld: 6.3 % — ABNORMAL HIGH (ref 4.8–5.6)

## 2017-10-25 MED FILL — MELOXICAM 7.5 MG TABLET: 7.5 | 30 days supply | Qty: 30 | Fill #2

## 2017-10-25 MED FILL — ?METFORMIN HCL 500MG TABLET: 500 | 30 days supply | Qty: 120 | Fill #3

## 2017-10-25 MED FILL — ?ATORVASTATIN 20MG TABLET: 20 | 30 days supply | Qty: 30 | Fill #0

## 2017-10-25 MED FILL — LISINOPRIL-HCTZ 20-12.5 MG: 20-12.5 | 30 days supply | Qty: 60 | Fill #5

## 2017-10-25 MED FILL — ?POTASSIUM CL ER 10 MEQ TAB: 10 MEQ | 30 days supply | Qty: 30 | Fill #0

## 2017-10-28 ENCOUNTER — Telehealth: Payer: Self-pay

## 2017-10-28 ENCOUNTER — Ambulatory Visit: Payer: Self-pay | Admitting: Family Medicine

## 2017-10-28 NOTE — Telephone Encounter (Signed)
Patient was called and informed of lab results. 

## 2017-11-01 ENCOUNTER — Ambulatory Visit: Payer: Self-pay | Attending: Family Medicine

## 2017-12-03 MED FILL — POTASSIUM CL 10 MEQ TAB SA: 10 | 30 days supply | Qty: 30 | Fill #1

## 2017-12-03 MED FILL — MELOXICAM 7.5 MG TABLET: 7.5 | 30 days supply | Qty: 30 | Fill #3

## 2017-12-03 MED FILL — LISINOPRIL-HCTZ 20-12.5 MG: 20-12.5 | 30 days supply | Qty: 60 | Fill #0

## 2017-12-03 MED FILL — CARVEDILOL 3.125 MG TABLET: 3.125 | 30 days supply | Qty: 60 | Fill #1

## 2017-12-03 MED FILL — TRUE METRIX TEST STRIP: 30 days supply | Qty: 100 | Fill #1

## 2017-12-03 MED FILL — ATORVASTATIN 20 MG TABLET: 20 | 30 days supply | Qty: 30 | Fill #1

## 2017-12-06 MED FILL — ?METFORMIN HCL 500MG TABLET: 500 | 30 days supply | Qty: 120 | Fill #0

## 2017-12-28 MED FILL — LISINOPRIL-HCTZ 20-12.5 MG: 20-12.5 | 30 days supply | Qty: 60 | Fill #1

## 2017-12-30 ENCOUNTER — Ambulatory Visit: Payer: Self-pay | Attending: Family Medicine | Admitting: Physician Assistant

## 2017-12-30 VITALS — BP 156/85 | HR 59 | Temp 98.9°F | Resp 16 | Ht 66.0 in | Wt 267.6 lb

## 2017-12-30 DIAGNOSIS — I10 Essential (primary) hypertension: Secondary | ICD-10-CM | POA: Insufficient documentation

## 2017-12-30 DIAGNOSIS — E119 Type 2 diabetes mellitus without complications: Secondary | ICD-10-CM | POA: Insufficient documentation

## 2017-12-30 DIAGNOSIS — Z880 Allergy status to penicillin: Secondary | ICD-10-CM | POA: Insufficient documentation

## 2017-12-30 DIAGNOSIS — Z7984 Long term (current) use of oral hypoglycemic drugs: Secondary | ICD-10-CM | POA: Insufficient documentation

## 2017-12-30 DIAGNOSIS — Z79899 Other long term (current) drug therapy: Secondary | ICD-10-CM | POA: Insufficient documentation

## 2017-12-30 DIAGNOSIS — E7800 Pure hypercholesterolemia, unspecified: Secondary | ICD-10-CM | POA: Insufficient documentation

## 2017-12-30 LAB — GLUCOSE, POCT (MANUAL RESULT ENTRY): POC Glucose: 118 mg/dl — AB (ref 70–99)

## 2017-12-30 NOTE — Progress Notes (Signed)
Patient ID: Emily Dickson, female   DOB: 03/17/1956, 62 y.o.   MRN: [id number]   Chassity Ludke, is a 62 y.o. female  [id number]  MPN:[id number]  DOB - May 24, 1956  Subjective:  Chief Complaint and HPI: Emily Dickson is a 62 y.o. female here today because she had stopped taking her Lisinopril/HCT for about a week bc the pills looked different when she got the prescription.  Her BP has been running about 150/100.  She got a ne prescription and restarted the lisinopril/HCT 4 days ago.  She denies any CP/SOB/ dizziness/HA.  No vision changes.    ROS:   Constitutional:  No f/c, No night sweats, No unexplained weight loss. EENT:  No vision changes, No blurry vision, No hearing changes. No mouth, throat, or ear problems.  Respiratory: No cough, No SOB Cardiac: No CP, no palpitations GI:  No abd pain, No N/V/D. GU: No Urinary s/sx Musculoskeletal: No joint pain Neuro: No headache, no dizziness, no motor weakness.  Skin: No rash Endocrine:  No polydipsia. No polyuria.  Psych: Denies SI/HI  No problems updated.  ALLERGIES: Allergies  Allergen Reactions  . Penicillins Rash    Has patient had a PCN reaction causing immediate rash, facial/tongue/throat swelling, SOB or lightheadedness with hypotension: No Has patient had a PCN reaction causing severe rash involving mucus membranes or skin necrosis: No Has patient had a PCN reaction that required hospitalization No Has patient had a PCN reaction occurring within the last 10 years: No If all of the above answers are "NO", then may proceed with Cephalosporin use.    PAST MEDICAL HISTORY: Past Medical History:  Diagnosis Date  . Back pain   . High cholesterol   . Hypertension     MEDICATIONS AT HOME: Prior to Admission medications   Medication Sig Start Date End Date Taking? Authorizing Provider  atorvastatin (LIPITOR) 20 MG tablet Take 1 tablet (20 mg total) by mouth daily at 6 PM. 10/21/17  Yes Newlin, Enobong, MD  Blood  Glucose Monitoring Suppl (TRUE METRIX METER) DEVI 1 each daily with breakfast by Does not apply route. 07/21/17  Yes Charlott Rakes, MD  carvedilol (COREG) 3.125 MG tablet Take 1 tablet (3.125 mg total) by mouth 2 (two) times daily with a meal. 10/21/17  Yes Newlin, Enobong, MD  fluticasone (FLONASE) 50 MCG/ACT nasal spray Place 2 sprays into both nostrils daily. 10/21/17  Yes Newlin, Enobong, MD  glucose blood (TRUE METRIX BLOOD GLUCOSE TEST) test strip 1 each by Other route 3 (three) times daily. 08/02/17  Yes Charlott Rakes, MD  lisinopril-hydrochlorothiazide (ZESTORETIC) 20-12.5 MG tablet Take 2 tablets by mouth daily. 10/21/17  Yes Charlott Rakes, MD  loratadine (CLARITIN) 10 MG tablet Take 1 tablet (10 mg total) by mouth daily. 10/21/17  Yes Charlott Rakes, MD  meloxicam (MOBIC) 7.5 MG tablet Take 1 tablet (7.5 mg total) by mouth daily. 10/21/17  Yes Charlott Rakes, MD  metFORMIN (GLUCOPHAGE) 500 MG tablet Take 2 tablets (1,000 mg total) by mouth 2 (two) times daily with a meal. Start out with 1 tab PO bid for one week 10/21/17  Yes Newlin, Enobong, MD  potassium chloride (K-DUR) 10 MEQ tablet Take 1 tablet (10 mEq total) by mouth daily. 10/21/17  Yes Charlott Rakes, MD  traMADol (ULTRAM) 50 MG tablet Take 1 tablet (50 mg total) every 12 (twelve) hours as needed by mouth. 07/21/17  Yes Newlin, Enobong, MD  TRUEPLUS LANCETS 28G MISC 1 each daily with breakfast by Does not apply route. 07/21/17  Yes Charlott Rakes, MD  Biotin 1000 MCG tablet Take 1,000 mcg by mouth 3 (three) times daily.    [provider]  Multiple Vitamin (MULTIVITAMIN WITH MINERALS) TABS tablet Take 1 tablet by mouth daily. Patient not taking: Reported on 07/21/2017 06/12/15   Barton Dubois, MD  lisinopril (PRINIVIL,ZESTRIL) 40 MG tablet Take 1 tablet (40 mg total) by mouth daily. 06/20/12 10/17/12  Calvert Cantor, MD     Objective:  EXAM:   Vitals:   12/30/17 1020  BP: (!) 156/85  Pulse: (!) 59  Resp: 16    Temp: 98.9 F (37.2 C)  TempSrc: Oral  SpO2: 98%  Weight: 267 lb 9.6 oz (121.4 kg)  Height: 5\' 6"  (1.676 m)    General appearance : A&OX3. NAD. Non-toxic-appearing HEENT: Atraumatic and Normocephalic.  PERRLA. EOM intact.   Neck: supple, no JVD. No cervical lymphadenopathy. No thyromegaly Chest/Lungs:  Breathing-non-labored, Good air entry bilaterally, breath sounds normal without rales, rhonchi, or wheezing  CVS: S1 S2 regular, no murmurs, gallops, rubs  Extremities: Bilateral Lower Ext shows no edema, both legs are warm to touch with = pulse throughout Neurology:  CN II-XII grossly intact, Non focal.   Psych:  TP linear. J/I WNL. Normal speech. Appropriate eye contact and affect.  Skin:  No Rash  Data Review Lab Results  Component Value Date   HGBA1C 6.3 (H) 10/21/2017   HGBA1C 6.6 07/21/2017   HGBA1C 5.9 (H) 06/10/2015     Assessment & Plan  1. Hypertension-improving-was off meds for a week and restarted them 4 days ago and BP is improving.  Stay on meds as directed.  Check BP 1-2 times daily and record and keep f/up appt.    2. Type 2 diabetes mellitus without complication, without long-term current use of insulin (HCC) Controlled, continue current regimen - Glucose (CBG)     Patient have been counseled extensively about nutrition and exercise  Return for keep 01/19/2018 appt with Dr Margarita Rana for BP recheck.  The patient was given clear instructions to go to ER or return to medical center if symptoms don't improve, worsen or new problems develop. The patient verbalized understanding. The patient was told to call to get lab results if they haven't heard anything in the next week.     Freeman Caldron, PA-C [facility] [place], [place]   12/30/2017, 10:35 AM

## 2017-12-30 NOTE — Patient Instructions (Signed)
Check blood pressure 1-2 times daily and record and bring to your next visit.     Hypertension Hypertension is another name for high blood pressure. High blood pressure forces your heart to work harder to pump blood. This can cause problems over time. There are two numbers in a blood pressure reading. There is a top number (systolic) over a bottom number (diastolic). It is best to have a blood pressure below 120/80. Healthy choices can help lower your blood pressure. You may need medicine to help lower your blood pressure if:  Your blood pressure cannot be lowered with healthy choices.  Your blood pressure is higher than 130/80.  Follow these instructions at home: Eating and drinking  If directed, follow the DASH eating plan. This diet includes: ? Filling half of your plate at each meal with fruits and vegetables. ? Filling one quarter of your plate at each meal with whole grains. Whole grains include whole wheat pasta, brown rice, and whole grain bread. ? Eating or drinking low-fat dairy products, such as skim milk or low-fat yogurt. ? Filling one quarter of your plate at each meal with low-fat (lean) proteins. Low-fat proteins include fish, skinless chicken, eggs, beans, and tofu. ? Avoiding fatty meat, cured and processed meat, or chicken with skin. ? Avoiding premade or processed food.  Eat less than 1,500 mg of salt (sodium) a day.  Limit alcohol use to no more than 1 drink a day for nonpregnant women and 2 drinks a day for men. One drink equals 12 oz of beer, 5 oz of wine, or 1 oz of hard liquor. Lifestyle  Work with your doctor to stay at a healthy weight or to lose weight. Ask your doctor what the best weight is for you.  Get at least 30 minutes of exercise that causes your heart to beat faster (aerobic exercise) most days of the week. This may include walking, swimming, or biking.  Get at least 30 minutes of exercise that strengthens your muscles (resistance exercise) at  least 3 days a week. This may include lifting weights or pilates.  Do not use any products that contain nicotine or tobacco. This includes cigarettes and e-cigarettes. If you need help quitting, ask your doctor.  Check your blood pressure at home as told by your doctor.  Keep all follow-up visits as told by your doctor. This is important. Medicines  Take over-the-counter and prescription medicines only as told by your doctor. Follow directions carefully.  Do not skip doses of blood pressure medicine. The medicine does not work as well if you skip doses. Skipping doses also puts you at risk for problems.  Ask your doctor about side effects or reactions to medicines that you should watch for. Contact a doctor if:  You think you are having a reaction to the medicine you are taking.  You have headaches that keep coming back (recurring).  You feel dizzy.  You have swelling in your ankles.  You have trouble with your vision. Get help right away if:  You get a very bad headache.  You start to feel confused.  You feel weak or numb.  You feel faint.  You get very bad pain in your: ? Chest. ? Belly (abdomen).  You throw up (vomit) more than once.  You have trouble breathing. Summary  Hypertension is another name for high blood pressure.  Making healthy choices can help lower blood pressure. If your blood pressure cannot be controlled with healthy choices, you may need  to take medicine. This information is not intended to replace advice given to you by your health care provider. Make sure you discuss any questions you have with your health care provider. Document Released: 02/10/2008 Document Revised: 07/22/2016 Document Reviewed: 07/22/2016 Elsevier Interactive Patient Education  [employer].

## 2017-12-30 NOTE — Progress Notes (Signed)
Patient is concern her blood pressure is high in the morning.

## 2018-01-18 MED FILL — CARVEDILOL 3.125 MG TABLET: 3.125 | 30 days supply | Qty: 60 | Fill #2

## 2018-01-18 MED FILL — ?METFORMIN HCL 500MG TABLET: 500 | 30 days supply | Qty: 120 | Fill #1

## 2018-01-18 MED FILL — POTASSIUM CL 10 MEQ TAB SA: 10 | 30 days supply | Qty: 30 | Fill #2

## 2018-01-18 MED FILL — LISINOPRIL-HCTZ 20-12.5 MG: 20-12.5 | 30 days supply | Qty: 60 | Fill #2

## 2018-01-18 MED FILL — ATORVASTATIN 20 MG TABLET: 20 | 30 days supply | Qty: 30 | Fill #2

## 2018-01-18 MED FILL — MELOXICAM 7.5 MG TABLET: 7.5 | 30 days supply | Qty: 30 | Fill #0

## 2018-01-19 ENCOUNTER — Encounter: Payer: Self-pay | Admitting: Family Medicine

## 2018-01-19 ENCOUNTER — Ambulatory Visit: Payer: Self-pay | Attending: Family Medicine | Admitting: Family Medicine

## 2018-01-19 VITALS — BP 171/93 | HR 61 | Temp 97.9°F | Ht 66.0 in | Wt 261.4 lb

## 2018-01-19 DIAGNOSIS — Z79899 Other long term (current) drug therapy: Secondary | ICD-10-CM | POA: Insufficient documentation

## 2018-01-19 DIAGNOSIS — J3089 Other allergic rhinitis: Secondary | ICD-10-CM

## 2018-01-19 DIAGNOSIS — Z7951 Long term (current) use of inhaled steroids: Secondary | ICD-10-CM | POA: Insufficient documentation

## 2018-01-19 DIAGNOSIS — E876 Hypokalemia: Secondary | ICD-10-CM | POA: Insufficient documentation

## 2018-01-19 DIAGNOSIS — E119 Type 2 diabetes mellitus without complications: Secondary | ICD-10-CM | POA: Insufficient documentation

## 2018-01-19 DIAGNOSIS — Z791 Long term (current) use of non-steroidal anti-inflammatories (NSAID): Secondary | ICD-10-CM | POA: Insufficient documentation

## 2018-01-19 DIAGNOSIS — J302 Other seasonal allergic rhinitis: Secondary | ICD-10-CM | POA: Insufficient documentation

## 2018-01-19 DIAGNOSIS — Z6841 Body Mass Index (BMI) 40.0 and over, adult: Secondary | ICD-10-CM | POA: Insufficient documentation

## 2018-01-19 DIAGNOSIS — Z7984 Long term (current) use of oral hypoglycemic drugs: Secondary | ICD-10-CM | POA: Insufficient documentation

## 2018-01-19 DIAGNOSIS — Z23 Encounter for immunization: Secondary | ICD-10-CM

## 2018-01-19 DIAGNOSIS — Z880 Allergy status to penicillin: Secondary | ICD-10-CM | POA: Insufficient documentation

## 2018-01-19 DIAGNOSIS — T502X5A Adverse effect of carbonic-anhydrase inhibitors, benzothiadiazides and other diuretics, initial encounter: Secondary | ICD-10-CM | POA: Insufficient documentation

## 2018-01-19 DIAGNOSIS — M25561 Pain in right knee: Secondary | ICD-10-CM | POA: Insufficient documentation

## 2018-01-19 DIAGNOSIS — E7800 Pure hypercholesterolemia, unspecified: Secondary | ICD-10-CM | POA: Insufficient documentation

## 2018-01-19 DIAGNOSIS — I10 Essential (primary) hypertension: Secondary | ICD-10-CM | POA: Insufficient documentation

## 2018-01-19 MED ORDER — POTASSIUM CHLORIDE ER 10 MEQ PO TBCR: 10.0000 meq | EXTENDED_RELEASE_TABLET | Freq: Every day | ORAL | 6 refills | Status: DC

## 2018-01-19 MED ORDER — ATORVASTATIN CALCIUM 20 MG PO TABS: 20.0000 mg | ORAL_TABLET | Freq: Every day | ORAL | 6 refills | Status: DC

## 2018-01-19 MED ORDER — LISINOPRIL-HYDROCHLOROTHIAZIDE 20-12.5 MG PO TABS: 2.0000 | ORAL_TABLET | Freq: Every day | ORAL | 6 refills | Status: DC

## 2018-01-19 MED ORDER — FLUTICASONE PROPIONATE 50 MCG/ACT NA SUSP: 2.0000 | Freq: Every day | NASAL | 6 refills | Status: DC

## 2018-01-19 MED ORDER — METFORMIN HCL 500 MG PO TABS: 1000.0000 mg | ORAL_TABLET | Freq: Two times a day (BID) | ORAL | 6 refills | Status: DC

## 2018-01-19 MED ORDER — CARVEDILOL 3.125 MG PO TABS: 3.1250 mg | ORAL_TABLET | Freq: Two times a day (BID) | ORAL | 6 refills | Status: DC

## 2018-01-19 NOTE — Patient Instructions (Signed)
Diabetes Mellitus and Nutrition When you have diabetes (diabetes mellitus), it is very important to have healthy eating habits because your blood sugar (glucose) levels are greatly affected by what you eat and drink. Eating healthy foods in the appropriate amounts, at about the same times every day, can help you:  Control your blood glucose.  Lower your risk of heart disease.  Improve your blood pressure.  Reach or maintain a healthy weight.  Every person with diabetes is different, and each person has different needs for a meal plan. Your health care provider may recommend that you work with a diet and nutrition specialist (dietitian) to make a meal plan that is best for you. Your meal plan may vary depending on factors such as:  The calories you need.  The medicines you take.  Your weight.  Your blood glucose, blood pressure, and cholesterol levels.  Your activity level.  Other health conditions you have, such as heart or kidney disease.  How do carbohydrates affect me? Carbohydrates affect your blood glucose level more than any other type of food. Eating carbohydrates naturally increases the amount of glucose in your blood. Carbohydrate counting is a method for keeping track of how many carbohydrates you eat. Counting carbohydrates is important to keep your blood glucose at a healthy level, especially if you use insulin or take certain oral diabetes medicines. It is important to know how many carbohydrates you can safely have in each meal. This is different for every person. Your dietitian can help you calculate how many carbohydrates you should have at each meal and for snack. Foods that contain carbohydrates include:  Bread, cereal, rice, pasta, and crackers.  Potatoes and corn.  Peas, beans, and lentils.  Milk and yogurt.  Fruit and juice.  Desserts, such as cakes, cookies, ice cream, and candy.  How does alcohol affect me? Alcohol can cause a sudden decrease in blood  glucose (hypoglycemia), especially if you use insulin or take certain oral diabetes medicines. Hypoglycemia can be a life-threatening condition. Symptoms of hypoglycemia (sleepiness, dizziness, and confusion) are similar to symptoms of having too much alcohol. If your health care provider says that alcohol is safe for you, follow these guidelines:  Limit alcohol intake to no more than 1 drink per day for nonpregnant women and 2 drinks per day for men. One drink equals 12 oz of beer, 5 oz of wine, or 1 oz of hard liquor.  Do not drink on an empty stomach.  Keep yourself hydrated with water, diet soda, or unsweetened iced tea.  Keep in mind that regular soda, juice, and other mixers may contain a lot of sugar and must be counted as carbohydrates.  What are tips for following this plan? Reading food labels  Start by checking the serving size on the label. The amount of calories, carbohydrates, fats, and other nutrients listed on the label are based on one serving of the food. Many foods contain more than one serving per package.  Check the total grams (g) of carbohydrates in one serving. You can calculate the number of servings of carbohydrates in one serving by dividing the total carbohydrates by 15. For example, if a food has 30 g of total carbohydrates, it would be equal to 2 servings of carbohydrates.  Check the number of grams (g) of saturated and trans fats in one serving. Choose foods that have low or no amount of these fats.  Check the number of milligrams (mg) of sodium in one serving. Most people   should limit total sodium intake to less than 2,300 mg per day.  Always check the nutrition information of foods labeled as "low-fat" or "nonfat". These foods may be higher in added sugar or refined carbohydrates and should be avoided.  Talk to your dietitian to identify your daily goals for nutrients listed on the label. Shopping  Avoid buying canned, premade, or processed foods. These  foods tend to be high in fat, sodium, and added sugar.  Shop around the outside edge of the grocery store. This includes fresh fruits and vegetables, bulk grains, fresh meats, and fresh dairy. Cooking  Use low-heat cooking methods, such as baking, instead of high-heat cooking methods like deep frying.  Cook using healthy oils, such as olive, canola, or sunflower oil.  Avoid cooking with butter, cream, or high-fat meats. Meal planning  Eat meals and snacks regularly, preferably at the same times every day. Avoid going long periods of time without eating.  Eat foods high in fiber, such as fresh fruits, vegetables, beans, and whole grains. Talk to your dietitian about how many servings of carbohydrates you can eat at each meal.  Eat 4-6 ounces of lean protein each day, such as lean meat, chicken, fish, eggs, or tofu. 1 ounce is equal to 1 ounce of meat, chicken, or fish, 1 egg, or 1/4 cup of tofu.  Eat some foods each day that contain healthy fats, such as avocado, nuts, seeds, and fish. Lifestyle   Check your blood glucose regularly.  Exercise at least 30 minutes 5 or more days each week, or as told by your health care provider.  Take medicines as told by your health care provider.  Do not use any products that contain nicotine or tobacco, such as cigarettes and e-cigarettes. If you need help quitting, ask your health care provider.  Work with a counselor or diabetes educator to identify strategies to manage stress and any emotional and social challenges. What are some questions to ask my health care provider?  Do I need to meet with a diabetes educator?  Do I need to meet with a dietitian?  What number can I call if I have questions?  When are the best times to check my blood glucose? Where to find more information:  American Diabetes Association: diabetes.org/food-and-fitness/food  Academy of Nutrition and Dietetics:  www.eatright.org/resources/health/diseases-and-conditions/diabetes  National Institute of Diabetes and Digestive and Kidney Diseases (NIH): www.niddk.nih.gov/health-information/diabetes/overview/diet-eating-physical-activity Summary  A healthy meal plan will help you control your blood glucose and maintain a healthy lifestyle.  Working with a diet and nutrition specialist (dietitian) can help you make a meal plan that is best for you.  Keep in mind that carbohydrates and alcohol have immediate effects on your blood glucose levels. It is important to count carbohydrates and to use alcohol carefully. This information is not intended to replace advice given to you by your health care provider. Make sure you discuss any questions you have with your health care provider. Document Released: 05/21/2005 Document Revised: 09/28/2016 Document Reviewed: 09/28/2016 Elsevier Interactive Patient Education  2018 Elsevier Inc.  

## 2018-01-19 NOTE — Progress Notes (Signed)
Subjective:  Patient ID: Emily Dickson, female    DOB: [birth year]  Age: 62 y.o. MRN: [id number]  CC: Hypertension   HPI Emily Dickson  is a 62 year old female with a history of hypertension, hyperlipidemia, chronic rhinitis, right knee pain, Type 2 DM (A1c 6.3)  who presents today for follow-up visit. She has lost 30 lbs in the last 6 months and is excited. She has been working on a diabetic diet and exercising and she is doing well on Metformin. She denies numbness in her extremities or visual concerns. She tolerated her antihypertensive and has been compliant with her statin with no complains of adverse effects. Her blood pressure is elevated today as she took only Lisinopril/HCTZ with plans to take Coreg after her meal however she informs me her blood pressure is usually elevated in the mornings to the 160s but in the evening systolic pressures are in the 100s. She has also noticed improvement in her knee symptoms.   Past Medical History:  Diagnosis Date  . Back pain   . High cholesterol   . Hypertension     History reviewed. No pertinent surgical history.  Allergies  Allergen Reactions  . Penicillins Rash    Has patient had a PCN reaction causing immediate rash, facial/tongue/throat swelling, SOB or lightheadedness with hypotension: No Has patient had a PCN reaction causing severe rash involving mucus membranes or skin necrosis: No Has patient had a PCN reaction that required hospitalization No Has patient had a PCN reaction occurring within the last 10 years: No If all of the above answers are "NO", then may proceed with Cephalosporin use.     Outpatient Medications Prior to Visit  Medication Sig Dispense Refill  . Blood Glucose Monitoring Suppl (TRUE METRIX METER) DEVI 1 each daily with breakfast by Does not apply route. 1 Device 0  . glucose blood (TRUE METRIX BLOOD GLUCOSE TEST) test strip 1 each by Other route 3 (three) times daily. 100 each 12  . loratadine  (CLARITIN) 10 MG tablet Take 1 tablet (10 mg total) by mouth daily. 30 tablet 5  . meloxicam (MOBIC) 7.5 MG tablet Take 1 tablet (7.5 mg total) by mouth daily. 30 tablet 3  . traMADol (ULTRAM) 50 MG tablet Take 1 tablet (50 mg total) every 12 (twelve) hours as needed by mouth. 60 tablet 1  . TRUEPLUS LANCETS 28G MISC 1 each daily with breakfast by Does not apply route. 30 each 12  . atorvastatin (LIPITOR) 20 MG tablet Take 1 tablet (20 mg total) by mouth daily at 6 PM. 30 tablet 5  . carvedilol (COREG) 3.125 MG tablet Take 1 tablet (3.125 mg total) by mouth 2 (two) times daily with a meal. 60 tablet 3  . fluticasone (FLONASE) 50 MCG/ACT nasal spray Place 2 sprays into both nostrils daily. 16 g 6  . lisinopril-hydrochlorothiazide (ZESTORETIC) 20-12.5 MG tablet Take 2 tablets by mouth daily. 60 tablet 5  . metFORMIN (GLUCOPHAGE) 500 MG tablet Take 2 tablets (1,000 mg total) by mouth 2 (two) times daily with a meal. Start out with 1 tab PO bid for one week 120 tablet 3  . potassium chloride (K-DUR) 10 MEQ tablet Take 1 tablet (10 mEq total) by mouth daily. 30 tablet 3  . Biotin 1000 MCG tablet Take 1,000 mcg by mouth 3 (three) times daily.    . Multiple Vitamin (MULTIVITAMIN WITH MINERALS) TABS tablet Take 1 tablet by mouth daily. (Patient not taking: Reported on 07/21/2017)  No facility-administered medications prior to visit.     ROS Review of Systems  Constitutional: Negative for activity change, appetite change and fatigue.  HENT: Negative for congestion, sinus pressure and sore throat.   Eyes: Negative for visual disturbance.  Respiratory: Negative for cough, chest tightness, shortness of breath and wheezing.   Cardiovascular: Positive for leg swelling. Negative for chest pain and palpitations.  Gastrointestinal: Negative for abdominal distention, abdominal pain and constipation.  Endocrine: Negative for polydipsia.  Genitourinary: Negative for dysuria and frequency.  Musculoskeletal:  Negative for arthralgias and back pain.  Skin: Negative for rash.  Neurological: Negative for tremors, light-headedness and numbness.  Hematological: Does not bruise/bleed easily.  Psychiatric/Behavioral: Negative for agitation and behavioral problems.    Objective:  BP (!) 171/93   Pulse 61   Temp 97.9 F (36.6 C) (Oral)   Ht 5' 6"  (1.676 m)   Wt 261 lb 6.4 oz (118.6 kg)   SpO2 98%   BMI 42.19 kg/m   BP/Weight 01/19/2018 12/30/2017 10/21/863  Systolic BP 784 696 295  Diastolic BP 93 85 83  Wt. (Lbs) 261.4 267.6 279  BMI 42.19 43.19 45.03      Physical Exam  Constitutional: She is oriented to person, place, and time. She appears well-developed and well-nourished.  Cardiovascular: Normal rate, normal heart sounds and intact distal pulses.  No murmur heard. Pulmonary/Chest: Effort normal and breath sounds normal. She has no wheezes. She has no rales. She exhibits no tenderness.  Abdominal: Soft. Bowel sounds are normal. She exhibits no distension and no mass. There is no tenderness.  Musculoskeletal: Normal range of motion. She exhibits edema (L ankle non pitting edema).  Neurological: She is alert and oriented to person, place, and time.  Skin: Skin is warm and dry.  Psychiatric: She has a normal mood and affect.    Lab Results  Component Value Date   HGBA1C 6.3 (H) 10/21/2017     Assessment & Plan:   1. Type 2 diabetes mellitus without complication, without long-term current use of insulin (HCC) Controlled with A1c of 6.3 Continue current management - CMP14+EGFR - Lipid panel - metFORMIN (GLUCOPHAGE) 500 MG tablet; Take 2 tablets (1,000 mg total) by mouth 2 (two) times daily with a meal. Start out with 1 tab PO bid for one week  Dispense: 120 tablet; Refill: 6  2. Hypokalemia Secondary to diuretic - potassium chloride (K-DUR) 10 MEQ tablet; Take 1 tablet (10 mEq total) by mouth daily.  Dispense: 30 tablet; Refill: 6  3. Essential  hypertension Uncontrolled Advised to take both  - lisinopril-hydrochlorothiazide (ZESTORETIC) 20-12.5 MG tablet; Take 2 tablets by mouth daily.  Dispense: 60 tablet; Refill: 6 - carvedilol (COREG) 3.125 MG tablet; Take 1 tablet (3.125 mg total) by mouth 2 (two) times daily with a meal.  Dispense: 60 tablet; Refill: 6  4. Seasonal allergic rhinitis due to other allergic trigger Controlled - fluticasone (FLONASE) 50 MCG/ACT nasal spray; Place 2 sprays into both nostrils daily.  Dispense: 16 g; Refill: 6  5. Pure hypercholesterolemia Controlled - atorvastatin (LIPITOR) 20 MG tablet; Take 1 tablet (20 mg total) by mouth daily at 6 PM.  Dispense: 30 tablet; Refill: 6  6. Need for vaccination against Streptococcus pneumoniae Pneumovax administered  7. Class 3 severe obesity due to excess calories without serious comorbidity with body mass index (BMI) of 40.0 to 44.9 in adult [facility]) She has lost 30 lbs in the last 6 months and has been commended Continue working on caloric restriction,  exercise   Meds ordered this encounter  Medications  . potassium chloride (K-DUR) 10 MEQ tablet    Sig: Take 1 tablet (10 mEq total) by mouth daily.    Dispense:  30 tablet    Refill:  6  . metFORMIN (GLUCOPHAGE) 500 MG tablet    Sig: Take 2 tablets (1,000 mg total) by mouth 2 (two) times daily with a meal. Start out with 1 tab PO bid for one week    Dispense:  120 tablet    Refill:  6  . lisinopril-hydrochlorothiazide (ZESTORETIC) 20-12.5 MG tablet    Sig: Take 2 tablets by mouth daily.    Dispense:  60 tablet    Refill:  6  . fluticasone (FLONASE) 50 MCG/ACT nasal spray    Sig: Place 2 sprays into both nostrils daily.    Dispense:  16 g    Refill:  6  . carvedilol (COREG) 3.125 MG tablet    Sig: Take 1 tablet (3.125 mg total) by mouth 2 (two) times daily with a meal.    Dispense:  60 tablet    Refill:  6  . atorvastatin (LIPITOR) 20 MG tablet    Sig: Take 1 tablet (20 mg total) by mouth daily at  6 PM.    Dispense:  30 tablet    Refill:  6    Follow-up: Return for complete physical exam.   Charlott Rakes MD

## 2018-01-20 ENCOUNTER — Telehealth: Payer: Self-pay

## 2018-01-20 LAB — CMP14+EGFR
ALT: 14 [IU]/L (ref 0–32)
AST: 15 [IU]/L (ref 0–40)
Albumin/Globulin Ratio: 1.5 (ref 1.2–2.2)
Albumin: 4.2 g/dL (ref 3.6–4.8)
Alkaline Phosphatase: 49 [IU]/L (ref 39–117)
BUN/Creatinine Ratio: 22 (ref 12–28)
BUN: 12 mg/dL (ref 8–27)
Bilirubin Total: 0.4 mg/dL (ref 0.0–1.2)
CO2: 27 mmol/L (ref 20–29)
Calcium: 9.7 mg/dL (ref 8.7–10.3)
Chloride: 101 mmol/L (ref 96–106)
Creatinine, Ser: 0.54 mg/dL — ABNORMAL LOW (ref 0.57–1.00)
GFR calc Af Amer: 119 mL/min/{1.73_m2}
GFR calc non Af Amer: 103 mL/min/{1.73_m2}
Globulin, Total: 2.8 g/dL (ref 1.5–4.5)
Glucose: 93 mg/dL (ref 65–99)
Potassium: 4 mmol/L (ref 3.5–5.2)
Sodium: 143 mmol/L (ref 134–144)
Total Protein: 7 g/dL (ref 6.0–8.5)

## 2018-01-20 LAB — LIPID PANEL
Chol/HDL Ratio: 2.7 ratio (ref 0.0–4.4)
Cholesterol, Total: 126 mg/dL (ref 100–199)
HDL: 46 mg/dL (ref >39)
LDL Calculated: 69 mg/dL (ref 0–99)
Triglycerides: 55 mg/dL (ref 0–149)
VLDL Cholesterol Cal: 11 mg/dL (ref 5–40)

## 2018-01-20 NOTE — Telephone Encounter (Signed)
-----   Message from Charlott Rakes, MD sent at 01/20/2018  2:15 PM EDT ----- Please inform the patient that labs are normal. Thank you.

## 2018-01-20 NOTE — Telephone Encounter (Signed)
Patient was called and informed of lab results. 

## 2018-02-02 ENCOUNTER — Other Ambulatory Visit: Payer: Self-pay

## 2018-02-02 MED ORDER — PNEUMOCOCCAL VAC POLYVALENT 25 MCG/0.5ML IJ INJ: 0.5000 mL | INJECTION | Freq: Once | INTRAMUSCULAR | 0 refills | Status: AC

## 2018-02-02 MED FILL — $PNEUMOVAX 23 VIAL: 25 | 1 days supply | Qty: 1 | Fill #0

## 2018-02-22 MED FILL — POTASSIUM CL 10 MEQ TAB SA: 10 | 30 days supply | Qty: 30 | Fill #3

## 2018-02-22 MED FILL — CARVEDILOL 3.125 MG TABLET: 3.125 | 30 days supply | Qty: 60 | Fill #3

## 2018-02-22 MED FILL — ATORVASTATIN 20 MG TABLET: 20 | 30 days supply | Qty: 30 | Fill #3

## 2018-02-22 MED FILL — LISINOPRIL-HCTZ 20-12.5 MG: 20-12.5 | 30 days supply | Qty: 60 | Fill #3

## 2018-02-22 MED FILL — metFORMIN HCL 500 MG TABS: 500 | 30 days supply | Qty: 120 | Fill #2

## 2018-03-15 ENCOUNTER — Ambulatory Visit: Payer: Self-pay | Attending: Family Medicine | Admitting: Family Medicine

## 2018-03-15 ENCOUNTER — Encounter: Payer: Self-pay | Admitting: Family Medicine

## 2018-03-15 VITALS — BP 167/89 | HR 63 | Temp 98.2°F | Ht 66.0 in | Wt 261.8 lb

## 2018-03-15 DIAGNOSIS — M25561 Pain in right knee: Secondary | ICD-10-CM | POA: Insufficient documentation

## 2018-03-15 DIAGNOSIS — Z1231 Encounter for screening mammogram for malignant neoplasm of breast: Secondary | ICD-10-CM

## 2018-03-15 DIAGNOSIS — Z1211 Encounter for screening for malignant neoplasm of colon: Secondary | ICD-10-CM

## 2018-03-15 DIAGNOSIS — Z1239 Encounter for other screening for malignant neoplasm of breast: Secondary | ICD-10-CM

## 2018-03-15 DIAGNOSIS — Z7984 Long term (current) use of oral hypoglycemic drugs: Secondary | ICD-10-CM | POA: Insufficient documentation

## 2018-03-15 DIAGNOSIS — I10 Essential (primary) hypertension: Secondary | ICD-10-CM | POA: Insufficient documentation

## 2018-03-15 DIAGNOSIS — E7800 Pure hypercholesterolemia, unspecified: Secondary | ICD-10-CM | POA: Insufficient documentation

## 2018-03-15 DIAGNOSIS — Z0001 Encounter for general adult medical examination with abnormal findings: Secondary | ICD-10-CM | POA: Insufficient documentation

## 2018-03-15 DIAGNOSIS — G8929 Other chronic pain: Secondary | ICD-10-CM | POA: Insufficient documentation

## 2018-03-15 DIAGNOSIS — Z124 Encounter for screening for malignant neoplasm of cervix: Secondary | ICD-10-CM

## 2018-03-15 DIAGNOSIS — Z79899 Other long term (current) drug therapy: Secondary | ICD-10-CM | POA: Insufficient documentation

## 2018-03-15 DIAGNOSIS — Z0000 Encounter for general adult medical examination without abnormal findings: Secondary | ICD-10-CM

## 2018-03-15 DIAGNOSIS — Z880 Allergy status to penicillin: Secondary | ICD-10-CM | POA: Insufficient documentation

## 2018-03-15 DIAGNOSIS — R600 Localized edema: Secondary | ICD-10-CM | POA: Insufficient documentation

## 2018-03-15 DIAGNOSIS — Z791 Long term (current) use of non-steroidal anti-inflammatories (NSAID): Secondary | ICD-10-CM | POA: Insufficient documentation

## 2018-03-15 MED ORDER — MELOXICAM 7.5 MG PO TABS: 7.5000 mg | ORAL_TABLET | Freq: Every day | ORAL | 3 refills | Status: DC

## 2018-03-15 MED ORDER — CARVEDILOL 6.25 MG PO TABS: 6.2500 mg | ORAL_TABLET | Freq: Two times a day (BID) | ORAL | 3 refills | Status: DC

## 2018-03-15 MED ORDER — FUROSEMIDE 20 MG PO TABS: 20.0000 mg | ORAL_TABLET | Freq: Every day | ORAL | 3 refills | Status: DC

## 2018-03-15 MED FILL — ATORVASTATIN 20 MG TABLET: 20 | 30 days supply | Qty: 30 | Fill #4

## 2018-03-15 MED FILL — CARVEDILOL 6.25 MG TABLET: 6.25 | 30 days supply | Qty: 60 | Fill #0

## 2018-03-15 MED FILL — MELOXICAM 7.5 MG TABLET: 7.5 | 30 days supply | Qty: 30 | Fill #0

## 2018-03-15 MED FILL — POTASSIUM CL 10 MEQ TAB SA: 10 | 30 days supply | Qty: 30 | Fill #0

## 2018-03-15 MED FILL — FUROSEMIDE 20 MG TABLET: 20 | 30 days supply | Qty: 30 | Fill #0

## 2018-03-15 MED FILL — metFORMIN HCL 500 MG TABS: 500 | 30 days supply | Qty: 120 | Fill #3

## 2018-03-15 MED FILL — LISINOPRIL-HCTZ 20-12.5 MG: 20-12.5 | 30 days supply | Qty: 60 | Fill #4

## 2018-03-15 NOTE — Patient Instructions (Signed)

## 2018-03-15 NOTE — Progress Notes (Signed)
Subjective:  Patient ID: Emily Dickson, female    DOB: Oct 20, 1955  Age: 62 y.o. MRN: [id number]  CC: Annual Exam   HPI Emily Dickson presents for complete physical exam. Last Pap smear was in 04/2015 and was normal; last mammogram was negative for malignancy in 04/2017 and she is yet to have a colonoscopy. She complains of intermittent pedal edema which does not respond to the use of compression stockings denies shortness of breath or chest pain.  Past Medical History:  Diagnosis Date  . Back pain   . High cholesterol   . Hypertension     No past surgical history on file.  Allergies  Allergen Reactions  . Penicillins Rash    Has patient had a PCN reaction causing immediate rash, facial/tongue/throat swelling, SOB or lightheadedness with hypotension: No Has patient had a PCN reaction causing severe rash involving mucus membranes or skin necrosis: No Has patient had a PCN reaction that required hospitalization No Has patient had a PCN reaction occurring within the last 10 years: No If all of the above answers are "NO", then may proceed with Cephalosporin use.      Outpatient Medications Prior to Visit  Medication Sig Dispense Refill  . atorvastatin (LIPITOR) 20 MG tablet Take 1 tablet (20 mg total) by mouth daily at 6 PM. 30 tablet 6  . Blood Glucose Monitoring Suppl (TRUE METRIX METER) DEVI 1 each daily with breakfast by Does not apply route. 1 Device 0  . fluticasone (FLONASE) 50 MCG/ACT nasal spray Place 2 sprays into both nostrils daily. 16 g 6  . glucose blood (TRUE METRIX BLOOD GLUCOSE TEST) test strip 1 each by Other route 3 (three) times daily. 100 each 12  . lisinopril-hydrochlorothiazide (ZESTORETIC) 20-12.5 MG tablet Take 2 tablets by mouth daily. 60 tablet 6  . loratadine (CLARITIN) 10 MG tablet Take 1 tablet (10 mg total) by mouth daily. 30 tablet 5  . metFORMIN (GLUCOPHAGE) 500 MG tablet Take 2 tablets (1,000 mg total) by mouth 2 (two) times daily with a  meal. Start out with 1 tab PO bid for one week 120 tablet 6  . potassium chloride (K-DUR) 10 MEQ tablet Take 1 tablet (10 mEq total) by mouth daily. 30 tablet 6  . traMADol (ULTRAM) 50 MG tablet Take 1 tablet (50 mg total) every 12 (twelve) hours as needed by mouth. 60 tablet 1  . TRUEPLUS LANCETS 28G MISC 1 each daily with breakfast by Does not apply route. 30 each 12  . carvedilol (COREG) 3.125 MG tablet Take 1 tablet (3.125 mg total) by mouth 2 (two) times daily with a meal. 60 tablet 6  . meloxicam (MOBIC) 7.5 MG tablet Take 1 tablet (7.5 mg total) by mouth daily. 30 tablet 3  . Biotin 1000 MCG tablet Take 1,000 mcg by mouth 3 (three) times daily.    . Multiple Vitamin (MULTIVITAMIN WITH MINERALS) TABS tablet Take 1 tablet by mouth daily. (Patient not taking: Reported on 07/21/2017)     No facility-administered medications prior to visit.     ROS Review of Systems  Constitutional: Negative for activity change, appetite change and fatigue.  HENT: Negative for congestion, sinus pressure and sore throat.   Eyes: Negative for visual disturbance.  Respiratory: Negative for cough, chest tightness, shortness of breath and wheezing.   Cardiovascular: Negative for chest pain and palpitations.  Gastrointestinal: Negative for abdominal distention, abdominal pain and constipation.  Endocrine: Negative for polydipsia.  Genitourinary: Negative for dysuria and frequency.  Musculoskeletal: Negative for arthralgias and back pain.  Skin: Negative for rash.  Neurological: Negative for tremors, light-headedness and numbness.  Hematological: Does not bruise/bleed easily.  Psychiatric/Behavioral: Negative for agitation and behavioral problems.    Objective:  BP (!) 167/89   Pulse 63   Temp 98.2 F (36.8 C) (Oral)   Ht 5\' 6"  (1.676 m)   Wt 261 lb 12.8 oz (118.8 kg)   SpO2 98%   BMI 42.26 kg/m   BP/Weight 03/15/2018 01/19/2018 1/95/0932  Systolic BP 671 245 809  Diastolic BP 89 93 85  Wt. (Lbs)  261.8 261.4 267.6  BMI 42.26 42.19 43.19      Physical Exam  Constitutional: She is oriented to person, place, and time. She appears well-developed and well-nourished. No distress.  HENT:  Head: Normocephalic.  Right Ear: External ear normal.  Left Ear: External ear normal.  Nose: Nose normal.  Mouth/Throat: Oropharynx is clear and moist.  Eyes: Pupils are equal, round, and reactive to light. Conjunctivae and EOM are normal.  Neck: Normal range of motion. No JVD present.  Cardiovascular: Normal rate, regular rhythm, normal heart sounds and intact distal pulses. Exam reveals no gallop.  No murmur heard. Pulmonary/Chest: Effort normal and breath sounds normal. No respiratory distress. She has no wheezes. She has no rales. She exhibits no tenderness. Right breast exhibits no mass and no tenderness. Left breast exhibits no mass and no tenderness.  Abdominal: Soft. Bowel sounds are normal. She exhibits no distension and no mass. There is no tenderness.  Genitourinary:  Genitourinary Comments: External genitalia, vagina, cervix, adnexa-all normal  Musculoskeletal: Normal range of motion. She exhibits no edema or tenderness.  Neurological: She is alert and oriented to person, place, and time. She has normal reflexes.  Skin: Skin is warm and dry. She is not diaphoretic.  Psychiatric: She has a normal mood and affect.     Assessment & Plan:   1. Annual physical exam Counseled on 150 minutes of exercise per week, healthy eating (including decreased daily intake of saturated fats, cholesterol, added sugars, sodium), STI prevention, routine healthcare maintenance.  2. Screening for breast cancer - MM Digital Screening; Future  3. Screening for cervical cancer  Cytology - PAP([facility])  4. Screening for colon cancer - Ambulatory referral to Gastroenterology  5. Essential hypertension Uncontrolled Increased dose of Coreg Counseled on blood pressure goal of less than 130/80,  low-sodium, DASH diet, medication compliance, 150 minutes of moderate intensity exercise per week. Discussed medication compliance, adverse effects. - carvedilol (COREG) 6.25 MG tablet; Take 1 tablet (6.25 mg total) by mouth 2 (two) times daily with a meal.  Dispense: 60 tablet; Refill: 3  6. Chronic pain of right knee - meloxicam (MOBIC) 7.5 MG tablet; Take 1 tablet (7.5 mg total) by mouth daily.  Dispense: 30 tablet; Refill: 3  7. Pedal edema - furosemide (LASIX) 20 MG tablet; Take 1 tablet (20 mg total) by mouth daily.  Dispense: 30 tablet; Refill: 3   Meds ordered this encounter  Medications  . furosemide (LASIX) 20 MG tablet    Sig: Take 1 tablet (20 mg total) by mouth daily.    Dispense:  30 tablet    Refill:  3  . carvedilol (COREG) 6.25 MG tablet    Sig: Take 1 tablet (6.25 mg total) by mouth 2 (two) times daily with a meal.    Dispense:  60 tablet    Refill:  3    Discontinue previous dose  . meloxicam (MOBIC)  7.5 MG tablet    Sig: Take 1 tablet (7.5 mg total) by mouth daily.    Dispense:  30 tablet    Refill:  3    Follow-up: Return in about 3 months (around 06/15/2018) for follow up of chronic medical conditions.   Charlott Rakes MD

## 2018-03-17 LAB — CYTOLOGY - PAP
Diagnosis: NEGATIVE
HPV: NOT DETECTED

## 2018-04-08 ENCOUNTER — Other Ambulatory Visit: Payer: Self-pay | Admitting: Obstetrics and Gynecology

## 2018-04-08 DIAGNOSIS — Z1231 Encounter for screening mammogram for malignant neoplasm of breast: Secondary | ICD-10-CM

## 2018-04-19 ENCOUNTER — Ambulatory Visit: Payer: Self-pay | Attending: Family Medicine

## 2018-04-28 MED FILL — POTASSIUM CL 10 MEQ TAB SA: 10 | 30 days supply | Qty: 30 | Fill #1

## 2018-04-28 MED FILL — ?CARVEDILOL 6.25 MG TABLET: 6.25 | 30 days supply | Qty: 60 | Fill #1

## 2018-04-28 MED FILL — LISINOPRIL-HCTZ 20-12.5 MG: 20-12.5 | 30 days supply | Qty: 60 | Fill #5

## 2018-04-28 MED FILL — MELOXICAM 7.5 MG TABLET: 7.5 | 30 days supply | Qty: 30 | Fill #1

## 2018-04-28 MED FILL — ?FUROSEMIDE 20 MG TABLET: 20 | 30 days supply | Qty: 30 | Fill #1

## 2018-04-28 MED FILL — ?ATORVASTATIN 20 MG TABLET: 20 | 30 days supply | Qty: 30 | Fill #5

## 2018-04-29 MED FILL — ?METFORMIN HCL 500MG TABS: 500 | 30 days supply | Qty: 120 | Fill #0

## 2018-06-06 MED FILL — ?FUROSEMIDE 20 MG TABLET: 20 | 30 days supply | Qty: 30 | Fill #2

## 2018-06-06 MED FILL — MELOXICAM 7.5 MG TABLET: 7.5 | 30 days supply | Qty: 30 | Fill #2

## 2018-06-06 MED FILL — ?METFORMIN HCL 500MG TABS: 500 | 30 days supply | Qty: 120 | Fill #1

## 2018-06-06 MED FILL — ?CARVEDILOL 6.25 MG TABLET: 6.25 | 30 days supply | Qty: 60 | Fill #2

## 2018-06-06 MED FILL — ?ATORVASTATIN 20 MG TABLET: 20 | 30 days supply | Qty: 30 | Fill #0

## 2018-06-06 MED FILL — LISINOPRIL-HCTZ 20-12.5 MG: 20-12.5 | 30 days supply | Qty: 60 | Fill #0

## 2018-06-06 MED FILL — POTASSIUM CL ER 10 MEQ TAB: 10 | 30 days supply | Qty: 30 | Fill #2

## 2018-06-15 ENCOUNTER — Encounter: Payer: Self-pay | Admitting: Family Medicine

## 2018-06-15 ENCOUNTER — Ambulatory Visit: Payer: Self-pay | Attending: Family Medicine | Admitting: Family Medicine

## 2018-06-15 VITALS — BP 151/89 | HR 61 | Temp 98.5°F | Ht 66.0 in | Wt 255.0 lb

## 2018-06-15 DIAGNOSIS — Z7984 Long term (current) use of oral hypoglycemic drugs: Secondary | ICD-10-CM | POA: Insufficient documentation

## 2018-06-15 DIAGNOSIS — I10 Essential (primary) hypertension: Secondary | ICD-10-CM | POA: Insufficient documentation

## 2018-06-15 DIAGNOSIS — Z6841 Body Mass Index (BMI) 40.0 and over, adult: Secondary | ICD-10-CM | POA: Insufficient documentation

## 2018-06-15 DIAGNOSIS — E876 Hypokalemia: Secondary | ICD-10-CM | POA: Insufficient documentation

## 2018-06-15 DIAGNOSIS — R600 Localized edema: Secondary | ICD-10-CM | POA: Insufficient documentation

## 2018-06-15 DIAGNOSIS — J302 Other seasonal allergic rhinitis: Secondary | ICD-10-CM | POA: Insufficient documentation

## 2018-06-15 DIAGNOSIS — M1711 Unilateral primary osteoarthritis, right knee: Secondary | ICD-10-CM | POA: Insufficient documentation

## 2018-06-15 DIAGNOSIS — Z23 Encounter for immunization: Secondary | ICD-10-CM

## 2018-06-15 DIAGNOSIS — J3089 Other allergic rhinitis: Secondary | ICD-10-CM

## 2018-06-15 DIAGNOSIS — Z79899 Other long term (current) drug therapy: Secondary | ICD-10-CM | POA: Insufficient documentation

## 2018-06-15 DIAGNOSIS — E119 Type 2 diabetes mellitus without complications: Secondary | ICD-10-CM | POA: Insufficient documentation

## 2018-06-15 DIAGNOSIS — E7800 Pure hypercholesterolemia, unspecified: Secondary | ICD-10-CM | POA: Insufficient documentation

## 2018-06-15 LAB — POCT GLYCOSYLATED HEMOGLOBIN (HGB A1C): HBA1C, POC (PREDIABETIC RANGE): 5.8 % (ref 5.7–6.4)

## 2018-06-15 MED ORDER — FUROSEMIDE 20 MG PO TABS: 20.0000 mg | ORAL_TABLET | Freq: Every day | ORAL | 6 refills | Status: DC

## 2018-06-15 MED ORDER — MELOXICAM 7.5 MG PO TABS: 7.5000 mg | ORAL_TABLET | Freq: Every day | ORAL | 3 refills | Status: DC

## 2018-06-15 MED ORDER — METFORMIN HCL 500 MG PO TABS: 1000.0000 mg | ORAL_TABLET | Freq: Two times a day (BID) | ORAL | 6 refills | Status: DC

## 2018-06-15 MED ORDER — ATORVASTATIN CALCIUM 20 MG PO TABS: 20.0000 mg | ORAL_TABLET | Freq: Every day | ORAL | 6 refills | Status: DC

## 2018-06-15 MED ORDER — FLUTICASONE PROPIONATE 50 MCG/ACT NA SUSP: 2.0000 | Freq: Every day | NASAL | 6 refills | Status: DC

## 2018-06-15 MED ORDER — POTASSIUM CHLORIDE ER 10 MEQ PO TBCR: 10.0000 meq | EXTENDED_RELEASE_TABLET | Freq: Every day | ORAL | 6 refills | Status: DC

## 2018-06-15 MED ORDER — LISINOPRIL-HYDROCHLOROTHIAZIDE 20-12.5 MG PO TABS: 2.0000 | ORAL_TABLET | Freq: Every day | ORAL | 6 refills | Status: DC

## 2018-06-15 MED ORDER — CARVEDILOL 6.25 MG PO TABS: 6.2500 mg | ORAL_TABLET | Freq: Two times a day (BID) | ORAL | 6 refills | Status: DC

## 2018-06-15 NOTE — Patient Instructions (Signed)

## 2018-06-15 NOTE — Progress Notes (Signed)
Subjective:  Patient ID: Emily Dickson, female    DOB: [birth year]  Age: 62 y.o. MRN: [id number]  CC: Hypertension   HPI Liberta A Zafar is a 62 year old female with a history of hypertension, hyperlipidemia, chronic rhinitis, right knee pain, Type 2 DM (A1c 6.3)  who presents today for follow-up visit. Her A1c is 5.8 which has improved from 6.3 previously and she endorses compliance with metformin, exercise and has cut out starches and sweets.  She also exercises regularly and goes for walks on most days. Her blood pressure is slightly elevated today and she informs me she is here to take carvedilol as she is fasting in anticipation of labs.  Tolerating her statin and denies myalgias or other adverse effects. She has noticed significant improvement in her knee pain ever since she lost weight.  She has lost 6 pounds in the last 3 months and 26 pounds in the last 1 year. She has no additional concerns today.  Past Medical History:  Diagnosis Date  . Back pain   . High cholesterol   . Hypertension     History reviewed. No pertinent surgical history.  Allergies  Allergen Reactions  . Penicillins Rash    Has patient had a PCN reaction causing immediate rash, facial/tongue/throat swelling, SOB or lightheadedness with hypotension: No Has patient had a PCN reaction causing severe rash involving mucus membranes or skin necrosis: No Has patient had a PCN reaction that required hospitalization No Has patient had a PCN reaction occurring within the last 10 years: No If all of the above answers are "NO", then may proceed with Cephalosporin use.     Outpatient Medications Prior to Visit  Medication Sig Dispense Refill  . Blood Glucose Monitoring Suppl (TRUE METRIX METER) DEVI 1 each daily with breakfast by Does not apply route. 1 Device 0  . glucose blood (TRUE METRIX BLOOD GLUCOSE TEST) test strip 1 each by Other route 3 (three) times daily. 100 each 12  . loratadine (CLARITIN) 10 MG  tablet Take 1 tablet (10 mg total) by mouth daily. 30 tablet 5  . Multiple Vitamin (MULTIVITAMIN WITH MINERALS) TABS tablet Take 1 tablet by mouth daily.    . traMADol (ULTRAM) 50 MG tablet Take 1 tablet (50 mg total) every 12 (twelve) hours as needed by mouth. 60 tablet 1  . TRUEPLUS LANCETS 28G MISC 1 each daily with breakfast by Does not apply route. 30 each 12  . atorvastatin (LIPITOR) 20 MG tablet Take 1 tablet (20 mg total) by mouth daily at 6 PM. 30 tablet 6  . carvedilol (COREG) 6.25 MG tablet Take 1 tablet (6.25 mg total) by mouth 2 (two) times daily with a meal. 60 tablet 3  . fluticasone (FLONASE) 50 MCG/ACT nasal spray Place 2 sprays into both nostrils daily. 16 g 6  . furosemide (LASIX) 20 MG tablet Take 1 tablet (20 mg total) by mouth daily. 30 tablet 3  . lisinopril-hydrochlorothiazide (ZESTORETIC) 20-12.5 MG tablet Take 2 tablets by mouth daily. 60 tablet 6  . meloxicam (MOBIC) 7.5 MG tablet Take 1 tablet (7.5 mg total) by mouth daily. 30 tablet 3  . metFORMIN (GLUCOPHAGE) 500 MG tablet Take 2 tablets (1,000 mg total) by mouth 2 (two) times daily with a meal. Start out with 1 tab PO bid for one week 120 tablet 6  . potassium chloride (K-DUR) 10 MEQ tablet Take 1 tablet (10 mEq total) by mouth daily. 30 tablet 6  . Biotin 1000 MCG tablet Take  1,000 mcg by mouth 3 (three) times daily.     No facility-administered medications prior to visit.     ROS Review of Systems  Constitutional: Negative for activity change, appetite change and fatigue.  HENT: Negative for congestion, sinus pressure and sore throat.   Eyes: Negative for visual disturbance.  Respiratory: Negative for cough, chest tightness, shortness of breath and wheezing.   Cardiovascular: Negative for chest pain and palpitations.  Gastrointestinal: Negative for abdominal distention, abdominal pain and constipation.  Endocrine: Negative for polydipsia.  Genitourinary: Negative for dysuria and frequency.  Musculoskeletal:  Negative for arthralgias and back pain.  Skin: Negative for rash.  Neurological: Negative for tremors, light-headedness and numbness.  Hematological: Does not bruise/bleed easily.  Psychiatric/Behavioral: Negative for agitation and behavioral problems.    Objective:  BP (!) 151/89   Pulse 61   Temp 98.5 F (36.9 C) (Oral)   Ht _0  (1.676 m)   Wt 255 lb (115.7 kg)   SpO2 96%   BMI 41.16 kg/m   BP/Weight 06/15/2018 03/15/2018 1/61/0960  Systolic BP 454 098 119  Diastolic BP 89 89 93  Wt. (Lbs) 255 261.8 261.4  BMI 41.16 42.26 42.19      Physical Exam  Constitutional: She is oriented to person, place, and time. She appears well-developed and well-nourished.  Cardiovascular: Normal rate, normal heart sounds and intact distal pulses.  No murmur heard. Pulmonary/Chest: Effort normal and breath sounds normal. She has no wheezes. She has no rales. She exhibits no tenderness.  Abdominal: Soft. Bowel sounds are normal. She exhibits no distension and no mass. There is no tenderness.  Musculoskeletal: Normal range of motion. She exhibits edema (1+ non pitting edema of L ankle).  Neurological: She is alert and oriented to person, place, and time.  Skin: Skin is warm and dry.  Psychiatric: She has a normal mood and affect.     CMP Latest Ref Rng & Units 01/19/2018 10/21/2017 07/21/2017  Glucose 65 - 99 mg/dL 93 99 108(H)  BUN 8 - 27 mg/dL _1 Creatinine 0.57 - 1.00 mg/dL 0.54(L) 0.62 0.57  Sodium 134 - 144 mmol/L 143 142 141  Potassium 3.5 - 5.2 mmol/L 4.0 4.2 4.5  Chloride 96 - 106 mmol/L 101 99 98  CO2 20 - 29 mmol/L 27 26 30(H)  Calcium 8.7 - 10.3 mg/dL 9.7 10.1 9.7  Total Protein 6.0 - 8.5 g/dL 7.0 7.9 7.5  Total Bilirubin 0.0 - 1.2 mg/dL 0.4 0.3 0.4  Alkaline Phos 39 - 117 IU/L 49 69 83  AST 0 - 40 IU/L _2 ALT 0 - 32 IU/L _3 Lipid Panel     Component Value Date/Time   CHOL 126 01/19/2018 1035   TRIG 55 01/19/2018 1035   HDL 46 01/19/2018 1035    CHOLHDL 2.7 01/19/2018 1035   CHOLHDL 2.6 03/11/2016 1043   VLDL 13 03/11/2016 1043   LDLCALC 69 01/19/2018 1035    Lab Results  Component Value Date   HGBA1C 5.8 06/15/2018     Assessment & Plan:   1. Type 2 diabetes mellitus without complication, without long-term current use of insulin (HCC) Controlled with A1c of 5.8 which has improved from 6.3 previously Continue current regimen - CMP14+EGFR - Lipid panel - metFORMIN (GLUCOPHAGE) 500 MG tablet; Take 2 tablets (1,000 mg total) by mouth 2 (two) times daily with a meal.  Dispense: 120 tablet; Refill: 6  2. Primary osteoarthritis of right knee  Improved especially with recent weight loss Advised to continue working on additional weight loss, exercise - meloxicam (MOBIC) 7.5 MG tablet; Take 1 tablet (7.5 mg total) by mouth daily.  Dispense: 30 tablet; Refill: 3  3. Hypokalemia Stable - potassium chloride (K-DUR) 10 MEQ tablet; Take 1 tablet (10 mEq total) by mouth daily.  Dispense: 30 tablet; Refill: 6  4. Essential hypertension Uncontrolled she is yet to take carvedilol No regimen change today Counseled on blood pressure goal of less than 130/80, low-sodium, DASH diet, medication compliance, 150 minutes of moderate intensity exercise per week. Discussed medication compliance, adverse effects. - lisinopril-hydrochlorothiazide (ZESTORETIC) 20-12.5 MG tablet; Take 2 tablets by mouth daily.  Dispense: 60 tablet; Refill: 6 - carvedilol (COREG) 6.25 MG tablet; Take 1 tablet (6.25 mg total) by mouth 2 (two) times daily with a meal.  Dispense: 60 tablet; Refill: 6  5. Pedal edema Dependent edema Use compression stockings - furosemide (LASIX) 20 MG tablet; Take 1 tablet (20 mg total) by mouth daily.  Dispense: 30 tablet; Refill: 6  6. Seasonal allergic rhinitis due to other allergic trigger Stable - fluticasone (FLONASE) 50 MCG/ACT nasal spray; Place 2 sprays into both nostrils daily.  Dispense: 16 g; Refill: 6  7. Pure  hypercholesterolemia Controlled Low-cholesterol diet - atorvastatin (LIPITOR) 20 MG tablet; Take 1 tablet (20 mg total) by mouth daily at 6 PM.  Dispense: 30 tablet; Refill: 6  8. Class 3 severe obesity due to excess calories without serious comorbidity with body mass index (BMI) of 40.0 to 44.9 in adult [facility]) She has lost 6 lbs in the last 3 months and has been commended Continue exercising, reducing portion size   Meds ordered this encounter  Medications  . meloxicam (MOBIC) 7.5 MG tablet    Sig: Take 1 tablet (7.5 mg total) by mouth daily.    Dispense:  30 tablet    Refill:  3  . potassium chloride (K-DUR) 10 MEQ tablet    Sig: Take 1 tablet (10 mEq total) by mouth daily.    Dispense:  30 tablet    Refill:  6  . metFORMIN (GLUCOPHAGE) 500 MG tablet    Sig: Take 2 tablets (1,000 mg total) by mouth 2 (two) times daily with a meal.    Dispense:  120 tablet    Refill:  6  . lisinopril-hydrochlorothiazide (ZESTORETIC) 20-12.5 MG tablet    Sig: Take 2 tablets by mouth daily.    Dispense:  60 tablet    Refill:  6  . furosemide (LASIX) 20 MG tablet    Sig: Take 1 tablet (20 mg total) by mouth daily.    Dispense:  30 tablet    Refill:  6  . fluticasone (FLONASE) 50 MCG/ACT nasal spray    Sig: Place 2 sprays into both nostrils daily.    Dispense:  16 g    Refill:  6  . carvedilol (COREG) 6.25 MG tablet    Sig: Take 1 tablet (6.25 mg total) by mouth 2 (two) times daily with a meal.    Dispense:  60 tablet    Refill:  6  . atorvastatin (LIPITOR) 20 MG tablet    Sig: Take 1 tablet (20 mg total) by mouth daily at 6 PM.    Dispense:  30 tablet    Refill:  6    Follow-up: Return in about 6 months (around 12/15/2018) for follow up of chronic medical conditions.   Charlott Rakes MD

## 2018-06-16 LAB — CMP14+EGFR
ALBUMIN: 4.2 g/dL (ref 3.6–4.8)
ALK PHOS: 52 IU/L (ref 39–117)
ALT: 14 IU/L (ref 0–32)
AST: 15 IU/L (ref 0–40)
Albumin/Globulin Ratio: 1.4 (ref 1.2–2.2)
BUN / CREAT RATIO: 25 (ref 12–28)
BUN: 18 mg/dL (ref 8–27)
Bilirubin Total: 0.3 mg/dL (ref 0.0–1.2)
CALCIUM: 10.2 mg/dL (ref 8.7–10.3)
CO2: 30 mmol/L — AB (ref 20–29)
CREATININE: 0.71 mg/dL (ref 0.57–1.00)
Chloride: 96 mmol/L (ref 96–106)
GFR calc Af Amer: 107 mL/min/{1.73_m2} (ref >59)
GFR, EST NON AFRICAN AMERICAN: 93 mL/min/{1.73_m2} (ref >59)
GLUCOSE: 111 mg/dL — AB (ref 65–99)
Globulin, Total: 3 g/dL (ref 1.5–4.5)
Potassium: 3.5 mmol/L (ref 3.5–5.2)
Sodium: 143 mmol/L (ref 134–144)
Total Protein: 7.2 g/dL (ref 6.0–8.5)

## 2018-06-16 LAB — LIPID PANEL
CHOL/HDL RATIO: 2.8 ratio (ref 0.0–4.4)
CHOLESTEROL TOTAL: 177 mg/dL (ref 100–199)
HDL: 63 mg/dL (ref >39)
LDL CALC: 101 mg/dL — AB (ref 0–99)
TRIGLYCERIDES: 66 mg/dL (ref 0–149)
VLDL CHOLESTEROL CAL: 13 mg/dL (ref 5–40)

## 2018-06-17 ENCOUNTER — Telehealth: Payer: Self-pay

## 2018-06-17 NOTE — Telephone Encounter (Signed)
-----   Message from Charlott Rakes, MD sent at 06/16/2018 11:08 AM EDT ----- Please inform the patient that labs are normal. Thank you.

## 2018-06-17 NOTE — Telephone Encounter (Signed)
Patient was called and informed of normal lab results. 

## 2018-06-23 ENCOUNTER — Ambulatory Visit
Admission: RE | Admit: 2018-06-23 | Discharge: 2018-06-23 | Disposition: A | Discharge status: Home / Self Care | Payer: Self-pay | Source: Ambulatory Visit | Attending: Obstetrics and Gynecology | Admitting: Obstetrics and Gynecology

## 2018-06-23 ENCOUNTER — Ambulatory Visit (HOSPITAL_COMMUNITY)
Admission: RE | Admit: 2018-06-23 | Discharge: 2018-06-23 | Disposition: A | Discharge status: Home / Self Care | Payer: No Typology Code available for payment source | Source: Ambulatory Visit | Attending: Obstetrics and Gynecology | Admitting: Obstetrics and Gynecology

## 2018-06-23 ENCOUNTER — Encounter (HOSPITAL_COMMUNITY): Payer: Self-pay

## 2018-06-23 VITALS — BP 152/100 | Ht 67.0 in | Wt 257.0 lb

## 2018-06-23 DIAGNOSIS — Z1231 Encounter for screening mammogram for malignant neoplasm of breast: Secondary | ICD-10-CM

## 2018-06-23 DIAGNOSIS — Z1239 Encounter for other screening for malignant neoplasm of breast: Secondary | ICD-10-CM

## 2018-06-23 HISTORY — DX: Type 2 diabetes mellitus without complications: E11.9

## 2018-06-23 NOTE — Progress Notes (Signed)
No complaints today.   Pap Smear: Pap smear not completed today. Last Pap smear was 03/15/2018 at [facility] and normal with negative HPV. Per patient has no history of an abnormal Pap smear. Last Pap smear result is in Epic.  Physical exam: Breasts Breasts symmetrical. No skin abnormalities bilateral breasts. No nipple retraction bilateral breasts. No nipple discharge bilateral breasts. No lymphadenopathy. No lumps palpated bilateral breasts. No complaints of pain or tenderness on exam. Referred patient to the [facility] for a screening mammogram. Appointment scheduled for Thursday, June 23, 2018 at 0910.        Pelvic/Bimanual No Pap smear completed today since last Pap smear and HPV typing was 03/15/2018. Pap smear not indicated per B[facility]CP guidelines.   Smoking History: Patient has never smoked.  Patient Navigation: Patient education provided. Access to services provided for patient through [facility].   Colorectal Cancer Screening: Per patient has never had a colonoscopy completed. FIT Test completed 04/28/2017 and negative. No complaints today. FIT Test given to patient to complete and return to [facility]CP.  Breast and Cervical Cancer Risk Assessment: Patient has no family history of breast cancer, known genetic mutations, or radiation treatment to the chest before age 61. Patient has no history of cervical dysplasia, immunocompromised, or DES exposure in-utero.  Risk Assessment    Risk Scores      06/23/2018   Last edited by: Armond Hang, LPN   5-year risk: 1.1 %   Lifetime risk: 5.3 %

## 2018-06-23 NOTE — Patient Instructions (Signed)
Explained breast self awareness with Emily Dickson. Patient did not need a Pap smear today due to last Pap smear and HPV typing was 03/15/2018. Let her know [facility]CP will cover Pap smears and HPV typing every 5 years unless has a history of abnormal Pap smears. Referred patient to the [name] for a screening mammogram. Appointment scheduled for Thursday, June 23, 2018 at 0910. Patient aware of appointment and will be there. Let [facility] know [facility] will [facility] up with her within the next couple weeks with results of mammogram by letter or phone. [name] verbalized understanding.  Whisper Kurka, Arvil Chaco, RN 8:13 AM

## 2018-06-27 ENCOUNTER — Other Ambulatory Visit: Payer: Self-pay

## 2018-07-03 LAB — FECAL OCCULT BLOOD, IMMUNOCHEMICAL: Fecal Occult Bld: NEGATIVE

## 2018-07-04 ENCOUNTER — Encounter (HOSPITAL_COMMUNITY): Payer: Self-pay | Admitting: *Deleted

## 2018-07-04 NOTE — Progress Notes (Signed)
Letter mailed to patient with negative Fit Test results.  

## 2018-07-06 ENCOUNTER — Encounter (HOSPITAL_COMMUNITY): Payer: Self-pay | Admitting: *Deleted

## 2018-07-14 MED FILL — LISINOPRIL-HCTZ 20-12.5 MG: 20-12.5 | 30 days supply | Qty: 60 | Fill #1

## 2018-07-14 MED FILL — ?METFORMIN HCL 500MG TABL: 500 | 30 days supply | Qty: 120 | Fill #2

## 2018-07-14 MED FILL — ?ATORVASTATIN 20 MG TABLET: 20 | 30 days supply | Qty: 30 | Fill #1

## 2018-07-14 MED FILL — POTASSIUM CHLORIDE ER 10 ME: 10 | 30 days supply | Qty: 30 | Fill #0

## 2018-07-14 MED FILL — ?FUROSEMIDE 20 MG TABLET: 20 | 30 days supply | Qty: 30 | Fill #3

## 2018-07-14 MED FILL — ?CARVEDILOL 6.25 MG TABLET: 6.25 | 30 days supply | Qty: 60 | Fill #3

## 2018-07-14 MED FILL — MELOXICAM 7.5 MG TABLET: 7.5 | 30 days supply | Qty: 30 | Fill #3

## 2018-08-15 ENCOUNTER — Other Ambulatory Visit: Payer: Self-pay | Admitting: Family Medicine

## 2018-08-15 DIAGNOSIS — R600 Localized edema: Secondary | ICD-10-CM

## 2018-08-15 MED FILL — LISINOPRIL-HCTZ 20-12.5 MG: 20-12.5 | 30 days supply | Qty: 60 | Fill #2

## 2018-08-15 MED FILL — ?FUROSEMIDE 20 MG TABLET: 20 | 30 days supply | Qty: 30 | Fill #0

## 2018-08-15 MED FILL — ?ATORVASTATIN 20 MG TABLET: 20 | 30 days supply | Qty: 30 | Fill #2

## 2018-08-15 MED FILL — POTASSIUM CHLORIDE ER 10 ME: 10 | 30 days supply | Qty: 30 | Fill #1

## 2018-08-15 MED FILL — MELOXICAM 7.5 MG TABLET: 7.5 | 30 days supply | Qty: 30 | Fill #1

## 2018-08-15 MED FILL — ?METFORMIN HCL 500MG TABL: 500 | 30 days supply | Qty: 120 | Fill #3

## 2018-09-15 MED FILL — LISINOPRIL-HCTZ 20-12.5 MG: 20-12.5 | 30 days supply | Qty: 60 | Fill #3

## 2018-09-15 MED FILL — ?FUROSEMIDE 20 MG TABLET: 20 | 30 days supply | Qty: 30 | Fill #1

## 2018-09-15 MED FILL — ?METFORMIN HCL 500MG TABLET: 500 | 30 days supply | Qty: 120 | Fill #4

## 2018-09-15 MED FILL — POTASSIUM CHLORIDE ER 10 ME: 10 | 30 days supply | Qty: 30 | Fill #2

## 2018-09-15 MED FILL — ?ATORVASTATIN 20 MG TABLET: 20 | 30 days supply | Qty: 30 | Fill #3

## 2018-09-15 MED FILL — ?CARVEDILOL 6.25 MG TABLET: 6.25 | 30 days supply | Qty: 60 | Fill #0

## 2018-10-27 MED FILL — ?FUROSEMIDE 20 MG TABLET: 20 | 30 days supply | Qty: 30 | Fill #2

## 2018-10-27 MED FILL — ?CARVEDILOL 6.25 MG TABLET: 6.25 | 30 days supply | Qty: 60 | Fill #1

## 2018-10-27 MED FILL — MELOXICAM 7.5 MG TABLET: 7.5 | 30 days supply | Qty: 30 | Fill #0

## 2018-10-27 MED FILL — POTASSIUM CHLORIDE ER 10 ME: 10 | 30 days supply | Qty: 30 | Fill #3

## 2018-10-27 MED FILL — ?ATORVASTATIN 20 MG TABLET: 20 | 30 days supply | Qty: 30 | Fill #4

## 2018-10-27 MED FILL — ?METFORMIN HCL 500MG TABL: 500 | 30 days supply | Qty: 120 | Fill #5

## 2018-10-27 MED FILL — LISINOPRIL-HCTZ 20-12.5 MG: 20-12.5 | 30 days supply | Qty: 60 | Fill #4

## 2018-11-17 ENCOUNTER — Ambulatory Visit: Payer: Self-pay | Admitting: Family Medicine

## 2018-11-24 MED FILL — ?FUROSEMIDE 20 MG TABLET: 20 | 30 days supply | Qty: 30 | Fill #3

## 2018-11-24 MED FILL — MELOXICAM 7.5 MG TABLET: 7.5 | 30 days supply | Qty: 30 | Fill #1

## 2018-11-24 MED FILL — ?ATORVASTATIN 20 MG TABLET: 20 | 30 days supply | Qty: 30 | Fill #5

## 2018-11-24 MED FILL — ?CARVEDILOL 6.25 MG TABLET: 6.25 | 30 days supply | Qty: 60 | Fill #2

## 2018-11-24 MED FILL — POTASSIUM CHLORIDE ER 10 ME: 10 | 30 days supply | Qty: 30 | Fill #4

## 2018-11-24 MED FILL — LISINOPRIL-HCTZ 20-12.5 MG: 20-12.5 | 30 days supply | Qty: 60 | Fill #5

## 2018-12-22 ENCOUNTER — Encounter: Payer: Self-pay | Admitting: Family Medicine

## 2018-12-22 ENCOUNTER — Other Ambulatory Visit: Payer: Self-pay

## 2018-12-22 ENCOUNTER — Ambulatory Visit: Payer: Self-pay | Attending: Family Medicine | Admitting: Family Medicine

## 2018-12-22 DIAGNOSIS — M79602 Pain in left arm: Secondary | ICD-10-CM

## 2018-12-22 DIAGNOSIS — M1711 Unilateral primary osteoarthritis, right knee: Secondary | ICD-10-CM

## 2018-12-22 DIAGNOSIS — E7800 Pure hypercholesterolemia, unspecified: Secondary | ICD-10-CM

## 2018-12-22 DIAGNOSIS — R600 Localized edema: Secondary | ICD-10-CM

## 2018-12-22 DIAGNOSIS — I10 Essential (primary) hypertension: Secondary | ICD-10-CM

## 2018-12-22 DIAGNOSIS — E119 Type 2 diabetes mellitus without complications: Secondary | ICD-10-CM

## 2018-12-22 DIAGNOSIS — E876 Hypokalemia: Secondary | ICD-10-CM

## 2018-12-22 MED ORDER — POTASSIUM CHLORIDE ER 10 MEQ PO TBCR: 10.0000 meq | EXTENDED_RELEASE_TABLET | Freq: Every day | ORAL | 1 refills | Status: DC

## 2018-12-22 MED ORDER — ATORVASTATIN CALCIUM 20 MG PO TABS: 20.0000 mg | ORAL_TABLET | Freq: Every day | ORAL | 1 refills | Status: DC

## 2018-12-22 MED ORDER — CARVEDILOL 6.25 MG PO TABS: 6.2500 mg | ORAL_TABLET | Freq: Two times a day (BID) | ORAL | 1 refills | Status: DC

## 2018-12-22 MED ORDER — MELOXICAM 7.5 MG PO TABS: 7.5000 mg | ORAL_TABLET | Freq: Every day | ORAL | 3 refills | Status: DC

## 2018-12-22 MED ORDER — CYCLOBENZAPRINE HCL 10 MG PO TABS: 10.0000 mg | ORAL_TABLET | Freq: Every day | ORAL | 1 refills | Status: DC

## 2018-12-22 MED ORDER — FUROSEMIDE 20 MG PO TABS: 20.0000 mg | ORAL_TABLET | Freq: Every day | ORAL | 1 refills | Status: DC

## 2018-12-22 MED ORDER — METFORMIN HCL 500 MG PO TABS: 1000.0000 mg | ORAL_TABLET | Freq: Two times a day (BID) | ORAL | 1 refills | Status: DC

## 2018-12-22 MED ORDER — LISINOPRIL-HYDROCHLOROTHIAZIDE 20-12.5 MG PO TABS: 2.0000 | ORAL_TABLET | Freq: Every day | ORAL | 1 refills | Status: DC

## 2018-12-22 MED FILL — MELOXICAM 7.5 MG TABLET: 7.5 | 30 days supply | Qty: 30 | Fill #2

## 2018-12-22 MED FILL — LISINOPRIL-HCTZ 20-12.5 MG: 20-12.5 | 30 days supply | Qty: 60 | Fill #6

## 2018-12-22 MED FILL — ?ATORVASTATIN 20 MG TABLET: 20 | 30 days supply | Qty: 30 | Fill #6

## 2018-12-22 MED FILL — ?CARVEDILOL 6.25 MG TABLET: 6.25 | 30 days supply | Qty: 60 | Fill #3

## 2018-12-22 MED FILL — CYCLOBENZAPRINE 10 MG TAB: 10 | 30 days supply | Qty: 30 | Fill #0

## 2018-12-22 MED FILL — ?METFORMIN HCL 500MG TABLET: 500 | 30 days supply | Qty: 120 | Fill #6

## 2018-12-22 MED FILL — POTASSIUM CHLORIDE ER 10 ME: 10 | 30 days supply | Qty: 30 | Fill #5

## 2018-12-22 MED FILL — ?FUROSEMIDE 20 MG TABLET: 20 | 30 days supply | Qty: 30 | Fill #4

## 2018-12-22 NOTE — Progress Notes (Signed)
Virtual Visit via Telephone Note  I connected with Emily Dickson on 12/22/18 at  8:50 AM EDT by telephone and verified that I am speaking with the correct person using two identifiers.   I discussed the limitations, risks, security and privacy concerns of performing an evaluation and management service by telephone and the availability of in person appointments. I also discussed with the patient that there may be a patient responsible charge related to this service. The patient expressed understanding and agreed to proceed.   History of Present Illness: Emily Dickson is a 63 year old female with a history of hypertension, hyperlipidemia, chronic rhinitis, right knee pain, Type 2 DM (A1c 5.8)  who presents today for follow-up visit. She reports doing well and informs me her blood pressure this morning was 149/89 with a pulse of 62 taking her right arm and 155/94 with a pulse of 60 taken in her left arm.  She is yet to take her carvedilol today. Her blood sugar this morning was 120.  She denies hypoglycemia.  She endorses right arm pain which extends from her right shoulder to her right hand and is worse at night, described as sharp and moderate with no associated tingling and she denies dropping things from her right arm.  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 Her other medical conditions are stable. Observations/Objective: Awake, alert, oriented x3 Not in acute distress  CMP Latest Ref Rng & Units 06/15/2018 01/19/2018 10/21/2017  Glucose 65 - 99 mg/dL 111(H) 93 99  BUN 8 - 27 mg/dL 18 12 10   Creatinine 0.57 - 1.00 mg/dL 0.71 0.54(L) 0.62  Sodium 134 - 144 mmol/L 143 143 142  Potassium 3.5 - 5.2 mmol/L 3.5 4.0 4.2  Chloride 96 - 106 mmol/L 96 101 99  CO2 20 - 29 mmol/L 30(H) 27 26  Calcium 8.7 - 10.3 mg/dL 10.2 9.7 10.1  Total Protein 6.0 - 8.5 g/dL 7.2 7.0 7.9  Total Bilirubin 0.0 - 1.2 mg/dL 0.3 0.4 0.3  Alkaline Phos 39 - 117 IU/L 52 49 69   AST 0 - 40 IU/L 15 15 11   ALT 0 - 32 IU/L 14 14 10     Lipid Panel     Component Value Date/Time   CHOL 177 06/15/2018 1133   TRIG 66 06/15/2018 1133   HDL 63 06/15/2018 1133   CHOLHDL 2.8 06/15/2018 1133   CHOLHDL 2.6 03/11/2016 1043   VLDL 13 03/11/2016 1043   LDLCALC 101 (H) 06/15/2018 1133    Lab Results  Component Value Date   HGBA1C 5.8 06/15/2018    Assessment and Plan: 1. Essential hypertension Uncontrolled as per patient report She is yet to take carvedilol and has been advised to do so Counseled on blood pressure goal of less than 130/80, low-sodium, DASH diet, medication compliance, 150 minutes of moderate intensity exercise per week. Discussed medication compliance, adverse effects. - lisinopril-hydrochlorothiazide (ZESTORETIC) 20-12.5 MG tablet; Take 2 tablets by mouth daily.  Dispense: 180 tablet; Refill: 1 - carvedilol (COREG) 6.25 MG tablet; Take 1 tablet (6.25 mg total) by mouth 2 (two) times daily with a meal.  Dispense: 180 tablet; Refill: 1  2. Pure hypercholesterolemia Controlled Low-cholesterol diet - atorvastatin (LIPITOR) 20 MG tablet; Take 1 tablet (20 mg total) by mouth daily at 6 PM.  Dispense: 90 tablet; Refill: 1  3. Pedal edema Elevate feet, use compression stockings - furosemide (LASIX) 20 MG tablet; Take 1 tablet (20 mg total) by mouth  daily.  Dispense: 90 tablet; Refill: 1  4. Type 2 diabetes mellitus without complication, without long-term current use of insulin ([place]) Controlled with last A1c of 5.8 in 06/2018 A1c is due at next visit Counseled on Diabetic diet, my plate method, 546 minutes of moderate intensity exercise/week Keep blood sugar logs with fasting goals of 80-120 mg/dl, random of less than 180 and in the event of sugars less than 60 mg/dl or greater than 400 mg/dl please notify the clinic ASAP. It is recommended that you undergo annual eye exams and annual foot exams. Pneumonia vaccine is recommended. - metFORMIN  (GLUCOPHAGE) 500 MG tablet; Take 2 tablets (1,000 mg total) by mouth 2 (two) times daily with a meal.  Dispense: 360 tablet; Refill: 1  5. Hypokalemia Controlled last potassium was 3.5 - potassium chloride (K-DUR) 10 MEQ tablet; Take 1 tablet (10 mEq total) by mouth daily.  Dispense: 90 tablet; Refill: 1  6. Primary osteoarthritis of right knee Stable - meloxicam (MOBIC) 7.5 MG tablet; Take 1 tablet (7.5 mg total) by mouth daily.  Dispense: 30 tablet; Refill: 3  7. Left arm pain Sounds like shoulder impingement Flexeril added to regimen   Follow Up Instructions: 3 months    I discussed the assessment and treatment plan with the patient. The patient was provided an opportunity to ask questions and all were answered. The patient agreed with the plan and demonstrated an understanding of the instructions.   The patient was advised to call back or seek an in-person evaluation if the symptoms worsen or if the condition fails to improve as anticipated.  I provided 20 minutes total of non-face-to-face time during this encounter including time involved in reviewing previous labs, previous notes and medications.   Charlott Rakes, MD

## 2018-12-22 NOTE — Progress Notes (Signed)
Patient has been called and DOB has been verified. Patient has been screened and transferred to PCP to start phone visit.  C/C: Hypertention  Refills: all medications. 

## 2019-02-07 MED FILL — ?POTASSIUM CL ER 10 MEQ TAB: 10 MEQ | 30 days supply | Qty: 30 | Fill #0

## 2019-02-07 MED FILL — ?ATORVASTATIN 20 MG TABLET: 20 | 30 days supply | Qty: 30 | Fill #0

## 2019-02-07 MED FILL — LISINOPRIL-HCTZ 20-12.5 MG: 20-12.5 | 30 days supply | Qty: 60 | Fill #0

## 2019-02-07 MED FILL — ?CARVEDILOL 6.25 MG TABLET: 6.25 | 30 days supply | Qty: 60 | Fill #4

## 2019-02-07 MED FILL — ?METFORMIN HCL 500MG TABLET: 500 | 30 days supply | Qty: 120 | Fill #0

## 2019-02-07 MED FILL — CYCLOBENZAPRINE 10 MG TAB: 10 | 30 days supply | Qty: 30 | Fill #1

## 2019-02-07 MED FILL — ?FUROSEMIDE 20 MG TABLET: 20 | 30 days supply | Qty: 30 | Fill #5

## 2019-02-07 MED FILL — MELOXICAM 7.5 MG TABLET: 7.5 | 30 days supply | Qty: 30 | Fill #3

## 2019-03-30 ENCOUNTER — Other Ambulatory Visit: Payer: Self-pay | Admitting: Family Medicine

## 2019-03-30 MED FILL — FUROSEMIDE 20 MG TABS: 20 | 30 days supply | Qty: 30 | Fill #6

## 2019-03-30 MED FILL — LISINOPRIL-HCTZ 20-12.5 MG: 20-12.5 | 30 days supply | Qty: 60 | Fill #1

## 2019-03-30 MED FILL — ?METFORMIN HCL 500MG TABLET: 500 | 30 days supply | Qty: 120 | Fill #1

## 2019-03-30 MED FILL — ?CARVEDILOL 6.25 MG TABLET: 6.25 | 30 days supply | Qty: 60 | Fill #5

## 2019-03-30 MED FILL — POTASSIUM CHLORIDE ER 10 ME: 10 | 30 days supply | Qty: 30 | Fill #6

## 2019-03-31 MED FILL — CYCLOBENZAPRINE 10 MG TAB: 10 | 30 days supply | Qty: 30 | Fill #0

## 2019-03-31 MED FILL — MELOXICAM 7.5 MG TABLET: 7.5 | 30 days supply | Qty: 30 | Fill #0

## 2019-04-04 MED FILL — ?ATORVASTATIN 20 MG TABLET: 20 | 30 days supply | Qty: 30 | Fill #1

## 2019-05-01 MED FILL — MELOXICAM 7.5 MG TABLET: 7.5 | 30 days supply | Qty: 30 | Fill #1

## 2019-05-01 MED FILL — ?ATORVASTATIN 20 MG TABLET: 20 | 30 days supply | Qty: 30 | Fill #2

## 2019-05-01 MED FILL — POTASSIUM CHLORIDE ER 10 ME: 10 | 30 days supply | Qty: 30 | Fill #0

## 2019-05-01 MED FILL — LISINOPRIL-HCTZ 20-12.5 MG: 20-12.5 | 30 days supply | Qty: 60 | Fill #2

## 2019-05-01 MED FILL — FUROSEMIDE 20 MG TABS: 20 | 30 days supply | Qty: 30 | Fill #0

## 2019-05-01 MED FILL — ?CARVEDILOL 6.25 MG TABLET: 6.25 | 30 days supply | Qty: 60 | Fill #6

## 2019-05-01 MED FILL — ?METFORMIN HCL 500MG TABLET: 500 | 30 days supply | Qty: 120 | Fill #2

## 2019-06-13 MED FILL — POTASSIUM CHLORIDE ER 10 ME: 10 | 30 days supply | Qty: 30 | Fill #1

## 2019-06-13 MED FILL — FUROSEMIDE 20 MG TABS: 20 | 30 days supply | Qty: 30 | Fill #1

## 2019-06-13 MED FILL — ATORVASTATIN CALCIUM 20 MG: 20 | 30 days supply | Qty: 30 | Fill #3

## 2019-06-13 MED FILL — CARVEDILOL 6.25 MG TABLET: 6.25 | 30 days supply | Qty: 60 | Fill #0

## 2019-06-13 MED FILL — MELOXICAM 7.5 MG TABLET: 7.5 | 30 days supply | Qty: 30 | Fill #2

## 2019-06-13 MED FILL — LISINOPRIL-HCTZ 20-12.5 MG: 20-12.5 | 30 days supply | Qty: 60 | Fill #3

## 2019-06-13 MED FILL — metFORMIN HCL 500 MG TABS: 500 | 30 days supply | Qty: 120 | Fill #3

## 2019-07-10 ENCOUNTER — Other Ambulatory Visit: Payer: Self-pay

## 2019-07-10 ENCOUNTER — Encounter: Payer: Self-pay | Admitting: Family Medicine

## 2019-07-10 ENCOUNTER — Ambulatory Visit: Payer: Self-pay | Attending: Family Medicine | Admitting: Family Medicine

## 2019-07-10 VITALS — BP 161/102 | HR 61 | Temp 98.0°F | Ht 66.0 in | Wt 270.0 lb

## 2019-07-10 DIAGNOSIS — E876 Hypokalemia: Secondary | ICD-10-CM

## 2019-07-10 DIAGNOSIS — R9431 Abnormal electrocardiogram [ECG] [EKG]: Secondary | ICD-10-CM

## 2019-07-10 DIAGNOSIS — R600 Localized edema: Secondary | ICD-10-CM

## 2019-07-10 DIAGNOSIS — E119 Type 2 diabetes mellitus without complications: Secondary | ICD-10-CM

## 2019-07-10 DIAGNOSIS — J3089 Other allergic rhinitis: Secondary | ICD-10-CM

## 2019-07-10 DIAGNOSIS — E7800 Pure hypercholesterolemia, unspecified: Secondary | ICD-10-CM

## 2019-07-10 DIAGNOSIS — R0789 Other chest pain: Secondary | ICD-10-CM

## 2019-07-10 DIAGNOSIS — Z1159 Encounter for screening for other viral diseases: Secondary | ICD-10-CM

## 2019-07-10 DIAGNOSIS — I10 Essential (primary) hypertension: Secondary | ICD-10-CM

## 2019-07-10 LAB — POCT GLYCOSYLATED HEMOGLOBIN (HGB A1C): HbA1c, POC (controlled diabetic range): 5.9 % (ref 0.0–7.0)

## 2019-07-10 LAB — GLUCOSE, POCT (MANUAL RESULT ENTRY): POC Glucose: 100 mg/dl — AB (ref 70–99)

## 2019-07-10 MED ORDER — POTASSIUM CHLORIDE ER 10 MEQ PO TBCR: 10.0000 meq | EXTENDED_RELEASE_TABLET | Freq: Every day | ORAL | 1 refills | Status: DC

## 2019-07-10 MED ORDER — METFORMIN HCL 500 MG PO TABS: 1000.0000 mg | ORAL_TABLET | Freq: Two times a day (BID) | ORAL | 1 refills | Status: DC

## 2019-07-10 MED ORDER — FLUTICASONE PROPIONATE 50 MCG/ACT NA SUSP: 2.0000 | Freq: Every day | NASAL | 6 refills | Status: DC

## 2019-07-10 MED ORDER — LISINOPRIL-HYDROCHLOROTHIAZIDE 20-12.5 MG PO TABS: 2.0000 | ORAL_TABLET | Freq: Every day | ORAL | 1 refills | Status: DC

## 2019-07-10 MED ORDER — CARVEDILOL 3.125 MG PO TABS: 3.1250 mg | ORAL_TABLET | Freq: Two times a day (BID) | ORAL | 1 refills | Status: DC

## 2019-07-10 MED ORDER — CYCLOBENZAPRINE HCL 10 MG PO TABS: 10.0000 mg | ORAL_TABLET | Freq: Every day | ORAL | 1 refills | Status: DC

## 2019-07-10 MED ORDER — ASPIRIN EC 81 MG PO TBEC: 81.0000 mg | DELAYED_RELEASE_TABLET | Freq: Every day | ORAL | 1 refills | Status: AC

## 2019-07-10 MED ORDER — NITROGLYCERIN 0.4 MG SL SUBL: 0.4000 mg | SUBLINGUAL_TABLET | SUBLINGUAL | 3 refills | Status: DC | PRN

## 2019-07-10 MED ORDER — ATORVASTATIN CALCIUM 20 MG PO TABS: 20.0000 mg | ORAL_TABLET | Freq: Every day | ORAL | 1 refills | Status: DC

## 2019-07-10 MED ORDER — FUROSEMIDE 20 MG PO TABS: 20.0000 mg | ORAL_TABLET | Freq: Every day | ORAL | 1 refills | Status: DC

## 2019-07-10 MED FILL — ?CARVEDILOL 3.125 MG TABLET: 3.125 | 30 days supply | Qty: 60 | Fill #0

## 2019-07-10 MED FILL — ?METFORMIN HCL 500MG TABLET: 500 | 30 days supply | Qty: 120 | Fill #4

## 2019-07-10 MED FILL — ?ATORVASTATIN 20 MG TABLET: 20 | 30 days supply | Qty: 30 | Fill #4

## 2019-07-10 MED FILL — FUROSEMIDE 20 MG TABS: 20 | 30 days supply | Qty: 30 | Fill #2

## 2019-07-10 MED FILL — NITROGLYCERIN 0.4 MG TAB SL: 0.4 | 30 days supply | Qty: 30 | Fill #0

## 2019-07-10 MED FILL — MELOXICAM 7.5 MG TABLET: 7.5 | 30 days supply | Qty: 30 | Fill #3

## 2019-07-10 MED FILL — POTASSIUM CHLORIDE ER 10 ME: 10 | 30 days supply | Qty: 30 | Fill #2

## 2019-07-10 MED FILL — LISINOPRIL-HCTZ 20-12.5 MG: 20-12.5 | 30 days supply | Qty: 60 | Fill #4

## 2019-07-10 NOTE — Progress Notes (Signed)
Subjective:  Patient ID: Emily Dickson, female    DOB: 12/05/1955  Age: 63 y.o. MRN: [id number]  CC: Hypertension   HPI Emily Dickson presents is a 63 year old female with a history of hypertension, hyperlipidemia, chronic rhinitis, right knee pain, Type 2 DM (A1c 5.9)  who presents today for follow-up visit. Her blood sugars have been normal with no complaints of hypoglycemia, visual concerns or neuropathy.  Of note in the last 1 year she has gained 15 pounds and endorses not being as active due to the ongoing pandemic. She complains of weakness noticed for about 3 weeks with associated fluctuation of her blood pressures.  Blood pressure readings in the morning prior to her medications would read 150/100 and in the evenings 100/60, 96/50 and in those instances she would skip her evening dose of carvedilol 6.25 mg.  She endorses compliance with her morning dose of carvedilol and lisinopril 40/25.  During that timeframe she had noticed fatigue and weakness but denied GI symptoms, syncopal episodes but reports that over the last 1 week she has felt well and systolic blood pressures in the evening have been in the 120s. She has also noticed intermittent left sided chest pain which would last for few seconds and subside but this is not usually associated with shortness of breath, diaphoresis.  She denies palpitations, anxiety and pain was present with both activity and rest. She is asymptomatic at this time.  Past Medical History:  Diagnosis Date   Back pain    Diabetes mellitus without complication (HCC)    High cholesterol    Hypertension     No past surgical history on file.  No family history on file.  Allergies  Allergen Reactions   Penicillins Rash    Has patient had a PCN reaction causing immediate rash, facial/tongue/throat swelling, SOB or lightheadedness with hypotension: No Has patient had a PCN reaction causing severe rash involving mucus membranes or skin  necrosis: No Has patient had a PCN reaction that required hospitalization No Has patient had a PCN reaction occurring within the last 10 years: No If all of the above answers are "NO", then may proceed with Cephalosporin use.    Outpatient Medications Prior to Visit  Medication Sig Dispense Refill   Biotin 1000 MCG tablet Take 1,000 mcg by mouth 3 (three) times daily.     Blood Glucose Monitoring Suppl (TRUE METRIX METER) DEVI 1 each daily with breakfast by Does not apply route. 1 Device 0   glucose blood (TRUE METRIX BLOOD GLUCOSE TEST) test strip 1 each by Other route 3 (three) times daily. 100 each 12   loratadine (CLARITIN) 10 MG tablet Take 1 tablet (10 mg total) by mouth daily. 30 tablet 5   meloxicam (MOBIC) 7.5 MG tablet Take 1 tablet (7.5 mg total) by mouth daily. 30 tablet 3   Multiple Vitamin (MULTIVITAMIN WITH MINERALS) TABS tablet Take 1 tablet by mouth daily.     TRUEPLUS LANCETS 28G MISC 1 each daily with breakfast by Does not apply route. 30 each 12   atorvastatin (LIPITOR) 20 MG tablet Take 1 tablet (20 mg total) by mouth daily at 6 PM. 90 tablet 1   carvedilol (COREG) 6.25 MG tablet Take 1 tablet (6.25 mg total) by mouth 2 (two) times daily with a meal. 180 tablet 1   cyclobenzaprine (FLEXERIL) 10 MG tablet TAKE 1 TABLET (10 MG TOTAL) BY MOUTH AT BEDTIME. 30 tablet 0   fluticasone (FLONASE) 50 MCG/ACT nasal spray Place  2 sprays into both nostrils daily. 16 g 6   furosemide (LASIX) 20 MG tablet Take 1 tablet (20 mg total) by mouth daily. 90 tablet 1   lisinopril-hydrochlorothiazide (ZESTORETIC) 20-12.5 MG tablet Take 2 tablets by mouth daily. 180 tablet 1   metFORMIN (GLUCOPHAGE) 500 MG tablet Take 2 tablets (1,000 mg total) by mouth 2 (two) times daily with a meal. 360 tablet 1   potassium chloride (K-DUR) 10 MEQ tablet Take 1 tablet (10 mEq total) by mouth daily. 90 tablet 1   traMADol (ULTRAM) 50 MG tablet Take 1 tablet (50 mg total) every 12 (twelve) hours  as needed by mouth. 60 tablet 1   No facility-administered medications prior to visit.      ROS Review of Systems  Constitutional: Negative for activity change, appetite change and fatigue.  HENT: Negative for congestion, sinus pressure and sore throat.   Eyes: Negative for visual disturbance.  Respiratory: Negative for cough, chest tightness, shortness of breath and wheezing.   Cardiovascular: Negative for chest pain and palpitations.  Gastrointestinal: Negative for abdominal distention, abdominal pain and constipation.  Endocrine: Negative for polydipsia.  Genitourinary: Negative for dysuria and frequency.  Musculoskeletal: Negative for arthralgias and back pain.  Skin: Negative for rash.  Neurological: Negative for tremors, light-headedness and numbness.  Hematological: Does not bruise/bleed easily.  Psychiatric/Behavioral: Negative for agitation and behavioral problems.    Objective:  BP (!) 161/102    Pulse 61    Temp 98 F (36.7 C) (Oral)    Ht 5' 6"  (1.676 m)    Wt 270 lb (122.5 kg)    SpO2 96%    BMI 43.58 kg/m   BP/Weight 07/10/2019 06/23/2018 16/0/1093  Systolic BP 235 573 220  Diastolic BP 254 270 89  Wt. (Lbs) 270 257 255  BMI 43.58 40.25 41.16      Physical Exam Constitutional:      Appearance: She is well-developed.  Neck:     Vascular: No JVD.  Cardiovascular:     Rate and Rhythm: Normal rate.     Heart sounds: Normal heart sounds. No murmur.  Pulmonary:     Effort: Pulmonary effort is normal.     Breath sounds: Normal breath sounds. No wheezing or rales.  Chest:     Chest wall: No tenderness.  Abdominal:     General: Bowel sounds are normal. There is no distension.     Palpations: Abdomen is soft. There is no mass.     Tenderness: There is no abdominal tenderness.  Musculoskeletal: Normal range of motion.     Right lower leg: No edema.     Left lower leg: No edema.  Neurological:     Mental Status: She is alert and oriented to person, place,  and time.  Psychiatric:        Mood and Affect: Mood normal.     CMP Latest Ref Rng & Units 06/15/2018 01/19/2018 10/21/2017  Glucose 65 - 99 mg/dL 111(H) 93 99  BUN 8 - 27 mg/dL 18 12 10   Creatinine 0.57 - 1.00 mg/dL 0.71 0.54(L) 0.62  Sodium 134 - 144 mmol/L 143 143 142  Potassium 3.5 - 5.2 mmol/L 3.5 4.0 4.2  Chloride 96 - 106 mmol/L 96 101 99  CO2 20 - 29 mmol/L 30(H) 27 26  Calcium 8.7 - 10.3 mg/dL 10.2 9.7 10.1  Total Protein 6.0 - 8.5 g/dL 7.2 7.0 7.9  Total Bilirubin 0.0 - 1.2 mg/dL 0.3 0.4 0.3  Alkaline Phos 39 -  117 IU/L 52 49 69  AST 0 - 40 IU/L 15 15 11   ALT 0 - 32 IU/L 14 14 10     Lipid Panel     Component Value Date/Time   CHOL 177 06/15/2018 1133   TRIG 66 06/15/2018 1133   HDL 63 06/15/2018 1133   CHOLHDL 2.8 06/15/2018 1133   CHOLHDL 2.6 03/11/2016 1043   VLDL 13 03/11/2016 1043   LDLCALC 101 (H) 06/15/2018 1133    CBC    Component Value Date/Time   WBC 4.2 05/20/2016 1525   RBC 4.42 05/20/2016 1525   HGB 12.2 05/20/2016 1525   HCT 38.0 05/20/2016 1525   PLT 181 05/20/2016 1525   MCV 86.0 05/20/2016 1525   MCH 27.6 05/20/2016 1525   MCHC 32.1 05/20/2016 1525   RDW 18.3 (H) 05/20/2016 1525   LYMPHSABS 1.5 06/18/2015 1815   MONOABS 0.8 06/18/2015 1815   EOSABS 0.1 06/18/2015 1815   BASOSABS 0.0 06/18/2015 1815    Lab Results  Component Value Date   HGBA1C 5.9 07/10/2019    Assessment & Plan:   1. Type 2 diabetes mellitus without complication, without long-term current use of insulin (HCC) Controlled with A1c of 5.9 Continue current regimen Counseled on Diabetic diet, my plate method, 678 minutes of moderate intensity exercise/week Blood sugar logs with fasting goals of 80-120 mg/dl, random of less than 180 and in the event of sugars less than 60 mg/dl or greater than 400 mg/dl encouraged to notify the clinic. Advised on the need for annual eye exams, annual foot exams, Pneumonia vaccine. - POCT glucose (manual entry) - POCT glycosylated  hemoglobin (Hb A1C) - Lipid panel - CMP14+EGFR - Microalbumin/Creatinine Ratio, Urine - metFORMIN (GLUCOPHAGE) 500 MG tablet; Take 2 tablets (1,000 mg total) by mouth 2 (two) times daily with a meal.  Dispense: 360 tablet; Refill: 1  2. Hypokalemia Stable - potassium chloride (KLOR-CON) 10 MEQ tablet; Take 1 tablet (10 mEq total) by mouth daily.  Dispense: 90 tablet; Refill: 1  3. Essential hypertension Uncontrolled She has been having morning elevations in her BP and hypotension in the evenings We will reduce Coreg from 6.25 to 3.125 mg and she has been advised to take this twice daily and keep a log of her blood pressures; she knows to inform the clinic if fluctuations persist - lisinopril-hydrochlorothiazide (ZESTORETIC) 20-12.5 MG tablet; Take 2 tablets by mouth daily.  Dispense: 180 tablet; Refill: 1 - carvedilol (COREG) 3.125 MG tablet; Take 1 tablet (3.125 mg total) by mouth 2 (two) times daily with a meal.  Dispense: 180 tablet; Refill: 1  4. Pedal edema Advised to use this as needed Use compression stockings - furosemide (LASIX) 20 MG tablet; Take 1 tablet (20 mg total) by mouth daily. As needed for leg swelling  Dispense: 90 tablet; Refill: 1  5. Seasonal allergic rhinitis due to other allergic trigger - fluticasone (FLONASE) 50 MCG/ACT nasal spray; Place 2 sprays into both nostrils daily.  Dispense: 16 g; Refill: 6  6. Pure hypercholesterolemia Controlled Low-cholesterol diet - atorvastatin (LIPITOR) 20 MG tablet; Take 1 tablet (20 mg total) by mouth daily at 6 PM.  Dispense: 90 tablet; Refill: 1  7. Need for hepatitis C screening test - Hepatitis c antibody (reflex)  8. ELECTROCARDIOGRAM, ABNORMAL LVH with strain pattern T wave inversions are new compared to previous EKG - ECHOCARDIOGRAM COMPLETE; Future - Ambulatory referral to Cardiology  9. Other chest pain See #8 above Commenced on aspirin and nitroglycerin.   Health  Care Maintenance: Due for eye exam but  lack of medical coverage precludes this Meds ordered this encounter  Medications   cyclobenzaprine (FLEXERIL) 10 MG tablet    Sig: Take 1 tablet (10 mg total) by mouth at bedtime.    Dispense:  30 tablet    Refill:  1   potassium chloride (KLOR-CON) 10 MEQ tablet    Sig: Take 1 tablet (10 mEq total) by mouth daily.    Dispense:  90 tablet    Refill:  1   metFORMIN (GLUCOPHAGE) 500 MG tablet    Sig: Take 2 tablets (1,000 mg total) by mouth 2 (two) times daily with a meal.    Dispense:  360 tablet    Refill:  1   lisinopril-hydrochlorothiazide (ZESTORETIC) 20-12.5 MG tablet    Sig: Take 2 tablets by mouth daily.    Dispense:  180 tablet    Refill:  1   furosemide (LASIX) 20 MG tablet    Sig: Take 1 tablet (20 mg total) by mouth daily. As needed for leg swelling    Dispense:  90 tablet    Refill:  1   fluticasone (FLONASE) 50 MCG/ACT nasal spray    Sig: Place 2 sprays into both nostrils daily.    Dispense:  16 g    Refill:  6   carvedilol (COREG) 3.125 MG tablet    Sig: Take 1 tablet (3.125 mg total) by mouth 2 (two) times daily with a meal.    Dispense:  180 tablet    Refill:  1    Dose decrease   atorvastatin (LIPITOR) 20 MG tablet    Sig: Take 1 tablet (20 mg total) by mouth daily at 6 PM.    Dispense:  90 tablet    Refill:  1   aspirin EC 81 MG tablet    Sig: Take 1 tablet (81 mg total) by mouth daily.    Dispense:  90 tablet    Refill:  1   nitroGLYCERIN (NITROSTAT) 0.4 MG SL tablet    Sig: Place 1 tablet (0.4 mg total) under the tongue every 5 (five) minutes as needed for chest pain.    Dispense:  30 tablet    Refill:  3    Follow-up: Return in about 3 months (around 10/10/2019) for medical conditions - virtual.       Charlott Rakes, MD, FAAFP. [facility] [place], [place]   07/10/2019, 12:56 PM

## 2019-07-11 LAB — CMP14+EGFR
ALT: 12 IU/L (ref 0–32)
AST: 14 IU/L (ref 0–40)
Albumin/Globulin Ratio: 1.7 (ref 1.2–2.2)
Albumin: 4.4 g/dL (ref 3.8–4.8)
Alkaline Phosphatase: 70 IU/L (ref 39–117)
BUN/Creatinine Ratio: 19 (ref 12–28)
BUN: 10 mg/dL (ref 8–27)
Bilirubin Total: 0.4 mg/dL (ref 0.0–1.2)
CO2: 30 mmol/L — ABNORMAL HIGH (ref 20–29)
Calcium: 9.7 mg/dL (ref 8.7–10.3)
Chloride: 99 mmol/L (ref 96–106)
Creatinine, Ser: 0.54 mg/dL — ABNORMAL LOW (ref 0.57–1.00)
GFR calc Af Amer: 118 mL/min/{1.73_m2} (ref >59)
GFR calc non Af Amer: 102 mL/min/{1.73_m2} (ref >59)
Globulin, Total: 2.6 g/dL (ref 1.5–4.5)
Glucose: 91 mg/dL (ref 65–99)
Potassium: 3.9 mmol/L (ref 3.5–5.2)
Sodium: 139 mmol/L (ref 134–144)
Total Protein: 7 g/dL (ref 6.0–8.5)

## 2019-07-11 LAB — HCV COMMENT:

## 2019-07-11 LAB — LIPID PANEL
Chol/HDL Ratio: 2.7 ratio (ref 0.0–4.4)
Cholesterol, Total: 172 mg/dL (ref 100–199)
HDL: 64 mg/dL (ref >39)
LDL Chol Calc (NIH): 97 mg/dL (ref 0–99)
Triglycerides: 58 mg/dL (ref 0–149)
VLDL Cholesterol Cal: 11 mg/dL (ref 5–40)

## 2019-07-11 LAB — HEPATITIS C ANTIBODY (REFLEX): HCV Ab: <0.1 s/co ratio (ref 0.0–0.9)

## 2019-07-12 ENCOUNTER — Other Ambulatory Visit: Payer: Self-pay

## 2019-07-12 ENCOUNTER — Telehealth: Payer: Self-pay

## 2019-07-12 ENCOUNTER — Ambulatory Visit (HOSPITAL_COMMUNITY)
Admission: RE | Admit: 2019-07-12 | Discharge: 2019-07-12 | Disposition: A | Discharge status: Home / Self Care | Payer: Self-pay | Source: Ambulatory Visit | Attending: Family Medicine | Admitting: Family Medicine

## 2019-07-12 DIAGNOSIS — I517 Cardiomegaly: Secondary | ICD-10-CM | POA: Insufficient documentation

## 2019-07-12 DIAGNOSIS — R9431 Abnormal electrocardiogram [ECG] [EKG]: Secondary | ICD-10-CM | POA: Insufficient documentation

## 2019-07-12 DIAGNOSIS — I340 Nonrheumatic mitral (valve) insufficiency: Secondary | ICD-10-CM | POA: Insufficient documentation

## 2019-07-12 NOTE — Telephone Encounter (Signed)
-----   Message from Charlott Rakes, MD sent at 07/12/2019  1:40 PM EST ----- Please inform her her echocardiogram does not reveal presence of heart failure but does reveal slightly enlarged left ventricle which can be due to prolonged hypertension.  Please advised to keep appointment with cardiology but overall things look good.

## 2019-07-12 NOTE — Progress Notes (Signed)
  Echocardiogram 2D Echocardiogram has been performed.  Emily Dickson 07/12/2019, 11:42 AM

## 2019-07-12 NOTE — Telephone Encounter (Signed)
-----   Message from Charlott Rakes, MD sent at 07/11/2019  5:22 PM EST ----- Please inform the patient that labs are normal. Thank you.

## 2019-07-12 NOTE — Telephone Encounter (Signed)
Patient name and DOB has been verified Patient was informed of lab results. Patient had no questions.  

## 2019-07-13 ENCOUNTER — Telehealth: Payer: Self-pay | Admitting: *Deleted

## 2019-07-13 NOTE — Telephone Encounter (Signed)
A message was left, re: her new patient appointment. 

## 2019-07-20 ENCOUNTER — Other Ambulatory Visit (HOSPITAL_COMMUNITY): Payer: Self-pay | Admitting: *Deleted

## 2019-07-20 DIAGNOSIS — Z1231 Encounter for screening mammogram for malignant neoplasm of breast: Secondary | ICD-10-CM

## 2019-07-24 NOTE — Progress Notes (Signed)
Cardiology Office Note:    Date:  07/25/2019   ID:  Emily Dickson, DOB [birth year], MRN [id number]  PCP:  Charlott Rakes, MD  Cardiologist:  No primary care provider on file.  Electrophysiologist:  None   Referring MD: Charlott Rakes, MD   Chief Complaint  Patient presents with  . Chest Pain    History of Present Illness:    Emily Dickson is a 63 y.o. female with a hx of hypertension, hyperlipidemia, type 2 diabetes who is referred by Dr. Margarita Rana for an evaluation of EKG abnormalities and chest pain.  Reports intermittent left-sided chest pain.  States that about 2 months ago she had been recurrence of sharp left-sided chest pain.  Describes as stabbing pain in her chest.  Lasts for 20 seconds or so and resolves.  States that she had episodes for about 1 week and has not noticed it since.  Episodes can occur at rest or with exertion.  States that she walks 30 minutes to 1 hour every day, and has not noticed any exertional chest pain.  Recently decreased carvedilol due to hypotension in evenings.  Takes lasix 20 mg every other day for swelling in legs.  No smoking history.  No family history of heart disease in immediate family.    Saw Dr. Lucia Gaskins in clinic on 07/10/2019 and TTE was ordered.  TTE on 07/12/2019 shows normal LV systolic function, moderate LVH, grade 1 diastolic dysfunction, normal RV function, no significant valvular disease.  Past Medical History:  Diagnosis Date  . Back pain   . Diabetes mellitus without complication ([place])   . High cholesterol   . Hypertension     No past surgical history on file.  Current Medications: No outpatient medications have been marked as taking for the 07/25/19 encounter (Office Visit) with Donato Heinz, MD.     Allergies:   Penicillins   Social History   Socioeconomic History  . Marital status: Single    Spouse name: Not on file  . Number of children: Not on file  . Years of education: Not on file  . Highest  education level: Not on file  Occupational History  . Not on file  Social Needs  . Financial resource strain: Not on file  . Food insecurity    Worry: Not on file    Inability: Not on file  . Transportation needs    Medical: Not on file    Non-medical: Not on file  Tobacco Use  . Smoking status: Never Smoker  . Smokeless tobacco: Never Used  Substance and Sexual Activity  . Alcohol use: No  . Drug use: No  . Sexual activity: Yes    Partners: Male    Birth control/protection: None  Lifestyle  . Physical activity    Days per week: Not on file    Minutes per session: Not on file  . Stress: Not on file  Relationships  . Social Herbalist on phone: Not on file    Gets together: Not on file    Attends religious service: Not on file    Active member of club or organization: Not on file    Attends meetings of clubs or organizations: Not on file    Relationship status: Not on file  Other Topics Concern  . Not on file  Social History Narrative  . Not on file     Family History: No known family history of heart disease  ROS:   Please  see the history of present illness.    All other systems reviewed and are negative.  EKGs/Labs/Other Studies Reviewed:    The following studies were reviewed today:   EKG:  EKG is  ordered today.  The ekg ordered today demonstrates normal sinus rhythm, rate 67, T wave inversions in V3-4  TTE 07/12/19:  1. Left ventricular ejection fraction, by visual estimation, is 55 to 60%. The left ventricle has normal function. There is moderately increased left ventricular hypertrophy.  2. Left ventricular diastolic parameters are consistent with Grade I diastolic dysfunction (impaired relaxation).  3. Global right ventricle has normal systolic function.The right ventricular size is normal. No increase in right ventricular wall thickness.  4. Left atrial size was normal.  5. Right atrial size was normal.  6. The mitral valve is normal in  structure. Mild mitral valve regurgitation. No evidence of mitral stenosis.  7. The tricuspid valve is normal in structure. Tricuspid valve regurgitation is not demonstrated.  8. The aortic valve is normal in structure. Aortic valve regurgitation is not visualized. No evidence of aortic valve sclerosis or stenosis.  9. The pulmonic valve was normal in structure. Pulmonic valve regurgitation is not visualized. 10. The inferior vena cava is normal in size with greater than 50% respiratory variability, suggesting right atrial pressure of 3 mmHg.  Recent Labs: 07/10/2019: ALT 12; BUN 10; Creatinine, Ser 0.54; Potassium 3.9; Sodium 139  Recent Lipid Panel    Component Value Date/Time   CHOL 172 07/10/2019 1206   TRIG 58 07/10/2019 1206   HDL 64 07/10/2019 1206   CHOLHDL 2.7 07/10/2019 1206   CHOLHDL 2.6 03/11/2016 1043   VLDL 13 03/11/2016 1043   LDLCALC 97 07/10/2019 1206    Physical Exam:    VS:  There were no vitals taken for this visit.   BP 136/86, P 67  Wt Readings from Last 3 Encounters:  07/10/19 270 lb (122.5 kg)  06/23/18 257 lb (116.6 kg)  06/15/18 255 lb (115.7 kg)     GEN: Well nourished, well developed in no acute distress HEENT: Normal NECK: No JVD; No carotid bruits LYMPHATICS: No lymphadenopathy CARDIAC: RRR, no murmurs, rubs, gallops RESPIRATORY:  Clear to auscultation without rales, wheezing or rhonchi  ABDOMEN: Soft, non-tender, non-distended MUSCULOSKELETAL:  No edema; No deformity  SKIN: Warm and dry NEUROLOGIC:  Alert and oriented x 3 PSYCHIATRIC:  Normal affect   ASSESSMENT:    1. Chest pain of uncertain etiology   2. Essential hypertension   3. Pure hypercholesterolemia    PLAN:    In order of problems listed above:  Chest pain: Atypical in description, but given risk factors for coronary disease (hypertension, hyperlipidemia, type 2 diabetes) warrants further evaluation for obstructive CAD.  She is able to exercise, will order treadmill EKG   Hypertension: On carvedilol 3.125 mg twice daily and lisinopril-hydrochlorothiazide 40-25 mg.  Recently had to decrease carvedilol dose due to hypotension, will continue current meds  Hyperlipidemia: On atorvastatin 20 mg daily.  Last LDL 97 on 07/10/19.  10-year ASCVD risk 16%.  Will increase atorvastatin to 40 mg daily.  Type 2 diabetes: Well controlled, A1c 5.9.  On Metformin  Lower extremity edema: On Lasix 20 mg daily as needed.  No edema on exam today  RTC in 3 months  Medication Adjustments/Labs and Tests Ordered: Current medicines are reviewed at length with the patient today.  Concerns regarding medicines are outlined above.  Orders Placed This Encounter  Procedures  . Exercise Tolerance Test  .  EKG 12-Lead   Meds ordered this encounter  Medications  . atorvastatin (LIPITOR) 40 MG tablet    Sig: Take 1 tablet (40 mg total) by mouth daily at 6 PM.    Dispense:  90 tablet    Refill:  1    New dose, d/c previous rx    Patient Instructions  Medication Instructions:  INCREASE YOUR ATORVASTATIN TO 40 MG DAILY   HOLD YOUR CARVEDILOL MORNING OF TREADMILL TEST   *If you need a refill on your cardiac medications before your next appointment, please call your pharmacy*  Lab Work: COVID SCREENING 3 [place]  If you have labs (blood work) drawn today and your tests are completely normal, you will receive your results only by: Marland Kitchen MyChart Message (if you have MyChart) OR . A paper copy in the mail If you have any lab test that is abnormal or we need to change your treatment, we will call you to review the results.  Testing/Procedures: Your physician has requested that you have an exercise tolerance test. For further information please visit [email or web address]. Please also follow instruction sheet, as given.  Follow-Up: At [facility], you and your health needs are our priority.  As part of our continuing mission to provide you  with exceptional heart care, we have created designated Provider Care Teams.  These Care Teams include your primary Cardiologist (physician) and Advanced Practice Providers (APPs -  Physician Assistants and Nurse Practitioners) who all work together to provide you with the care you need, when you need it.  Your next appointment:   3 months  The format for your next appointment:   In Person  Provider:   You may see DR Gardiner Rhyme or one of the following Advanced Practice Providers on your designated Care Team:    Rosaria Ferries, PA-C  Jory Sims, DNP, ANP  Cadence Kathlen Mody, NP   Other Instructions   Exercise Stress Test An exercise stress test is a test to check how your heart works during exercise. You will need to walk on a treadmill or ride an exercise bike for this test. An electrocardiogram (ECG) will record your heartbeat when you are at rest and when you are exercising. You may have an ultrasound or nuclear test after the exercise test. The test is done to check for coronary artery disease (CAD). It is also done to:  See how well you can exercise.  Watch for high blood pressure during exercise.  Test how well you can exercise after treatment.  Check the blood flow to your arms and legs. If your test result is not normal, more testing may be needed. What happens before the procedure?  Follow instructions from your doctor about what you cannot eat or drink. ? Do not have any drinks or foods that have caffeine in them for 24 hours before the test, or as told by your doctor. This includes coffee, tea (even decaf tea), sodas, chocolate, and cocoa.  Ask your doctor about changing or stopping your normal medicines. This is important if you: ? Take diabetes medicines. ? Take beta-blocker medicines. ? Wear a nitroglycerin patch.  If you use an inhaler, bring it with you to the test.  Do not put lotions, powders, creams, or oils on your chest before the test.  Wear  comfortable shoes and clothing.  Do not use any products that have nicotine or tobacco in them, such as cigarettes and e-cigarettes. Stop using them at  least 4 hours before the test. If you need help quitting, ask your doctor. What happens during the procedure?   Patches (electrodes) will be put on your chest.  Wires will be connected to the patches. The wires will send signals to a machine to record your heartbeat.  Your heart rate will be watched while you are resting and while you are exercising. Your blood pressure will also be watched during the test.  You will walk on a treadmill or use a stationary bike. If you cannot use these, you may be asked to turn a crank with your hands.  The activity will get harder and will raise your heart rate.  You may be asked to breathe into a tube a few times during the test. This measures the gases that you breathe out.  You will be asked how you are feeling throughout the test.  You will exercise until your heart reaches a target heart rate. You will stop early if: ? You feel dizzy. ? You have chest pain. ? You are out of breath. ? Your blood pressure is too high or too low. ? You have an irregular heartbeat. ? You have pain or aching in your arms or legs. The procedure may vary among doctors and hospitals. What happens after the procedure?  Your blood pressure, heart rate, breathing rate, and blood oxygen level will be watched after the test.  You may return to your normal diet and activities as told by your doctor.  It is up to you to get the results of your test. Ask your doctor, or the department that is doing the test, when your results will be ready. Summary  An exercise stress test is a test to check how your heart works during exercise.  This test is done to check for coronary artery disease.  Your heart rate will be watched while you are resting and while you are exercising.  Follow instructions from your doctor about what  you cannot eat or drink before the test. This information is not intended to replace advice given to you by your health care provider. Make sure you discuss any questions you have with your health care provider. Document Released: 02/10/2008 Document Revised: 12/06/2018 Document Reviewed: 11/24/2016 Elsevier Patient Education  [employer]. 9    Signed, Donato Heinz, MD  07/25/2019 10:09 AM    [place]

## 2019-07-25 ENCOUNTER — Ambulatory Visit (INDEPENDENT_AMBULATORY_CARE_PROVIDER_SITE_OTHER): Payer: Self-pay | Admitting: Cardiology

## 2019-07-25 ENCOUNTER — Other Ambulatory Visit: Payer: Self-pay

## 2019-07-25 ENCOUNTER — Encounter: Payer: Self-pay | Admitting: Cardiology

## 2019-07-25 DIAGNOSIS — I10 Essential (primary) hypertension: Secondary | ICD-10-CM

## 2019-07-25 DIAGNOSIS — R079 Chest pain, unspecified: Secondary | ICD-10-CM

## 2019-07-25 DIAGNOSIS — E7800 Pure hypercholesterolemia, unspecified: Secondary | ICD-10-CM

## 2019-07-25 MED ORDER — ATORVASTATIN CALCIUM 40 MG PO TABS: 40.0000 mg | ORAL_TABLET | Freq: Every day | ORAL | 1 refills | Status: DC

## 2019-07-25 MED FILL — ?ATORVASTATIN 40MG TABLET: 40 | 30 days supply | Qty: 30 | Fill #0

## 2019-07-25 NOTE — Patient Instructions (Addendum)
Medication Instructions:  INCREASE YOUR ATORVASTATIN TO 40 MG DAILY   HOLD YOUR CARVEDILOL MORNING OF TREADMILL TEST   *If you need a refill on your cardiac medications before your next appointment, please call your pharmacy*  Lab Work: COVID SCREENING 3 [place]  If you have labs (blood work) drawn today and your tests are completely normal, you will receive your results only by: Marland Kitchen MyChart Message (if you have MyChart) OR . A paper copy in the mail If you have any lab test that is abnormal or we need to change your treatment, we will call you to review the results.  Testing/Procedures: Your physician has requested that you have an exercise tolerance test. For further information please visit [email or web address]. Please also follow instruction sheet, as given.  Follow-Up: At [facility], you and your health needs are our priority.  As part of our continuing mission to provide you with exceptional heart care, we have created designated Provider Care Teams.  These Care Teams include your primary Cardiologist (physician) and Advanced Practice Providers (APPs -  Physician Assistants and Nurse Practitioners) who all work together to provide you with the care you need, when you need it.  Your next appointment:   3 months  The format for your next appointment:   In Person  Provider:   You may see DR Gardiner Rhyme or one of the following Advanced Practice Providers on your designated Care Team:    Rosaria Ferries, PA-C  Jory Sims, DNP, ANP  Cadence Kathlen Mody, NP   Other Instructions   Exercise Stress Test An exercise stress test is a test to check how your heart works during exercise. You will need to walk on a treadmill or ride an exercise bike for this test. An electrocardiogram (ECG) will record your heartbeat when you are at rest and when you are exercising. You may have an ultrasound or nuclear test after the exercise test. The  test is done to check for coronary artery disease (CAD). It is also done to:  See how well you can exercise.  Watch for high blood pressure during exercise.  Test how well you can exercise after treatment.  Check the blood flow to your arms and legs. If your test result is not normal, more testing may be needed. What happens before the procedure?  Follow instructions from your doctor about what you cannot eat or drink. ? Do not have any drinks or foods that have caffeine in them for 24 hours before the test, or as told by your doctor. This includes coffee, tea (even decaf tea), sodas, chocolate, and cocoa.  Ask your doctor about changing or stopping your normal medicines. This is important if you: ? Take diabetes medicines. ? Take beta-blocker medicines. ? Wear a nitroglycerin patch.  If you use an inhaler, bring it with you to the test.  Do not put lotions, powders, creams, or oils on your chest before the test.  Wear comfortable shoes and clothing.  Do not use any products that have nicotine or tobacco in them, such as cigarettes and e-cigarettes. Stop using them at least 4 hours before the test. If you need help quitting, ask your doctor. What happens during the procedure?   Patches (electrodes) will be put on your chest.  Wires will be connected to the patches. The wires will send signals to a machine to record your heartbeat.  Your heart rate will be watched while you are resting and  while you are exercising. Your blood pressure will also be watched during the test.  You will walk on a treadmill or use a stationary bike. If you cannot use these, you may be asked to turn a crank with your hands.  The activity will get harder and will raise your heart rate.  You may be asked to breathe into a tube a few times during the test. This measures the gases that you breathe out.  You will be asked how you are feeling throughout the test.  You will exercise until your heart  reaches a target heart rate. You will stop early if: ? You feel dizzy. ? You have chest pain. ? You are out of breath. ? Your blood pressure is too high or too low. ? You have an irregular heartbeat. ? You have pain or aching in your arms or legs. The procedure may vary among doctors and hospitals. What happens after the procedure?  Your blood pressure, heart rate, breathing rate, and blood oxygen level will be watched after the test.  You may return to your normal diet and activities as told by your doctor.  It is up to you to get the results of your test. Ask your doctor, or the department that is doing the test, when your results will be ready. Summary  An exercise stress test is a test to check how your heart works during exercise.  This test is done to check for coronary artery disease.  Your heart rate will be watched while you are resting and while you are exercising.  Follow instructions from your doctor about what you cannot eat or drink before the test. This information is not intended to replace advice given to you by your health care provider. Make sure you discuss any questions you have with your health care provider. Document Released: 02/10/2008 Document Revised: 12/06/2018 Document Reviewed: 11/24/2016 Elsevier Patient Education  2020 [employer]. 9

## 2019-08-02 ENCOUNTER — Telehealth: Payer: Self-pay | Admitting: Family Medicine

## 2019-08-02 NOTE — Telephone Encounter (Signed)
Patient came in saying her Cardiologist increased her Lipitor. Patient states that when she has blood work done her labs were normal and does not understand when was her medication increased. Please f/u

## 2019-08-07 NOTE — Telephone Encounter (Signed)
Patient was called and informed to contact heart doctor to be informed of reason for medication change.

## 2019-08-08 ENCOUNTER — Telehealth: Payer: Self-pay | Admitting: Cardiology

## 2019-08-08 NOTE — Telephone Encounter (Signed)
Patient would like to speak to nurse about her atorvastatin (LIPITOR) 40 MG tablet increase.

## 2019-08-08 NOTE — Telephone Encounter (Signed)
Attempted to contact patient back- no answer, LVM with call back number.

## 2019-08-09 NOTE — Telephone Encounter (Signed)
Pt needing clarification on increase of Lipitor Labs reviewed and LDL is 97 Last office visit Dr Gardiner Rhyme increased Lipitor Pt aware of rational and also clarification on getting COVID test and appt for GXT the patient verbalized understanding ./cy

## 2019-08-17 ENCOUNTER — Telehealth (HOSPITAL_COMMUNITY): Payer: Self-pay

## 2019-08-17 NOTE — Telephone Encounter (Signed)
Encounter complete. 

## 2019-08-18 ENCOUNTER — Inpatient Hospital Stay (HOSPITAL_COMMUNITY): Admission: RE | Admit: 2019-08-18 | Payer: Self-pay | Source: Ambulatory Visit

## 2019-08-18 MED FILL — ?METFORMIN HCL 500MG TABLET: 500 | 30 days supply | Qty: 120 | Fill #5

## 2019-08-18 MED FILL — ?CARVEDILOL 3.125 MG TABLET: 3.125 | 30 days supply | Qty: 60 | Fill #1

## 2019-08-18 MED FILL — FUROSEMIDE 20 MG TABS: 20 | 30 days supply | Qty: 30 | Fill #3

## 2019-08-18 MED FILL — LISINOPRIL-HCTZ 20-12.5 MG: 20-12.5 | 30 days supply | Qty: 60 | Fill #5

## 2019-08-18 MED FILL — ?ATORVASTATIN 40MG TABLET: 40 | 30 days supply | Qty: 30 | Fill #1

## 2019-08-22 ENCOUNTER — Inpatient Hospital Stay (HOSPITAL_COMMUNITY): Admission: RE | Admit: 2019-08-22 | Payer: Self-pay | Source: Ambulatory Visit

## 2019-08-25 ENCOUNTER — Other Ambulatory Visit: Payer: Self-pay | Admitting: Family Medicine

## 2019-08-25 ENCOUNTER — Telehealth (HOSPITAL_COMMUNITY): Payer: Self-pay

## 2019-08-25 DIAGNOSIS — Z1231 Encounter for screening mammogram for malignant neoplasm of breast: Secondary | ICD-10-CM

## 2019-08-25 NOTE — Telephone Encounter (Signed)
Encounter complete. 

## 2019-08-26 ENCOUNTER — Other Ambulatory Visit (HOSPITAL_COMMUNITY): Payer: Self-pay

## 2019-08-28 ENCOUNTER — Other Ambulatory Visit (HOSPITAL_COMMUNITY)
Admission: RE | Admit: 2019-08-28 | Discharge: 2019-08-28 | Disposition: A | Discharge status: Home / Self Care | Payer: HRSA Program | Source: Ambulatory Visit | Attending: Diagnostic Radiology | Admitting: Diagnostic Radiology

## 2019-08-28 DIAGNOSIS — U071 COVID-19: Secondary | ICD-10-CM | POA: Insufficient documentation

## 2019-08-28 LAB — SARS CORONAVIRUS 2 (TAT 6-24 HRS): SARS Coronavirus 2: POSITIVE — AB

## 2019-08-29 ENCOUNTER — Telehealth: Payer: Self-pay | Admitting: Cardiology

## 2019-08-29 ENCOUNTER — Telehealth: Payer: Self-pay | Admitting: *Deleted

## 2019-08-29 NOTE — Telephone Encounter (Signed)
Pt has been notified by MD of positive test results.

## 2019-08-29 NOTE — Telephone Encounter (Signed)
Janett Billow from [facility] is calling to inform Dr. Gardiner Rhyme the patient has tested positive for Covid. She states Dr. Gardiner Rhyme will need to be the one to notify the patient & the upcoming test will need to be delayed for 10 days.

## 2019-08-29 NOTE — Telephone Encounter (Signed)
Spoke with Janett Billow, aware we will take care of notifying the patient.

## 2019-08-29 NOTE — Telephone Encounter (Signed)
I have left Emily Dickson two message,re: her stress was cancel for 08/30/19 and to give [place] a call to reschedule.

## 2019-08-30 ENCOUNTER — Ambulatory Visit (HOSPITAL_COMMUNITY): Payer: Self-pay

## 2019-09-06 ENCOUNTER — Ambulatory Visit
Admission: RE | Admit: 2019-09-06 | Discharge: 2019-09-06 | Disposition: A | Discharge status: Home / Self Care | Payer: No Typology Code available for payment source | Source: Ambulatory Visit | Attending: Family Medicine | Admitting: Family Medicine

## 2019-09-06 ENCOUNTER — Other Ambulatory Visit: Payer: Self-pay

## 2019-09-06 DIAGNOSIS — Z1231 Encounter for screening mammogram for malignant neoplasm of breast: Secondary | ICD-10-CM

## 2019-09-07 ENCOUNTER — Telehealth: Payer: Self-pay

## 2019-09-07 NOTE — Telephone Encounter (Signed)
-----   Message from Charlott Rakes, MD sent at 09/06/2019 10:15 AM EST ----- Mammogram is negative for malignancy

## 2019-09-07 NOTE — Telephone Encounter (Signed)
Patient name and DOB has been verified Patient was informed of lab results. Patient had no questions.  

## 2019-09-27 ENCOUNTER — Other Ambulatory Visit: Payer: Self-pay | Admitting: Family Medicine

## 2019-09-27 DIAGNOSIS — M1711 Unilateral primary osteoarthritis, right knee: Secondary | ICD-10-CM

## 2019-09-27 MED FILL — metFORMIN HCL 500 MG TABS: 500 | 30 days supply | Qty: 120 | Fill #0

## 2019-09-27 MED FILL — CARVEDILOL 3.125 MG TABLET: 3.125 | 30 days supply | Qty: 60 | Fill #2

## 2019-09-27 MED FILL — POTASSIUM CHLORIDE ER 10 ME: 10 | 30 days supply | Qty: 30 | Fill #3

## 2019-09-27 MED FILL — LISINOPRIL-HCTZ 20-12.5 MG: 20-12.5 | 30 days supply | Qty: 60 | Fill #0

## 2019-09-27 MED FILL — FUROSEMIDE 20 MG TABS: 20 | 30 days supply | Qty: 30 | Fill #4

## 2019-09-27 MED FILL — ATORVASTATIN CALCIUM 40 MG: 40 | 30 days supply | Qty: 30 | Fill #2

## 2019-09-28 ENCOUNTER — Other Ambulatory Visit: Payer: Self-pay | Admitting: Pharmacist

## 2019-09-28 DIAGNOSIS — E119 Type 2 diabetes mellitus without complications: Secondary | ICD-10-CM

## 2019-09-28 MED ORDER — ONETOUCH VERIO VI STRP: ORAL_STRIP | 12 refills | Status: DC

## 2019-09-28 MED ORDER — ONETOUCH DELICA LANCETS 33G MISC: 11 refills | Status: DC

## 2019-09-28 MED ORDER — ONETOUCH VERIO REFLECT W/DEVICE KIT: 1.0000 | PACK | Freq: Three times a day (TID) | 0 refills | Status: DC

## 2019-09-28 MED FILL — ONETOUCH DELICA PLUS LANCET: 33 days supply | Qty: 100 | Fill #0

## 2019-09-28 MED FILL — ONETOUCH VERIO REFLECT W/DE: W/DEVICE | 30 days supply | Qty: 1 | Fill #0

## 2019-09-28 MED FILL — ONE TOUCH VERIO TEST STRIP: 33 days supply | Qty: 100 | Fill #0

## 2019-09-29 MED FILL — MELOXICAM 7.5 MG TABLET: 7.5 | 30 days supply | Qty: 30 | Fill #0

## 2019-10-10 ENCOUNTER — Encounter: Payer: Self-pay | Admitting: Family Medicine

## 2019-10-10 ENCOUNTER — Other Ambulatory Visit: Payer: Self-pay

## 2019-10-10 ENCOUNTER — Ambulatory Visit: Payer: Self-pay | Attending: Family Medicine | Admitting: Family Medicine

## 2019-10-10 DIAGNOSIS — E119 Type 2 diabetes mellitus without complications: Secondary | ICD-10-CM

## 2019-10-10 DIAGNOSIS — Z1211 Encounter for screening for malignant neoplasm of colon: Secondary | ICD-10-CM

## 2019-10-10 NOTE — Progress Notes (Signed)
Virtual Visit via Telephone Note  I connected with Emily Dickson, on 10/10/2019 at 8:55 AM by telephone due to the COVID-19 pandemic and verified that I am speaking with the correct person using two identifiers.   Consent: I discussed the limitations, risks, security and privacy concerns of performing an evaluation and management service by telephone and the availability of in person appointments. I also discussed with the patient that there may be a patient responsible charge related to this service. The patient expressed understanding and agreed to proceed.   Location of Patient: Home  Location of Provider: Clinic   Persons participating in Telemedicine visit: Ambyr A Lourie Retz -CMA Dr. Margarita Rana     History of Present Illness: Emily Dickson presents is a 64 year old female with a history of hypertension, hyperlipidemia, chronic rhinitis, right knee pain, Type 2 DM (A1c5.9) who presents today for follow-up visit.   Blood sugar was 100 yesterday and she denies hypoglycemia, neuropathy.Eye exam is scheduled for 12/08/19 She is unhappy she received a second glucometer from this pharmacy as a result of obtaining new insurance and was now informed she owed some money.  States she did not need a new meter and was willing to pay out-of-pocket for test strips for her old free glucometer.  Seen by cardiology in 07/2019 due to complaints of chest pain with EKG findings of LVH with strain pattern at last office visit and scheduled for stress test which had to be placed on hold due to her testing positive for COVID-19 but she informs me that 3 days after positive test she subsequently tested negative at an outside facility.  Lipitor dose was also raised from 20 mg to 40 mg at that visit.  She has no additional concerns today. Past Medical History:  Diagnosis Date  . Back pain   . Diabetes mellitus without complication ([place])   . High cholesterol   . Hypertension    Allergies   Allergen Reactions  . Penicillins Rash    Has patient had a PCN reaction causing immediate rash, facial/tongue/throat swelling, SOB or lightheadedness with hypotension: No Has patient had a PCN reaction causing severe rash involving mucus membranes or skin necrosis: No Has patient had a PCN reaction that required hospitalization No Has patient had a PCN reaction occurring within the last 10 years: No If all of the above answers are "NO", then may proceed with Cephalosporin use.    Current Outpatient Medications on File Prior to Visit  Medication Sig Dispense Refill  . Apple Cider Vinegar 500 MG TABS Take 500 mg by mouth one time only at 6 PM.    . aspirin EC 81 MG tablet Take 1 tablet (81 mg total) by mouth daily. 90 tablet 1  . atorvastatin (LIPITOR) 40 MG tablet Take 1 tablet (40 mg total) by mouth daily at 6 PM. 90 tablet 1  . Blood Glucose Monitoring Suppl (ONETOUCH VERIO REFLECT) w/Device KIT 1 kit by Does not apply route 3 (three) times daily. Use as instructed to check blood sugar three times daily. E11.9 1 kit 0  . carvedilol (COREG) 3.125 MG tablet Take 1 tablet (3.125 mg total) by mouth 2 (two) times daily with a meal. 180 tablet 1  . cyclobenzaprine (FLEXERIL) 10 MG tablet Take 1 tablet (10 mg total) by mouth at bedtime. 30 tablet 1  . fluticasone (FLONASE) 50 MCG/ACT nasal spray Place 2 sprays into both nostrils daily. 16 g 6  . furosemide (LASIX) 20 MG tablet Take 1  tablet (20 mg total) by mouth daily. As needed for leg swelling 90 tablet 1  . glucose blood (ONETOUCH VERIO) test strip Use as instructed to check blood sugar three times daily. E11.9 100 each 12  . lisinopril-hydrochlorothiazide (ZESTORETIC) 20-12.5 MG tablet Take 2 tablets by mouth daily. 180 tablet 1  . loratadine (CLARITIN) 10 MG tablet Take 1 tablet (10 mg total) by mouth daily. 30 tablet 5  . meloxicam (MOBIC) 7.5 MG tablet TAKE 1 TABLET (7.5 MG TOTAL) BY MOUTH DAILY. 30 tablet 3  . metFORMIN (GLUCOPHAGE) 500  MG tablet Take 2 tablets (1,000 mg total) by mouth 2 (two) times daily with a meal. 360 tablet 1  . Multiple Vitamin (MULTIVITAMIN WITH MINERALS) TABS tablet Take 1 tablet by mouth daily.    . nitroGLYCERIN (NITROSTAT) 0.4 MG SL tablet Place 1 tablet (0.4 mg total) under the tongue every 5 (five) minutes as needed for chest pain. 30 tablet 3  . OneTouch Delica Lancets 25K MISC Use as instructed to check blood sugar three times daily. E11.9 100 each 11  . potassium chloride (KLOR-CON) 10 MEQ tablet Take 1 tablet (10 mEq total) by mouth daily. 90 tablet 1  . Biotin 1000 MCG tablet Take 1,000 mcg by mouth 3 (three) times daily.     No current facility-administered medications on file prior to visit.    Observations/Objective: Awake, alert, oriented x3 Not in acute distress  Lab Results  Component Value Date   HGBA1C 5.9 07/10/2019    Assessment and Plan: 1. Type 2 diabetes mellitus without complication, without long-term current use of insulin (HCC) Controlled with A1c of 5.9 Continue current regimen Counseled on Diabetic diet, my plate method, 270 minutes of moderate intensity exercise/week Blood sugar logs with fasting goals of 80-120 mg/dl, random of less than 180 and in the event of sugars less than 60 mg/dl or greater than 400 mg/dl encouraged to notify the clinic. Advised on the need for annual eye exams, annual foot exams, Pneumonia vaccine.  2. Screening for colon cancer - Ambulatory referral to Gastroenterology   Follow Up Instructions:   Return in about 6 months (around 04/08/2020).  I discussed the assessment and treatment plan with the patient. The patient was provided an opportunity to ask questions and all were answered. The patient agreed with the plan and demonstrated an understanding of the instructions.   The patient was advised to call back or seek an in-person evaluation if the symptoms worsen or if the condition fails to improve as anticipated.     I provided  12 minutes total of non-face-to-face time during this encounter including median intraservice time, reviewing previous notes, investigations, ordering medications, medical decision making, coordinating care and patient verbalized understanding at the end of the visit.     Charlott Rakes, MD, FAAFP. [facility], [place]   10/10/2019, 8:55 AM

## 2019-10-10 NOTE — Progress Notes (Signed)
Patient verified DOB Patient has not eaten or taken medication today. Patient denies pain at this time CBG: 100 yesterday morning

## 2019-10-18 ENCOUNTER — Telehealth (HOSPITAL_COMMUNITY): Payer: Self-pay

## 2019-10-18 NOTE — Telephone Encounter (Signed)
Encounter complete. 

## 2019-10-23 NOTE — Progress Notes (Signed)
Cardiology Office Note:    Date:  10/24/2019   ID:  HENRIETTE HESSER, DOB [birth year], MRN [id number]  PCP:  Charlott Rakes, MD  Cardiologist:  No primary care provider on file.  Electrophysiologist:  None   Referring MD: Charlott Rakes, MD   Chief Complaint  Patient presents with  . Chest Pain    History of Present Illness:    Emily Dickson is a 64 y.o. female with a hx of hypertension, hyperlipidemia, type 2 diabetes who presents for follow-up of chest pain.  She was initially seen on 07/25/2019.  Reports intermittent left-sided chest pain.  States that about 2 months prior she had recurrence of sharp left-sided chest pain.  Describes as stabbing pain in her chest.  Lasts for 20 seconds or so and resolves.  States that she had episodes for about 1 week and has not noticed it since.  Episodes can occur at rest or with exertion.  States that she walks 30 minutes to 1 hour every day, and has not noticed any exertional chest pain.  Recently decreased carvedilol due to hypotension in evenings.  Takes lasix 20 mg every other day for swelling in legs.  No smoking history.  No family history of heart disease in immediate family.  Saw Dr. Lucia Gaskins in clinic on 07/10/2019 and TTE was ordered.  TTE on 07/12/2019 shows normal LV systolic function, moderate LVH, grade 1 diastolic dysfunction, normal RV function, no significant valvular disease.    At initial clinic visit on 07/25/2019, exercise tolerance test was ordered to evaluate chest pain.  Test was initially scheduled for December but was delayed as she tested positive for COVID-19.  No symptoms.  She reports there is a family was tested and was negative.  She underwent repeat testing after 3 days and was negative.    Underwent a TTE today.  Poor exercise capacity, only did 4.6 METS.  Did reach target heart rate, no evidence of ischemia.  Did have hypertensive response to exercise.  She denies any chest pain since her last clinic visit.  Denies  any dyspnea.  BP elevated in clinic today but she did not take her medications in preparation for stress test today.  Past Medical History:  Diagnosis Date  . Back pain   . Diabetes mellitus without complication ([place])   . High cholesterol   . Hypertension     No past surgical history on file.  Current Medications: Current Meds  Medication Sig  . Apple Cider Vinegar 500 MG TABS Take 500 mg by mouth one time only at 6 PM.  . aspirin EC 81 MG tablet Take 1 tablet (81 mg total) by mouth daily.  Marland Kitchen atorvastatin (LIPITOR) 40 MG tablet Take 1 tablet (40 mg total) by mouth daily at 6 PM.  . Biotin 1000 MCG tablet Take 1,000 mcg by mouth 3 (three) times daily.  . Blood Glucose Monitoring Suppl (ONETOUCH VERIO REFLECT) w/Device KIT 1 kit by Does not apply route 3 (three) times daily. Use as instructed to check blood sugar three times daily. E11.9  . carvedilol (COREG) 3.125 MG tablet Take 1 tablet (3.125 mg total) by mouth 2 (two) times daily with a meal.  . cyclobenzaprine (FLEXERIL) 10 MG tablet Take 1 tablet (10 mg total) by mouth at bedtime.  . fluticasone (FLONASE) 50 MCG/ACT nasal spray Place 2 sprays into both nostrils daily.  . furosemide (LASIX) 20 MG tablet Take 1 tablet (20 mg total) by mouth daily. As needed for leg  swelling  . glucose blood (ONETOUCH VERIO) test strip Use as instructed to check blood sugar three times daily. E11.9  . lisinopril-hydrochlorothiazide (ZESTORETIC) 20-12.5 MG tablet Take 2 tablets by mouth daily.  Marland Kitchen loratadine (CLARITIN) 10 MG tablet Take 1 tablet (10 mg total) by mouth daily.  . meloxicam (MOBIC) 7.5 MG tablet TAKE 1 TABLET (7.5 MG TOTAL) BY MOUTH DAILY.  . metFORMIN (GLUCOPHAGE) 500 MG tablet Take 2 tablets (1,000 mg total) by mouth 2 (two) times daily with a meal.  . Multiple Vitamin (MULTIVITAMIN WITH MINERALS) TABS tablet Take 1 tablet by mouth daily.  . nitroGLYCERIN (NITROSTAT) 0.4 MG SL tablet Place 1 tablet (0.4 mg total) under the tongue every 5  (five) minutes as needed for chest pain.  Glory Rosebush Delica Lancets 32T MISC Use as instructed to check blood sugar three times daily. E11.9  . potassium chloride (KLOR-CON) 10 MEQ tablet Take 1 tablet (10 mEq total) by mouth daily.     Allergies:   Penicillins   Social History   Socioeconomic History  . Marital status: Single    Spouse name: Not on file  . Number of children: Not on file  . Years of education: Not on file  . Highest education level: Not on file  Occupational History  . Not on file  Tobacco Use  . Smoking status: Never Smoker  . Smokeless tobacco: Never Used  Substance and Sexual Activity  . Alcohol use: No  . Drug use: No  . Sexual activity: Yes    Partners: Male    Birth control/protection: None  Other Topics Concern  . Not on file  Social History Narrative  . Not on file   Social Determinants of Health   Financial Resource Strain:   . Difficulty of Paying Living Expenses: Not on file  Food Insecurity:   . Worried About Charity fundraiser in the Last Year: Not on file  . Ran Out of Food in the Last Year: Not on file  Transportation Needs:   . Lack of Transportation (Medical): Not on file  . Lack of Transportation (Non-Medical): Not on file  Physical Activity:   . Days of Exercise per Week: Not on file  . Minutes of Exercise per Session: Not on file  Stress:   . Feeling of Stress : Not on file  Social Connections:   . Frequency of Communication with Friends and Family: Not on file  . Frequency of Social Gatherings with Friends and Family: Not on file  . Attends Religious Services: Not on file  . Active Member of Clubs or Organizations: Not on file  . Attends Archivist Meetings: Not on file  . Marital Status: Not on file     Family History: No known family history of heart disease  ROS:   Please see the history of present illness.    All other systems reviewed and are negative.  EKGs/Labs/Other Studies Reviewed:    The  following studies were reviewed today:   EKG:  EKG is  ordered today.  The ekg ordered today demonstrates normal sinus rhythm, rate 67, T wave inversions in V3-4  TTE 07/12/19:  1. Left ventricular ejection fraction, by visual estimation, is 55 to 60%. The left ventricle has normal function. There is moderately increased left ventricular hypertrophy.  2. Left ventricular diastolic parameters are consistent with Grade I diastolic dysfunction (impaired relaxation).  3. Global right ventricle has normal systolic function.The right ventricular size is normal. No increase in  right ventricular wall thickness.  4. Left atrial size was normal.  5. Right atrial size was normal.  6. The mitral valve is normal in structure. Mild mitral valve regurgitation. No evidence of mitral stenosis.  7. The tricuspid valve is normal in structure. Tricuspid valve regurgitation is not demonstrated.  8. The aortic valve is normal in structure. Aortic valve regurgitation is not visualized. No evidence of aortic valve sclerosis or stenosis.  9. The pulmonic valve was normal in structure. Pulmonic valve regurgitation is not visualized. 10. The inferior vena cava is normal in size with greater than 50% respiratory variability, suggesting right atrial pressure of 3 mmHg.  ETT 10/24/2019:  Blood pressure demonstrated a hypertensive response to exercise.  There was no ST segment deviation noted during stress.  Poor exercise capacity   Negative adequate stress test for ischemia, but poor exercise tolerance, rapid rise in heart rate, and hypertensive response to exercise noted.   Recent Labs: 07/10/2019: ALT 12; BUN 10; Creatinine, Ser 0.54; Potassium 3.9; Sodium 139  Recent Lipid Panel    Component Value Date/Time   CHOL 172 07/10/2019 1206   TRIG 58 07/10/2019 1206   HDL 64 07/10/2019 1206   CHOLHDL 2.7 07/10/2019 1206   CHOLHDL 2.6 03/11/2016 1043   VLDL 13 03/11/2016 1043   LDLCALC 97 07/10/2019 1206     Physical Exam:    VS:  BP (!) 149/92   Pulse 68   Ht 5' 7"  (1.702 m)   Wt 262 lb (118.8 kg)   SpO2 98%   BMI 41.04 kg/m    BP 136/86, P 67  Wt Readings from Last 3 Encounters:  10/24/19 262 lb (118.8 kg)  07/10/19 270 lb (122.5 kg)  06/23/18 257 lb (116.6 kg)     GEN: Well nourished, well developed in no acute distress HEENT: Normal NECK: No JVD; No carotid bruits LYMPHATICS: No lymphadenopathy CARDIAC: RRR, no murmurs, rubs, gallops RESPIRATORY:  Clear to auscultation without rales, wheezing or rhonchi  ABDOMEN: Soft, non-tender, non-distended MUSCULOSKELETAL:  No edema; No deformity  SKIN: Warm and dry NEUROLOGIC:  Alert and oriented x 3 PSYCHIATRIC:  Normal affect   ASSESSMENT:    No diagnosis found. PLAN:    In order of problems listed above:  Chest pain: Atypical in description.  ETT today shows poor exercise capacity and hypertensive response to exercise, but reached target heart rate and no evidence of ischemia  Hypertension: On carvedilol 3.125 mg twice daily and lisinopril-hydrochlorothiazide 40-25 mg.  Will continue current meds  Hyperlipidemia: Last LDL 97 on 07/10/19.  10-year ASCVD risk 16%.  Increase atorvastatin 40 mg daily in 07/2019  Type 2 diabetes: Well controlled, A1c 5.9.  On Metformin  Lower extremity edema: On Lasix 20 mg daily as needed.  No edema on exam today  RTC in 6 months  Medication Adjustments/Labs and Tests Ordered: Current medicines are reviewed at length with the patient today.  Concerns regarding medicines are outlined above.  No orders of the defined types were placed in this encounter.  No orders of the defined types were placed in this encounter.   Patient Instructions  Medications: Your physician recommends that you continue on your current medications as directed. Please refer to the Current Medication list given to you today.  *If you need a refill on your cardiac medications before your next appointment, please  call your pharmacy*  Lab Work: NONE  Testing/Procedures: NONE  Follow-Up: At [facility], you and your health needs are our priority.  As part of our continuing mission to provide you with exceptional heart care, we have created designated Provider Care Teams.  These Care Teams include your primary Cardiologist (physician) and Advanced Practice Providers (APPs -  Physician Assistants and Nurse Practitioners) who all work together to provide you with the care you need, when you need it.  Your next appointment:   6 month(s)  The format for your next appointment:   In Person  Provider:   Oswaldo Milian, MD      Signed, Emily Heinz, MD  10/24/2019 5:28 PM    [place]

## 2019-10-24 ENCOUNTER — Ambulatory Visit (HOSPITAL_COMMUNITY)
Admission: RE | Admit: 2019-10-24 | Discharge: 2019-10-24 | Disposition: A | Discharge status: Home / Self Care | Payer: 59 | Source: Ambulatory Visit | Attending: Cardiovascular Disease | Admitting: Cardiovascular Disease

## 2019-10-24 ENCOUNTER — Other Ambulatory Visit: Payer: Self-pay

## 2019-10-24 ENCOUNTER — Encounter: Payer: Self-pay | Admitting: Cardiology

## 2019-10-24 ENCOUNTER — Ambulatory Visit (INDEPENDENT_AMBULATORY_CARE_PROVIDER_SITE_OTHER): Payer: Self-pay | Admitting: Cardiology

## 2019-10-24 VITALS — BP 149/92 | HR 68 | Ht 67.0 in | Wt 262.0 lb

## 2019-10-24 DIAGNOSIS — R079 Chest pain, unspecified: Secondary | ICD-10-CM | POA: Diagnosis present

## 2019-10-24 DIAGNOSIS — R600 Localized edema: Secondary | ICD-10-CM

## 2019-10-24 DIAGNOSIS — E785 Hyperlipidemia, unspecified: Secondary | ICD-10-CM

## 2019-10-24 DIAGNOSIS — I10 Essential (primary) hypertension: Secondary | ICD-10-CM

## 2019-10-24 LAB — EXERCISE TOLERANCE TEST
Estimated workload: 4.6 METS
Exercise duration (min): 2 min
Exercise duration (sec): 28 s
MPHR: 159 {beats}/min
Peak HR: 142 {beats}/min
Percent HR: 89 %
RPE: 19
Rest HR: 76 {beats}/min

## 2019-10-24 NOTE — Patient Instructions (Signed)
Medications: Your physician recommends that you continue on your current medications as directed. Please refer to the Current Medication list given to you today.  *If you need a refill on your cardiac medications before your next appointment, please call your pharmacy*  Lab Work: NONE  Testing/Procedures: NONE  Follow-Up: At [facility], you and your health needs are our priority.  As part of our continuing mission to provide you with exceptional heart care, we have created designated Provider Care Teams.  These Care Teams include your primary Cardiologist (physician) and Advanced Practice Providers (APPs -  Physician Assistants and Nurse Practitioners) who all work together to provide you with the care you need, when you need it.  Your next appointment:   6 month(s)  The format for your next appointment:   In Person  Provider:   Oswaldo Milian, MD

## 2019-10-30 MED FILL — LISINOPRIL-HCTZ 20-12.5 MG: 20-12.5 | 30 days supply | Qty: 60 | Fill #1

## 2019-10-30 MED FILL — FUROSEMIDE 20 MG TABS: 20 | 30 days supply | Qty: 30 | Fill #5

## 2019-10-30 MED FILL — CARVEDILOL 3.125 MG TABLET: 3.125 | 30 days supply | Qty: 60 | Fill #3

## 2019-10-30 MED FILL — metFORMIN HCL 500 MG TABS: 500 | 30 days supply | Qty: 120 | Fill #1

## 2019-10-30 MED FILL — POTASSIUM CHLORIDE ER 10 ME: 10 | 30 days supply | Qty: 30 | Fill #4

## 2019-10-30 MED FILL — ATORVASTATIN CALCIUM 40 MG: 40 | 30 days supply | Qty: 30 | Fill #3

## 2019-11-06 ENCOUNTER — Encounter: Payer: Self-pay | Admitting: Gastroenterology

## 2019-11-09 NOTE — Telephone Encounter (Signed)
error 

## 2019-11-27 MED FILL — LISINOPRIL-HCTZ 20-12.5 MG: 20-12.5 | 30 days supply | Qty: 60 | Fill #2

## 2019-11-27 MED FILL — metFORMIN HCL 500 MG TABS: 500 | 30 days supply | Qty: 120 | Fill #2

## 2019-11-27 MED FILL — MELOXICAM 7.5 MG TABLET: 7.5 | 30 days supply | Qty: 30 | Fill #1

## 2019-11-27 MED FILL — FUROSEMIDE 20 MG TABS: 20 | 30 days supply | Qty: 30 | Fill #0

## 2019-11-27 MED FILL — CARVEDILOL 3.125 MG TABLET: 3.125 | 30 days supply | Qty: 60 | Fill #4

## 2019-11-27 MED FILL — ATORVASTATIN CALCIUM 40 MG: 40 | 30 days supply | Qty: 30 | Fill #4

## 2019-11-27 MED FILL — POTASSIUM CHLORIDE ER 10 ME: 10 | 30 days supply | Qty: 30 | Fill #5

## 2019-11-28 ENCOUNTER — Ambulatory Visit: Payer: 59 | Attending: Family Medicine | Admitting: Physician Assistant

## 2019-11-28 ENCOUNTER — Other Ambulatory Visit: Payer: Self-pay

## 2019-11-28 VITALS — BP 158/98 | HR 64 | Temp 98.0°F | Resp 18 | Ht 67.0 in | Wt 263.0 lb

## 2019-11-28 DIAGNOSIS — M7918 Myalgia, other site: Secondary | ICD-10-CM

## 2019-11-28 DIAGNOSIS — E119 Type 2 diabetes mellitus without complications: Secondary | ICD-10-CM

## 2019-11-28 LAB — GLUCOSE, POCT (MANUAL RESULT ENTRY): POC Glucose: 90 mg/dl (ref 70–99)

## 2019-11-28 NOTE — Progress Notes (Signed)
Pt took first COVID vaccine a month ago c /o L arm  pain /unable to fully lift her arm since then   Did not take BP med today

## 2019-11-28 NOTE — Patient Instructions (Signed)
Hematoma A hematoma is a collection of blood under the skin, in an organ, in a body space, in a joint space, or in other tissue. The blood can thicken (clot) to form a lump that you can see and feel. The lump is often firm and may become sore and tender. Most hematomas get better in a few days to weeks. However, some hematomas may be serious and require medical care. Hematomas can range from very small to very large. What are the causes? This condition is caused by:  A blunt or penetrating injury.  A leakage from a blood vessel under the skin.  Some medical procedures, including surgeries, such as oral surgery, face lifts, and surgeries on the joints.  Some medical conditions that cause bleeding or bruising. There may be multiple hematomas that appear in different areas of the body. What increases the risk? You are more likely to develop this condition if:  You are an older adult.  You use blood thinners. What are the signs or symptoms?  Symptoms of this condition depend on where the hematoma is located.  Common symptoms of a hematoma that is under the skin include:  A firm lump on the body.  Pain and tenderness in the area.  Bruising. Blue, dark blue, purple-red, or yellowish skin (discoloration) may appear at the site of the hematoma if the hematoma is close to the surface of the skin. Common symptoms of a hematoma that is deep in the tissues or body spaces may be less obvious. They include:  A collection of blood in the stomach (intra-abdominal hematoma). This may cause pain in the abdomen, weakness, fainting, and shortness of breath.  A collection of blood in the head (intracranial hematoma). This may cause a headache or symptoms such as weakness, trouble speaking or understanding, or a change in consciousness. How is this diagnosed? This condition is diagnosed based on:  Your medical history.  A physical exam.  Imaging tests, such as an ultrasound or CT scan. These may  be needed if your health care provider suspects a hematoma in deeper tissues or body spaces.  Blood tests. These may be needed if your health care provider believes that the hematoma is caused by a medical condition. How is this treated? Treatment for this condition depends on the cause, size, and location of the hematoma. Treatment may include:  Doing nothing. The majority of hematomas do not need treatment as many of them go away on their own over time.  Surgery or close monitoring. This may be needed for large hematomas or hematomas that affect vital organs.  Medicines. Medicines may be given if there is an underlying medical cause for the hematoma. Follow these instructions at home: Managing pain, stiffness, and swelling   If directed, put ice on the affected area. ? Put ice in a plastic bag. ? Place a towel between your skin and the bag. ? Leave the ice on for 20 minutes, 2-3 times a day for the first couple of days.  If directed, apply heat to the affected area after applying ice for a couple of days. Use the heat source that your health care provider recommends, such as a moist heat pack or a heating pad. ? Place a towel between your skin and the heat source. ? Leave the heat on for 20-30 minutes. ? Remove the heat if your skin turns bright red. This is especially important if you are unable to feel pain, heat, or cold. You may have a greater   risk of getting burned.  Raise (elevate) the affected area above the level of your heart while you are sitting or lying down.  If told, wrap the affected area with an elastic bandage. The bandage applies pressure (compression) to the area, which may help to reduce swelling and promote healing. Do not wrap the bandage too tightly around the affected area.  If your hematoma is on a leg or foot (lower extremity) and is painful, your health care provider may recommend crutches. Use them as told by your health care provider. General  instructions  Take over-the-counter and prescription medicines only as told by your health care provider.  Keep all follow-up visits as told by your health care provider. This is important. Contact a health care provider if:  You have a fever.  The swelling or discoloration gets worse.  You develop more hematomas. Get help right away if:  Your pain is worse or your pain is not controlled with medicine.  Your skin over the hematoma breaks or starts bleeding.  Your hematoma is in your chest or abdomen and you have weakness, shortness of breath, or a change in consciousness.  You have a hematoma on your scalp that is caused by a fall or injury, and you also have: ? A headache that gets worse. ? Trouble speaking or understanding speech. ? Weakness. ? Change in alertness or consciousness. Summary  A hematoma is a collection of blood under the skin, in an organ, in a body space, in a joint space, or in other tissue.  This condition usually does not need treatment because many hematomas go away on their own over time.  Large hematomas, or those that may affect vital organs, may need surgical drainage or monitoring. If the hematoma is caused by a medical condition, medicines may be prescribed.  Get help right away if your hematoma breaks or starts to bleed, you have shortness of breath, or you have a headache or trouble speaking after a fall. This information is not intended to replace advice given to you by your health care provider. Make sure you discuss any questions you have with your health care provider. Document Revised: 01/18/2019 Document Reviewed: 01/27/2018 [employer]vier Patient Education  2020 [employer]  

## 2019-11-28 NOTE — Progress Notes (Signed)
Established Patient Office Visit  Subjective:  Patient ID: Emily Dickson, female    DOB: Apr 15, 1956  Age: 64 y.o. MRN: [id number]  CC:  Chief Complaint  Patient presents with  . Arm Pain    HPI Emily Dickson reports that she had her first COVID-19 vaccination on October 15, 2019, states this was given to her in her left deltoid, states that she did have some bleeding afterwards, reports it quickly resolved.  Reports that she had injection site tenderness the next day but quickly resolved.  Reports 1 week later she started having more significant left deltoid pain, difficulty moving or lifting her left arm.  Denies tenderness to the touch.  Denies injury, denies previous shoulder pain. Denies numbness or tingling  Reports that she has used Voltaren with a little relief, no relief with ice pack.  Reports that she had her second COVID-19 injection on November 12, 2019, this time in the right arm, no adverse effects noted.  Of note, patient was screened for coronavirus in December 2020, initially tested positive, was asymptomatic and was tested 3 days later with test being negative.  Reports that she was screened for coronavirus due to stress test procedure.  Reports that she did not take her blood pressure medication this morning, states that she is compliant to her medications, does check her blood pressure on a daily basis, states that it is within normal limits at home.  Denies any hypertensive symptoms  Past Medical History:  Diagnosis Date  . Back pain   . Diabetes mellitus without complication ([place])   . High cholesterol   . Hypertension     History reviewed. No pertinent surgical history.  History reviewed. No pertinent family history.  Social History   Socioeconomic History  . Marital status: Single    Spouse name: Not on file  . Number of children: Not on file  . Years of education: Not on file  . Highest education level: Not on file  Occupational History  . Not  on file  Tobacco Use  . Smoking status: Never Smoker  . Smokeless tobacco: Never Used  Substance and Sexual Activity  . Alcohol use: No  . Drug use: No  . Sexual activity: Yes    Partners: Male    Birth control/protection: None  Other Topics Concern  . Not on file  Social History Narrative  . Not on file   Social Determinants of Health   Financial Resource Strain:   . Difficulty of Paying Living Expenses:   Food Insecurity:   . Worried About Charity fundraiser in the Last Year:   . Arboriculturist in the Last Year:   Transportation Needs:   . Film/video editor (Medical):   Marland Kitchen Lack of Transportation (Non-Medical):   Physical Activity:   . Days of Exercise per Week:   . Minutes of Exercise per Session:   Stress:   . Feeling of Stress :   Social Connections:   . Frequency of Communication with Friends and Family:   . Frequency of Social Gatherings with Friends and Family:   . Attends Religious Services:   . Active Member of Clubs or Organizations:   . Attends Archivist Meetings:   Marland Kitchen Marital Status:   Intimate Partner Violence:   . Fear of Current or Ex-Partner:   . Emotionally Abused:   Marland Kitchen Physically Abused:   . Sexually Abused:     Outpatient Medications Prior to Visit  Medication Sig Dispense Refill  . Apple Cider Vinegar 500 MG TABS Take 500 mg by mouth one time only at 6 PM.    . aspirin EC 81 MG tablet Take 1 tablet (81 mg total) by mouth daily. 90 tablet 1  . atorvastatin (LIPITOR) 40 MG tablet Take 1 tablet (40 mg total) by mouth daily at 6 PM. 90 tablet 1  . Biotin 1000 MCG tablet Take 1,000 mcg by mouth 3 (three) times daily.    . Blood Glucose Monitoring Suppl (ONETOUCH VERIO REFLECT) w/Device KIT 1 kit by Does not apply route 3 (three) times daily. Use as instructed to check blood sugar three times daily. E11.9 1 kit 0  . carvedilol (COREG) 3.125 MG tablet Take 1 tablet (3.125 mg total) by mouth 2 (two) times daily with a meal. 180 tablet 1    . cyclobenzaprine (FLEXERIL) 10 MG tablet Take 1 tablet (10 mg total) by mouth at bedtime. 30 tablet 1  . fluticasone (FLONASE) 50 MCG/ACT nasal spray Place 2 sprays into both nostrils daily. 16 g 6  . furosemide (LASIX) 20 MG tablet Take 1 tablet (20 mg total) by mouth daily. As needed for leg swelling 90 tablet 1  . glucose blood (ONETOUCH VERIO) test strip Use as instructed to check blood sugar three times daily. E11.9 100 each 12  . lisinopril-hydrochlorothiazide (ZESTORETIC) 20-12.5 MG tablet Take 2 tablets by mouth daily. 180 tablet 1  . loratadine (CLARITIN) 10 MG tablet Take 1 tablet (10 mg total) by mouth daily. 30 tablet 5  . meloxicam (MOBIC) 7.5 MG tablet TAKE 1 TABLET (7.5 MG TOTAL) BY MOUTH DAILY. 30 tablet 3  . metFORMIN (GLUCOPHAGE) 500 MG tablet Take 2 tablets (1,000 mg total) by mouth 2 (two) times daily with a meal. 360 tablet 1  . Multiple Vitamin (MULTIVITAMIN WITH MINERALS) TABS tablet Take 1 tablet by mouth daily.    Glory Rosebush Delica Lancets 74M MISC Use as instructed to check blood sugar three times daily. E11.9 100 each 11  . potassium chloride (KLOR-CON) 10 MEQ tablet Take 1 tablet (10 mEq total) by mouth daily. 90 tablet 1  . nitroGLYCERIN (NITROSTAT) 0.4 MG SL tablet Place 1 tablet (0.4 mg total) under the tongue every 5 (five) minutes as needed for chest pain. (Patient not taking: Reported on 11/28/2019) 30 tablet 3   No facility-administered medications prior to visit.    Allergies  Allergen Reactions  . Penicillins Rash    Has patient had a PCN reaction causing immediate rash, facial/tongue/throat swelling, SOB or lightheadedness with hypotension: No Has patient had a PCN reaction causing severe rash involving mucus membranes or skin necrosis: No Has patient had a PCN reaction that required hospitalization No Has patient had a PCN reaction occurring within the last 10 years: No If all of the above answers are "NO", then may proceed with Cephalosporin use.     ROS Review of Systems  Constitutional: Negative for chills, fatigue and fever.  HENT: Negative.   Eyes: Negative.   Respiratory: Negative for chest tightness, shortness of breath and wheezing.   Cardiovascular: Negative for chest pain.  Gastrointestinal: Negative.   Endocrine: Negative.   Genitourinary: Negative.   Musculoskeletal: Positive for arthralgias and myalgias.  Skin: Negative for rash.  Allergic/Immunologic: Negative.   Neurological: Negative for weakness and headaches.  Hematological: Negative.   Psychiatric/Behavioral: Negative.       Objective:    Physical Exam  Constitutional: She appears well-developed and well-nourished. No distress.  HENT:  Head: Normocephalic and atraumatic.  Right Ear: External ear normal.  Left Ear: External ear normal.  Nose: Nose normal.  Mouth/Throat: Oropharynx is clear and moist.  Eyes: Pupils are equal, round, and reactive to light. Conjunctivae and EOM are normal.  Cardiovascular: Normal rate, regular rhythm and normal heart sounds.  Pulmonary/Chest: Effort normal and breath sounds normal.  Abdominal: Soft. Bowel sounds are normal.  Musculoskeletal:     Left shoulder: Pain present. No swelling, deformity, laceration, tenderness, bony tenderness, crepitus or spasms. Decreased range of motion. Decreased strength.     Cervical back: Normal range of motion and neck supple.  Skin: She is not diaphoretic.    BP (!) 158/98   Pulse 64   Temp 98 F (36.7 C)   Resp 18   Ht 5' 7" (1.702 m)   Wt 263 lb (119.3 kg)   SpO2 98%   BMI 41.19 kg/m  Wt Readings from Last 3 Encounters:  11/28/19 263 lb (119.3 kg)  10/24/19 262 lb (118.8 kg)  07/10/19 270 lb (122.5 kg)     Health Maintenance Due  Topic Date Due  . OPHTHALMOLOGY EXAM  Never done  . COLONOSCOPY  Never done  . FOOT EXAM  01/20/2019    There are no preventive care reminders to display for this patient.  Lab Results  Component Value Date   TSH 1.702 04/18/2015    Lab Results  Component Value Date   WBC 4.2 05/20/2016   HGB 12.2 05/20/2016   HCT 38.0 05/20/2016   MCV 86.0 05/20/2016   PLT 181 05/20/2016   Lab Results  Component Value Date   NA 139 07/10/2019   K 3.9 07/10/2019   CO2 30 (H) 07/10/2019   GLUCOSE 91 07/10/2019   BUN 10 07/10/2019   CREATININE 0.54 (L) 07/10/2019   BILITOT 0.4 07/10/2019   ALKPHOS 70 07/10/2019   AST 14 07/10/2019   ALT 12 07/10/2019   PROT 7.0 07/10/2019   ALBUMIN 4.4 07/10/2019   CALCIUM 9.7 07/10/2019   ANIONGAP 17 (H) 05/20/2016   Lab Results  Component Value Date   CHOL 172 07/10/2019   Lab Results  Component Value Date   HDL 64 07/10/2019   Lab Results  Component Value Date   LDLCALC 97 07/10/2019   Lab Results  Component Value Date   TRIG 58 07/10/2019   Lab Results  Component Value Date   CHOLHDL 2.7 07/10/2019   Lab Results  Component Value Date   HGBA1C 5.9 07/10/2019      Assessment & Plan:   Problem List Items Addressed This Visit    None    Visit Diagnoses    Pain of left deltoid    -  Primary   Relevant Orders   US SOFT TISSUE UPPER EXTREMITY LIMITED LEFT (NON-VASCULAR)   Type 2 diabetes mellitus without complication, without long-term current use of insulin (HCC)       Relevant Orders   POCT glucose (manual entry) (Completed)    1. Pain of left deltoid Consulted with Dr. Wright, proceed with ultrasound of left deltoid to rule out hematoma - US SOFT TISSUE UPPER EXTREMITY LIMITED LEFT (NON-VASCULAR); Future  2. Type 2 diabetes mellitus without complication, without long-term current use of insulin (HCC)  - POCT glucose (manual entry)   No orders of the defined types were placed in this encounter.   Follow-up: Return if symptoms worsen or fail to improve.    Cari S Mayers, PA-C 

## 2019-11-29 ENCOUNTER — Telehealth: Payer: Self-pay

## 2019-11-29 NOTE — Telephone Encounter (Signed)
[employer] spoke with Gaynelle Arabian . Stated no pre-authorization needed for the [place] procedure.

## 2019-12-01 ENCOUNTER — Ambulatory Visit (HOSPITAL_COMMUNITY)
Admission: RE | Admit: 2019-12-01 | Discharge: 2019-12-01 | Disposition: A | Discharge status: Home / Self Care | Payer: 59 | Source: Ambulatory Visit | Attending: Physician Assistant | Admitting: Physician Assistant

## 2019-12-01 ENCOUNTER — Other Ambulatory Visit: Payer: Self-pay

## 2019-12-01 DIAGNOSIS — M7918 Myalgia, other site: Secondary | ICD-10-CM | POA: Insufficient documentation

## 2019-12-04 ENCOUNTER — Telehealth: Payer: Self-pay | Admitting: *Deleted

## 2019-12-04 NOTE — Telephone Encounter (Signed)
Patient verified DOB Patient is aware of no mass or hematoma being present. Patient states pain is improving with using the topical gel as well.

## 2019-12-04 NOTE — Telephone Encounter (Signed)
-----   Message from Kennieth Rad, [place] sent at 12/04/2019 10:44 AM EDT ----- Please call patient and let her know that her Ultrasound was negative, it did not show a hematoma or any type of mass in her left upper arm.  Please see how she is doing, if her pain is improving.    Thanks!

## 2019-12-11 ENCOUNTER — Other Ambulatory Visit: Payer: Self-pay

## 2019-12-11 ENCOUNTER — Ambulatory Visit (AMBULATORY_SURGERY_CENTER): Payer: Self-pay | Admitting: *Deleted

## 2019-12-11 VITALS — Temp 97.1°F | Ht 67.0 in | Wt 264.0 lb

## 2019-12-11 DIAGNOSIS — Z1211 Encounter for screening for malignant neoplasm of colon: Secondary | ICD-10-CM

## 2019-12-11 MED ORDER — NA SULFATE-K SULFATE-MG SULF 17.5-3.13-1.6 GM/177ML PO SOLN: 1.0000 | Freq: Once | ORAL | 0 refills | Status: AC

## 2019-12-11 NOTE — Progress Notes (Signed)
2nd covid vaccine on 11/12/19 No egg or soy allergy known to patient  No issues with past sedation with any surgeries  or procedures, no intubation problems  No diet pills per patient No home 02 use per patient  No blood thinners per patient  Pt denies issues with constipation  No A fib or A flutter  EMMI video sent to pt's e mail   Due to the COVID-19 pandemic we are asking patients to follow these guidelines. Please only bring one care partner. Please be aware that your care partner may wait in the car in the parking lot or if they feel like they will be too hot to wait in the car, they may wait in the lobby on the 4th floor. All care partners are required to wear a mask the entire time (we do not have any that we can provide them), they need to practice social distancing, and we will do a Covid check for all patient's and care partners when you arrive. Also we will check their temperature and your temperature. If the care partner waits in their car they need to stay in the parking lot the entire time and we will call them on their cell phone when the patient is ready for discharge so they can bring the car to the front of the building. Also all patient's will need to wear a mask into building.

## 2019-12-20 MED FILL — SUPREP BOWEL PREP KIT: 17.5-3.13-1 | 30 days supply | Qty: 354 | Fill #0

## 2019-12-25 ENCOUNTER — Ambulatory Visit (AMBULATORY_SURGERY_CENTER): Payer: 59 | Admitting: Gastroenterology

## 2019-12-25 ENCOUNTER — Other Ambulatory Visit: Payer: Self-pay

## 2019-12-25 ENCOUNTER — Encounter: Payer: Self-pay | Admitting: Gastroenterology

## 2019-12-25 VITALS — BP 155/82 | HR 59 | Temp 96.8°F | Resp 18 | Ht 67.0 in | Wt 264.0 lb

## 2019-12-25 DIAGNOSIS — Z1211 Encounter for screening for malignant neoplasm of colon: Secondary | ICD-10-CM

## 2019-12-25 DIAGNOSIS — D123 Benign neoplasm of transverse colon: Secondary | ICD-10-CM

## 2019-12-25 DIAGNOSIS — D125 Benign neoplasm of sigmoid colon: Secondary | ICD-10-CM

## 2019-12-25 MED ORDER — SODIUM CHLORIDE 0.9 % IV SOLN: 500.0000 mL | INTRAVENOUS | Status: AC

## 2019-12-25 NOTE — Progress Notes (Signed)
Called to room to assist during endoscopic procedure.  Patient ID and intended procedure confirmed with present staff. Received instructions for my participation in the procedure from the performing physician.  

## 2019-12-25 NOTE — Progress Notes (Signed)
A and O x3. Report to RN. Tolerated MAC anesthesia well.

## 2019-12-25 NOTE — Op Note (Signed)
[facility] Patient Name: Emily Dickson Procedure Date: 12/25/2019 9:36 AM MRN: [id number] Endoscopist: Mauri Pole , MD Age: 64 Referring MD:  Date of Birth: [birth year] Gender: Female Account #: [id number] Procedure:                Colonoscopy Indications:              Screening for colorectal malignant neoplasm Medicines:                Monitored Anesthesia Care Procedure:                Pre-Anesthesia Assessment:                           - Prior to the procedure, a History and Physical                            was performed, and patient medications and                            allergies were reviewed. The patient's tolerance of                            previous anesthesia was also reviewed. The risks                            and benefits of the procedure and the sedation                            options and risks were discussed with the patient.                            All questions were answered, and informed consent                            was obtained. Prior Anticoagulants: The patient has                            taken no previous anticoagulant or antiplatelet                            agents. ASA Grade Assessment: III - A patient with                            severe systemic disease. After reviewing the risks                            and benefits, the patient was deemed in                            satisfactory condition to undergo the procedure.                           After obtaining informed consent, the colonoscope  was passed under direct vision. Throughout the                            procedure, the patient's blood pressure, pulse, and                            oxygen saturations were monitored continuously. The                            Colonoscope was introduced through the anus and                            advanced to the the cecum, identified by                            appendiceal  orifice and ileocecal valve. The                            colonoscopy was performed without difficulty. The                            patient tolerated the procedure well. The quality                            of the bowel preparation was good. The ileocecal                            valve, appendiceal orifice, and rectum were                            photographed. Scope In: 9:50:29 AM Scope Out: 10:04:58 AM Scope Withdrawal Time: 0 hours 8 minutes 3 seconds  Total Procedure Duration: 0 hours 14 minutes 29 seconds  Findings:                 The perianal and digital rectal examinations were                            normal.                           Two sessile polyps were found in the sigmoid colon                            and transverse colon. The polyps were 4 to 7 mm in                            size. These polyps were removed with a cold snare.                            Resection and retrieval were complete.                           A few small-mouthed diverticula were found in the  sigmoid colon and ascending colon.                           Non-bleeding internal hemorrhoids were found during                            retroflexion. The hemorrhoids were small.                           The exam was otherwise without abnormality. Complications:            No immediate complications. Estimated Blood Loss:     Estimated blood loss was minimal. Impression:               - Two 4 to 7 mm polyps in the sigmoid colon and in                            the transverse colon, removed with a cold snare.                            Resected and retrieved.                           - Diverticulosis in the sigmoid colon and in the                            ascending colon.                           - Non-bleeding internal hemorrhoids.                           - The examination was otherwise normal. Recommendation:           - Patient has a contact number  available for                            emergencies. The signs and symptoms of potential                            delayed complications were discussed with the                            patient. Return to normal activities tomorrow.                            Written discharge instructions were provided to the                            patient.                           - Resume previous diet.                           - Continue present medications.                           -  Await pathology results.                           - Repeat colonoscopy in 5-10 years for surveillance                            based on pathology results. Mauri Pole, MD 12/25/2019 10:08:23 AM This report has been signed electronically.

## 2019-12-25 NOTE — Patient Instructions (Signed)
YOU HAD AN ENDOSCOPIC PROCEDURE TODAY AT THE [facility]:   Refer to the procedure report that was given to you for any specific questions about what was found during the examination.  If the procedure report does not answer your questions, please call your gastroenterologist to clarify.  If you requested that your care partner not be given the details of your procedure findings, then the procedure report has been included in a sealed envelope for you to review at your convenience later.  YOU SHOULD EXPECT: Some feelings of bloating in the abdomen. Passage of more gas than usual.  Walking can help get rid of the air that was put into your GI tract during the procedure and reduce the bloating. If you had a lower endoscopy (such as a colonoscopy or flexible sigmoidoscopy) you may notice spotting of blood in your stool or on the toilet paper. If you underwent a bowel prep for your procedure, you may not have a normal bowel movement for a few days.  Please Note:  You might notice some irritation and congestion in your nose or some drainage.  This is from the oxygen used during your procedure.  There is no need for concern and it should clear up in a day or so.  SYMPTOMS TO REPORT IMMEDIATELY:   Following lower endoscopy (colonoscopy or flexible sigmoidoscopy):  Excessive amounts of blood in the stool  Significant tenderness or worsening of abdominal pains  Swelling of the abdomen that is new, acute  Fever of 100F or higher   For urgent or emergent issues, a gastroenterologist can be reached at any hour by calling [phone]. Do not use MyChart messaging for urgent concerns.    DIET:  We do recommend a small meal at first, but then you may proceed to your regular diet.  Drink plenty of fluids but you should avoid alcoholic beverages for 24 hours.  MEDICATIONS: Continue present medications.  Please see handouts given to you by your recovery nurse.  ACTIVITY:  You should plan to  take it easy for the rest of today and you should NOT DRIVE or use heavy machinery until tomorrow (because of the sedation medicines used during the test).    FOLLOW UP: Our staff will call the number listed on your records 48-72 hours following your procedure to check on you and address any questions or concerns that you may have regarding the information given to you following your procedure. If we do not reach you, we will leave a message.  We will attempt to reach you two times.  During this call, we will ask if you have developed any symptoms of COVID 19. If you develop any symptoms (ie: fever, flu-like symptoms, shortness of breath, cough etc.) before then, please call [phone].  If you test positive for Covid 19 in the 2 weeks post procedure, please call and report this information to us.    If any biopsies were taken you will be contacted by phone or by letter within the next 1-3 weeks.  Please call us at [phone] if you have not heard about the biopsies in 3 weeks.   Thank you for allowing us to provide for your healthcare needs today.   SIGNATURES/CONFIDENTIALITY: You and/or your care partner have signed paperwork which will be entered into your electronic medical record.  These signatures attest to the fact that that the information above on your After Visit Summary has been reviewed and is understood.  Full responsibility of the   confidentiality of this discharge information lies with you and/or your care-partner. 

## 2019-12-27 ENCOUNTER — Telehealth: Payer: Self-pay

## 2019-12-27 ENCOUNTER — Encounter: Payer: Self-pay | Admitting: Gastroenterology

## 2019-12-27 ENCOUNTER — Telehealth: Payer: Self-pay | Admitting: *Deleted

## 2019-12-27 NOTE — Telephone Encounter (Signed)
Left message on answering machine. 

## 2019-12-27 NOTE — Telephone Encounter (Signed)
  Follow up Call-  Call back number 12/25/2019  Post procedure Call Back phone  # [phone] cell  Permission to leave phone message Yes  Some recent data might be hidden     Patient questions:  Do you have a fever, pain , or abdominal swelling? No. Pain Score  0 *  Have you tolerated food without any problems? Yes.    Have you been able to return to your normal activities? Yes.    Do you have any questions about your discharge instructions: Diet   No. Medications  No. Follow up visit  No.  Do you have questions or concerns about your Care? No.  Actions: * If pain score is 4 or above: No action needed, pain <4.  1. Have you developed a fever since your procedure? no  2.   Have you had an respiratory symptoms (SOB or cough) since your procedure? no  3.   Have you tested positive for COVID 19 since your procedure no  4.   Have you had any family members/close contacts diagnosed with the COVID 19 since your procedure?  no   If yes to any of these questions please route to Joylene John, RN and Erenest Rasher, RN

## 2020-01-01 MED FILL — METFORMIN HCL 500 MG TABS: 500 | 30 days supply | Qty: 120 | Fill #3

## 2020-01-01 MED FILL — LISINOPRIL-HCTZ 20-12.5 MG: 20-12.5 | 30 days supply | Qty: 60 | Fill #3

## 2020-01-01 MED FILL — ATORVASTATIN CALCIUM 40 MG: 40 | 30 days supply | Qty: 30 | Fill #5

## 2020-01-01 MED FILL — POTASSIUM CHLORIDE ER 10 ME: 10 | 30 days supply | Qty: 30 | Fill #0

## 2020-01-01 MED FILL — FUROSEMIDE 20 MG TABS: 20 | 30 days supply | Qty: 30 | Fill #1

## 2020-01-01 MED FILL — CARVEDILOL 3.125 MG TABLET: 3.125 | 30 days supply | Qty: 60 | Fill #5

## 2020-01-01 MED FILL — MELOXICAM 7.5 MG TABLET: 7.5 | 30 days supply | Qty: 30 | Fill #2

## 2020-01-31 ENCOUNTER — Other Ambulatory Visit: Payer: Self-pay

## 2020-01-31 DIAGNOSIS — E7800 Pure hypercholesterolemia, unspecified: Secondary | ICD-10-CM

## 2020-01-31 MED ORDER — ATORVASTATIN CALCIUM 40 MG PO TABS: 40.0000 mg | ORAL_TABLET | Freq: Every day | ORAL | 1 refills | Status: DC

## 2020-01-31 MED FILL — POTASSIUM CHLORIDE ER 10 ME: 10 | 30 days supply | Qty: 30 | Fill #1

## 2020-01-31 MED FILL — METFORMIN HCL 500 MG TABS: 500 | 30 days supply | Qty: 120 | Fill #4

## 2020-01-31 MED FILL — ATORVASTATIN CALCIUM 40 MG: 40 | 90 days supply | Qty: 90 | Fill #0

## 2020-01-31 MED FILL — FUROSEMIDE 20 MG TABS: 20 | 30 days supply | Qty: 30 | Fill #2

## 2020-01-31 MED FILL — LISINOPRIL-HCTZ 20-12.5 MG: 20-12.5 | 30 days supply | Qty: 60 | Fill #4

## 2020-01-31 MED FILL — MELOXICAM 7.5 MG TABLET: 7.5 | 30 days supply | Qty: 30 | Fill #3

## 2020-02-29 ENCOUNTER — Other Ambulatory Visit: Payer: Self-pay | Admitting: Family Medicine

## 2020-02-29 DIAGNOSIS — M1711 Unilateral primary osteoarthritis, right knee: Secondary | ICD-10-CM

## 2020-02-29 MED FILL — POTASSIUM CHLORIDE ER 10 ME: 10 | 30 days supply | Qty: 30 | Fill #2

## 2020-02-29 MED FILL — FUROSEMIDE 20 MG TABS: 20 | 30 days supply | Qty: 30 | Fill #3

## 2020-02-29 MED FILL — METFORMIN HCL 500 MG TABS: 500 | 30 days supply | Qty: 120 | Fill #5

## 2020-02-29 MED FILL — LISINOPRIL-HCTZ 20-12.5 MG: 20-12.5 | 30 days supply | Qty: 60 | Fill #5

## 2020-03-01 MED FILL — MELOXICAM 7.5 MG TABLET: 7.5 | 30 days supply | Qty: 30 | Fill #0

## 2020-03-13 ENCOUNTER — Ambulatory Visit: Payer: Self-pay

## 2020-03-13 NOTE — Telephone Encounter (Signed)
Patient called stating that she had a trip and fall accident at work on 6/23.  She states that she tripped and fell over a wheelchair and came down on her knees and chest. She states that she had a bruise on her rt shoulder.  She states both knees are sore. Her main reason for calling is that she is now unable to eat without nausea.  She states se has not taken her medication because she can't eat. Her abdomin is not swollen She has call 911 last week and they gave her IV fluids. Per protocol patient will go to UC or ER for evaluation today. No appointment available in office.  Care advice read to patient.  She verbalized understanding.  Reason for Disposition . [1] MODERATE weakness (i.e., interferes with work, school, normal activities) AND [2] new-onset or worsening  Answer Assessment - Initial Assessment Questions 1. MECHANISM: "How did the fall happen?"     Tripped over wheelchair at work 2. DOMESTIC VIOLENCE AND ELDER ABUSE SCREENING: "Did you fall because someone pushed you or tried to hurt you?" If Yes, ask: "Are you safe now?"     N/A 3. ONSET: "When did the fall happen?" (e.g., minutes, hours, or days ago)    2 weeks 6/23 4. LOCATION: "What part of the body hit the ground?" (e.g., back, buttocks, head, hips, knees, hands, head, stomach)   At work group home 5. INJURY: "Did you hurt (injure) yourself when you fell?" If Yes, ask: "What did you injure? Tell me more about this?" (e.g., body area; type of injury; pain severity)"     Knees fell on chest rt shoulder bruise 6. PAIN: "Is there any pain?" If Yes, ask: "How bad is the pain?" (e.g., Scale 1-10; or mild,  moderate, severe)   - NONE (0): no pain   - MILD (1-3): doesn't interfere with normal activities    - MODERATE (4-7): interferes with normal activities or awakens from sleep    - SEVERE (8-10): excruciating pain, unable to do any normal activities     6 chest  Can't eat 7. SIZE: For cuts, bruises, or swelling, ask: "How large is  it?" (e.g., inches or centimeters)      no 8. PREGNANCY: "Is there any chance you are pregnant?" "When was your last menstrual period?"    N/A 9. OTHER SYMPTOMS: "Do you have any other symptoms?" (e.g., dizziness, fever, weakness; new onset or worsening).      Unable to eat stomach hurts 10. CAUSE: "What do you think caused the fall (or falling)?" (e.g., tripped, dizzy spell)       tripped  Protocols used: FALLS AND FALLING-A-AH

## 2020-03-13 NOTE — Telephone Encounter (Signed)
FYI

## 2020-03-13 NOTE — Telephone Encounter (Signed)
FYI per triage nurse. Please fu as appropriate.

## 2020-03-18 ENCOUNTER — Ambulatory Visit: Payer: Self-pay | Admitting: *Deleted

## 2020-03-18 ENCOUNTER — Ambulatory Visit: Payer: Self-pay

## 2020-03-18 NOTE — Telephone Encounter (Signed)
Patient called for herself and when I was introduced into the conversation she states that she has to call back because her son is sick and she is has just received call from his Dr and is talking with them.

## 2020-03-18 NOTE — Telephone Encounter (Signed)
Called with medication question, to ask if ok to take only one tablet instead of prescribed 2 tablets of lisinopril-HCTZ 20-12.5 mg. Reports Saturday checked b/p with diastolic in the 18'M. Denies hypotensive symptoms. Reports she drank water with salt in it.Patient reports she went to urgent care and b/p elevated.  Patient reports her b/p is now back to normal but did not give result only that her normal is 130/ 78. Reports she is no longer taking coreg 3.125mg  2 x day as scheduled on med list. Reports MD said to stop taking. Care advise given. Patient verbalized understanding of care advise and to call back or go to urgent care if symptoms or b/p worsen.   Reason for Disposition . [1] Caller has NON-URGENT medicine question about med that PCP prescribed AND [2] triager unable to answer question  Answer Assessment - Initial Assessment Questions 1. NAME of MEDICATION: "What medicine are you calling about?"     Lisinopril-HCTZ 20-12.5mg   2. QUESTION: "What is your question?" (e.g., medication refill, side effect)     Should I continue to take one tablet or 2 daily due to episode of low b/p on Sunday.  3. PRESCRIBING HCP: "Who prescribed it?" Reason: if prescribed by specialist, call should be referred to that group.     [place] 4. SYMPTOMS: "Do you have any symptoms?"     No. Sunday b/p was low, denies symptoms only low b/p on monitor  and went to urgent care.  5. SEVERITY: If symptoms are present, ask "Are they mild, moderate or severe?"     No symptoms at this time. 6. PREGNANCY:  "Is there any chance that you are pregnant?" "When was your last menstrual period?"     n/a  Protocols used: MEDICATION QUESTION CALL-A-AH

## 2020-03-18 NOTE — Telephone Encounter (Signed)
I would need to see her for an office visit to make additional recommendations.  If she is hypotensive with blood pressure of less than 100/60 I would recommend cutting back on Lisinopril/HCTZ

## 2020-03-18 NOTE — Telephone Encounter (Signed)
As instructed by MD called pt/ message given / verbalized understanding

## 2020-03-28 ENCOUNTER — Other Ambulatory Visit: Payer: Self-pay | Admitting: Family Medicine

## 2020-03-28 DIAGNOSIS — I10 Essential (primary) hypertension: Secondary | ICD-10-CM

## 2020-03-28 DIAGNOSIS — E119 Type 2 diabetes mellitus without complications: Secondary | ICD-10-CM

## 2020-03-28 MED FILL — METFORMIN HCL 500 MG TABS: 500 | 30 days supply | Qty: 120 | Fill #0

## 2020-03-28 MED FILL — NITROGLYCERIN 0.4 MG TAB SL: 0.4 | 25 days supply | Qty: 25 | Fill #1

## 2020-03-28 MED FILL — FUROSEMIDE 20 MG TABS: 20 | 30 days supply | Qty: 30 | Fill #4

## 2020-03-28 MED FILL — POTASSIUM CHLORIDE ER 10 ME: 10 | 30 days supply | Qty: 30 | Fill #3

## 2020-03-28 MED FILL — ATORVASTATIN CALCIUM 20 MG: 20 | 30 days supply | Qty: 30 | Fill #0

## 2020-03-28 MED FILL — LISINOPRIL-HCTZ 20-12.5 MG: 20-12.5 | 30 days supply | Qty: 60 | Fill #0

## 2020-03-29 MED FILL — MELOXICAM 7.5 MG TABLET: 7.5 | 30 days supply | Qty: 30 | Fill #0

## 2020-04-10 ENCOUNTER — Other Ambulatory Visit: Payer: Self-pay

## 2020-04-10 ENCOUNTER — Encounter: Payer: Self-pay | Admitting: Family Medicine

## 2020-04-10 ENCOUNTER — Other Ambulatory Visit: Payer: Self-pay | Admitting: Family Medicine

## 2020-04-10 ENCOUNTER — Ambulatory Visit: Payer: 59 | Attending: Family Medicine | Admitting: Family Medicine

## 2020-04-10 VITALS — BP 118/77 | HR 83 | Ht 67.0 in | Wt 264.8 lb

## 2020-04-10 DIAGNOSIS — E119 Type 2 diabetes mellitus without complications: Secondary | ICD-10-CM

## 2020-04-10 DIAGNOSIS — E876 Hypokalemia: Secondary | ICD-10-CM | POA: Diagnosis not present

## 2020-04-10 DIAGNOSIS — E7800 Pure hypercholesterolemia, unspecified: Secondary | ICD-10-CM

## 2020-04-10 DIAGNOSIS — M791 Myalgia, unspecified site: Secondary | ICD-10-CM

## 2020-04-10 DIAGNOSIS — M1711 Unilateral primary osteoarthritis, right knee: Secondary | ICD-10-CM

## 2020-04-10 DIAGNOSIS — I10 Essential (primary) hypertension: Secondary | ICD-10-CM | POA: Diagnosis not present

## 2020-04-10 LAB — POCT GLYCOSYLATED HEMOGLOBIN (HGB A1C): HbA1c, POC (controlled diabetic range): 6.1 % (ref 0.0–7.0)

## 2020-04-10 LAB — GLUCOSE, POCT (MANUAL RESULT ENTRY): POC Glucose: 95 mg/dl (ref 70–99)

## 2020-04-10 MED ORDER — MELOXICAM 7.5 MG PO TABS: 7.5000 mg | ORAL_TABLET | Freq: Every day | ORAL | 3 refills | Status: DC

## 2020-04-10 MED ORDER — POTASSIUM CHLORIDE ER 10 MEQ PO TBCR: 10.0000 meq | EXTENDED_RELEASE_TABLET | Freq: Every day | ORAL | 1 refills | Status: DC

## 2020-04-10 MED ORDER — LISINOPRIL-HYDROCHLOROTHIAZIDE 20-12.5 MG PO TABS: 2.0000 | ORAL_TABLET | Freq: Every day | ORAL | 1 refills | Status: DC

## 2020-04-10 MED ORDER — ATORVASTATIN CALCIUM 40 MG PO TABS: 40.0000 mg | ORAL_TABLET | Freq: Every day | ORAL | 1 refills | Status: DC

## 2020-04-10 MED ORDER — TRAMADOL HCL 50 MG PO TABS: 50.0000 mg | ORAL_TABLET | Freq: Every evening | ORAL | 1 refills | Status: AC | PRN

## 2020-04-10 MED FILL — traMADol HCL 50 MG TABS: 50 | 7 days supply | Qty: 7 | Fill #0

## 2020-04-10 NOTE — Patient Instructions (Signed)

## 2020-04-10 NOTE — Progress Notes (Signed)
Subjective:  Patient ID: Emily Dickson, female    DOB: 10/23/1955  Age: 64 y.o. MRN: [id number]  CC: Diabetes   HPI Emily Dickson  is a 64 year old female with a history of hypertension, hyperlipidemia, chronic rhinitis, right knee pain, Type 2 DM (A1c6.1). She fell at work 6 weeks ago at work. Her whole body hurts and use of meloxicam has been ineffective.  Prior to this visit she had called complaining of low blood pressures and had reduced her lisinopril 20/12.5 from 2 tablets daily to 1 tablet daily as instructed but then she noticed subsequent elevation of blood pressure on return back to 2 tablets daily. Her blood pressure log from today reveals blood pressures of 104/70, 128/82.she has been without Coreg for 3 months but her blood pressures are normal.  Doing well on Metformin for her diabetes and is compliant with her statin. She exercises regularly by means of walking.  Denies presence of numbness in extremities or visual concerns. Past Medical History:  Diagnosis Date  . Allergy    seasonal  . Arthritis   . Back pain   . Blood transfusion without reported diagnosis    with child birth  . Diabetes mellitus without complication ([place])   . High cholesterol   . Hypertension     History reviewed. No pertinent surgical history.  Family History  Problem Relation Age of Onset  . Colon cancer Neg Hx   . Colon polyps Neg Hx   . Esophageal cancer Neg Hx   . Stomach cancer Neg Hx   . Rectal cancer Neg Hx     Allergies  Allergen Reactions  . Penicillins Rash    Has patient had a PCN reaction causing immediate rash, facial/tongue/throat swelling, SOB or lightheadedness with hypotension: No Has patient had a PCN reaction causing severe rash involving mucus membranes or skin necrosis: No Has patient had a PCN reaction that required hospitalization No Has patient had a PCN reaction occurring within the last 10 years: No If all of the above answers are "NO", then may  proceed with Cephalosporin use.    Outpatient Medications Prior to Visit  Medication Sig Dispense Refill  . Apple Cider Vinegar 500 MG TABS Take 500 mg by mouth one time only at 6 PM.    . aspirin EC 81 MG tablet Take 1 tablet (81 mg total) by mouth daily. 90 tablet 1  . Biotin 1000 MCG tablet Take 1,000 mcg by mouth 3 (three) times daily.    . Blood Glucose Monitoring Suppl (ONETOUCH VERIO REFLECT) w/Device KIT 1 kit by Does not apply route 3 (three) times daily. Use as instructed to check blood sugar three times daily. E11.9 1 kit 0  . cyclobenzaprine (FLEXERIL) 10 MG tablet Take 1 tablet (10 mg total) by mouth at bedtime. 30 tablet 1  . fluticasone (FLONASE) 50 MCG/ACT nasal spray Place 2 sprays into both nostrils daily. 16 g 6  . furosemide (LASIX) 20 MG tablet Take 1 tablet (20 mg total) by mouth daily. As needed for leg swelling 90 tablet 1  . Garlic 6283 MG CAPS Take by mouth.    Marland Kitchen glucose blood (ONETOUCH VERIO) test strip Use as instructed to check blood sugar three times daily. E11.9 100 each 12  . loratadine (CLARITIN) 10 MG tablet Take 1 tablet (10 mg total) by mouth daily. 30 tablet 5  . metFORMIN (GLUCOPHAGE) 500 MG tablet TAKE 2 TABLETS (1,000 MG TOTAL) BY MOUTH 2 (TWO) TIMES DAILY WITH  A MEAL. 120 tablet 1  . Multiple Vitamin (MULTIVITAMIN WITH MINERALS) TABS tablet Take 1 tablet by mouth daily.    Glory Rosebush Delica Lancets 95M MISC Use as instructed to check blood sugar three times daily. E11.9 100 each 11  . atorvastatin (LIPITOR) 40 MG tablet Take 1 tablet (40 mg total) by mouth daily at 6 PM. 90 tablet 1  . lisinopril-hydrochlorothiazide (ZESTORETIC) 20-12.5 MG tablet TAKE 2 TABLETS BY MOUTH DAILY. 60 tablet 1  . meloxicam (MOBIC) 7.5 MG tablet TAKE 1 TABLET (7.5 MG TOTAL) BY MOUTH DAILY. 30 tablet 0  . potassium chloride (KLOR-CON) 10 MEQ tablet Take 1 tablet (10 mEq total) by mouth daily. 90 tablet 1  . nitroGLYCERIN (NITROSTAT) 0.4 MG SL tablet Place 1 tablet (0.4 mg  total) under the tongue every 5 (five) minutes as needed for chest pain. (Patient not taking: Reported on 04/10/2020) 30 tablet 3  . carvedilol (COREG) 3.125 MG tablet Take 1 tablet (3.125 mg total) by mouth 2 (two) times daily with a meal. (Patient not taking: Reported on 04/10/2020) 180 tablet 1   Facility-Administered Medications Prior to Visit  Medication Dose Route Frequency Provider Last Rate Last Admin  . 0.9 %  sodium chloride infusion  500 mL Intravenous Continuous Nandigam, Kavitha V, MD         ROS Review of Systems  Constitutional: Negative for activity change, appetite change and fatigue.  HENT: Negative for congestion, sinus pressure and sore throat.   Eyes: Negative for visual disturbance.  Respiratory: Negative for cough, chest tightness, shortness of breath and wheezing.   Cardiovascular: Negative for chest pain and palpitations.  Gastrointestinal: Negative for abdominal distention, abdominal pain and constipation.  Endocrine: Negative for polydipsia.  Genitourinary: Negative for dysuria and frequency.  Musculoskeletal:       See HPI  Skin: Negative for rash.  Neurological: Negative for tremors, light-headedness and numbness.  Hematological: Does not bruise/bleed easily.  Psychiatric/Behavioral: Negative for agitation and behavioral problems.    Objective:  BP 118/77   Pulse 83   Ht 5' 7"  (1.702 m)   Wt 264 lb 12.8 oz (120.1 kg)   SpO2 99%   BMI 41.47 kg/m   BP/Weight 04/10/2020 8/41/3244 0/09/270  Systolic BP 536 644 -  Diastolic BP 77 82 -  Wt. (Lbs) 264.8 264 264  BMI 41.47 41.35 41.35      Physical Exam Constitutional:      Appearance: She is well-developed.  Neck:     Vascular: No JVD.  Cardiovascular:     Rate and Rhythm: Normal rate.     Heart sounds: Normal heart sounds. No murmur heard.   Pulmonary:     Effort: Pulmonary effort is normal.     Breath sounds: Normal breath sounds. No wheezing or rales.  Chest:     Chest wall: No tenderness.    Abdominal:     General: Bowel sounds are normal. There is no distension.     Palpations: Abdomen is soft. There is no mass.     Tenderness: There is no abdominal tenderness.  Musculoskeletal:        General: Normal range of motion.     Right lower leg: No edema.     Left lower leg: No edema.  Neurological:     Mental Status: She is alert and oriented to person, place, and time.  Psychiatric:        Mood and Affect: Mood normal.     CMP Latest Ref Rng &  Units 07/10/2019 06/15/2018 01/19/2018  Glucose 65 - 99 mg/dL 91 111(H) 93  BUN 8 - 27 mg/dL 10 18 12   Creatinine 0.57 - 1.00 mg/dL 0.54(L) 0.71 0.54(L)  Sodium 134 - 144 mmol/L 139 143 143  Potassium 3.5 - 5.2 mmol/L 3.9 3.5 4.0  Chloride 96 - 106 mmol/L 99 96 101  CO2 20 - 29 mmol/L 30(H) 30(H) 27  Calcium 8.7 - 10.3 mg/dL 9.7 10.2 9.7  Total Protein 6.0 - 8.5 g/dL 7.0 7.2 7.0  Total Bilirubin 0.0 - 1.2 mg/dL 0.4 0.3 0.4  Alkaline Phos 39 - 117 IU/L 70 52 49  AST 0 - 40 IU/L 14 15 15   ALT 0 - 32 IU/L 12 14 14     Lipid Panel     Component Value Date/Time   CHOL 172 07/10/2019 1206   TRIG 58 07/10/2019 1206   HDL 64 07/10/2019 1206   CHOLHDL 2.7 07/10/2019 1206   CHOLHDL 2.6 03/11/2016 1043   VLDL 13 03/11/2016 1043   LDLCALC 97 07/10/2019 1206    CBC    Component Value Date/Time   WBC 4.2 05/20/2016 1525   RBC 4.42 05/20/2016 1525   HGB 12.2 05/20/2016 1525   HCT 38.0 05/20/2016 1525   PLT 181 05/20/2016 1525   MCV 86.0 05/20/2016 1525   MCH 27.6 05/20/2016 1525   MCHC 32.1 05/20/2016 1525   RDW 18.3 (H) 05/20/2016 1525   LYMPHSABS 1.5 06/18/2015 1815   MONOABS 0.8 06/18/2015 1815   EOSABS 0.1 06/18/2015 1815   BASOSABS 0.0 06/18/2015 1815    Lab Results  Component Value Date   HGBA1C 6.1 04/10/2020    Assessment & Plan:  1. Type 2 diabetes mellitus without complication, without long-term current use of insulin (HCC) Controlled with A1c of 6.1; goal is <7.0 Continue current medications - POCT  glucose (manual entry) - POCT glycosylated hemoglobin (Hb A1C) - CMP14+EGFR; Future - Microalbumin / creatinine urine ratio; Future  2. Hypokalemia - potassium chloride (KLOR-CON) 10 MEQ tablet; Take 1 tablet (10 mEq total) by mouth daily.  Dispense: 90 tablet; Refill: 1  3. Pure hypercholesterolemia Controlled - atorvastatin (LIPITOR) 40 MG tablet; Take 1 tablet (40 mg total) by mouth daily at 6 PM.  Dispense: 90 tablet; Refill: 1 - Lipid panel; Future  4. Essential hypertension Controlled Coreg has been discontinued as her blood pressures are controlled Counseled on blood pressure goal of less than 130/80, low-sodium, DASH diet, medication compliance, 150 minutes of moderate intensity exercise per week. Discussed medication compliance, adverse effects. - lisinopril-hydrochlorothiazide (ZESTORETIC) 20-12.5 MG tablet; Take 2 tablets by mouth daily.  Dispense: 60 tablet; Refill: 1  5. Primary osteoarthritis of right knee Stable - meloxicam (MOBIC) 7.5 MG tablet; Take 1 tablet (7.5 mg total) by mouth daily.  Dispense: 30 tablet; Refill: 3  6. Myalgia Due to fall at work Advised to apply heat and massage We will place on tramadol - traMADol (ULTRAM) 50 MG tablet; Take 1 tablet (50 mg total) by mouth at bedtime as needed for up to 5 days.  Dispense: 30 tablet; Refill: 1   Health Care Maintenance: Up-to-date on Pap smear, colonoscopy, mammogram Meds ordered this encounter  Medications  . traMADol (ULTRAM) 50 MG tablet    Sig: Take 1 tablet (50 mg total) by mouth at bedtime as needed for up to 5 days.    Dispense:  30 tablet    Refill:  1  . potassium chloride (KLOR-CON) 10 MEQ tablet    Sig:  Take 1 tablet (10 mEq total) by mouth daily.    Dispense:  90 tablet    Refill:  1  . atorvastatin (LIPITOR) 40 MG tablet    Sig: Take 1 tablet (40 mg total) by mouth daily at 6 PM.    Dispense:  90 tablet    Refill:  1    New dose, d/c previous rx  . lisinopril-hydrochlorothiazide  (ZESTORETIC) 20-12.5 MG tablet    Sig: Take 2 tablets by mouth daily.    Dispense:  60 tablet    Refill:  1  . meloxicam (MOBIC) 7.5 MG tablet    Sig: Take 1 tablet (7.5 mg total) by mouth daily.    Dispense:  30 tablet    Refill:  3    Follow-up: Return in about 6 months (around 10/11/2020) for Chronic disease management.       Charlott Rakes, MD, FAAFP. [facility] [place], [place]   04/10/2020, 3:46 PM

## 2020-04-11 ENCOUNTER — Ambulatory Visit: Payer: 59 | Attending: Family Medicine

## 2020-04-11 DIAGNOSIS — E119 Type 2 diabetes mellitus without complications: Secondary | ICD-10-CM

## 2020-04-11 DIAGNOSIS — E7800 Pure hypercholesterolemia, unspecified: Secondary | ICD-10-CM

## 2020-04-12 ENCOUNTER — Telehealth: Payer: Self-pay

## 2020-04-12 LAB — CMP14+EGFR
ALT: 14 IU/L (ref 0–32)
AST: 13 IU/L (ref 0–40)
Albumin/Globulin Ratio: 1.4 (ref 1.2–2.2)
Albumin: 4.3 g/dL (ref 3.8–4.8)
Alkaline Phosphatase: 65 IU/L (ref 48–121)
BUN/Creatinine Ratio: 16 (ref 12–28)
BUN: 11 mg/dL (ref 8–27)
Bilirubin Total: 0.4 mg/dL (ref 0.0–1.2)
CO2: 27 mmol/L (ref 20–29)
Calcium: 10.2 mg/dL (ref 8.7–10.3)
Chloride: 100 mmol/L (ref 96–106)
Creatinine, Ser: 0.69 mg/dL (ref 0.57–1.00)
GFR calc Af Amer: 108 mL/min/{1.73_m2} (ref >59)
GFR calc non Af Amer: 94 mL/min/{1.73_m2} (ref >59)
Globulin, Total: 3 g/dL (ref 1.5–4.5)
Glucose: 101 mg/dL — ABNORMAL HIGH (ref 65–99)
Potassium: 4 mmol/L (ref 3.5–5.2)
Sodium: 141 mmol/L (ref 134–144)
Total Protein: 7.3 g/dL (ref 6.0–8.5)

## 2020-04-12 LAB — MICROALBUMIN / CREATININE URINE RATIO
Creatinine, Urine: 199.5 mg/dL
Microalb/Creat Ratio: 5 mg/g creat (ref 0–29)
Microalbumin, Urine: 9.8 ug/mL

## 2020-04-12 LAB — LIPID PANEL
Chol/HDL Ratio: 2.7 ratio (ref 0.0–4.4)
Cholesterol, Total: 148 mg/dL (ref 100–199)
HDL: 54 mg/dL (ref >39)
LDL Chol Calc (NIH): 79 mg/dL (ref 0–99)
Triglycerides: 79 mg/dL (ref 0–149)
VLDL Cholesterol Cal: 15 mg/dL (ref 5–40)

## 2020-04-12 NOTE — Telephone Encounter (Signed)
-----   Message from Charlott Rakes, MD sent at 04/12/2020 10:46 AM EDT ----- Please inform the patient that labs are normal. Thank you.

## 2020-04-12 NOTE — Telephone Encounter (Signed)
Patient name and DOB has been verified Patient was informed of lab results. Patient had no questions.  

## 2020-04-15 MED FILL — CYCLOBENZAPRINE 5 MG TABLET: 5 | 14 days supply | Qty: 14 | Fill #0

## 2020-05-02 MED FILL — FUROSEMIDE 20 MG TABS: 20 | 30 days supply | Qty: 30 | Fill #5

## 2020-05-02 MED FILL — POTASSIUM CHLORIDE ER 10 ME: 10 | 30 days supply | Qty: 30 | Fill #4

## 2020-05-02 MED FILL — METFORMIN HCL 500 MG TABS: 500 | 30 days supply | Qty: 120 | Fill #1

## 2020-05-02 MED FILL — ATORVASTATIN CALCIUM 40 MG: 40 | 90 days supply | Qty: 90 | Fill #1

## 2020-05-02 MED FILL — LISINOPRIL-HCTZ 20-12.5 MG: 20-12.5 | 30 days supply | Qty: 60 | Fill #1

## 2020-05-02 MED FILL — MELOXICAM 7.5 MG TABLET: 7.5 | 30 days supply | Qty: 30 | Fill #0

## 2020-05-05 NOTE — Progress Notes (Signed)
Cardiology Office Note:    Date:  05/08/2020   ID:  Emily Dickson, DOB Jan 16, 1956, MRN [id number]  PCP:  Charlott Rakes, MD  Cardiologist:  No primary care provider on file.  Electrophysiologist:  None   Referring MD: Charlott Rakes, MD   Chief Complaint  Patient presents with  . Hypertension    History of Present Illness:    Emily Dickson is a 64 y.o. female with a hx of hypertension, hyperlipidemia, type 2 diabetes who presents for follow-up of chest pain.  She was initially seen on 07/25/2019.  Reports intermittent left-sided chest pain.  States that about 2 months prior she had recurrence of sharp left-sided chest pain.  Describes as stabbing pain in her chest.  Lasts for 20 seconds or so and resolves.  States that she had episodes for about 1 week and has not noticed it since.  Episodes can occur at rest or with exertion.   No smoking history.  No family history of heart disease in immediate family.  TTE on 07/12/2019 shows normal LV systolic function, moderate LVH, grade 1 diastolic dysfunction, normal RV function, no significant valvular disease.    At initial clinic visit on 07/25/2019, exercise tolerance test was ordered to evaluate chest pain.  Test was initially scheduled for December but was delayed as she tested positive for COVID-19.  No symptoms.  She reports there is a family was tested and was negative.  She underwent repeat testing after 3 days and was negative.  ETT was done on 10/24/2019.  Poor exercise capacity, only did 4.6 METS.  Did reach target heart rate, no evidence of ischemia.  Did have hypertensive response to exercise.  Since last clinic visit, she reports that she is doing well.  She had an episode in July where her BP dropped to 86/58.  She felt lightheaded.  EMS was called and she received IV fluids with improvement in her BP.  Her carvedilol was stopped at that time.  She continues on lisinopril-HCTZ and reports BP has been well controlled when she  checks at home (around 120/80).  She denies any further chest pain or shortness of breath.  States that she has been exercising by walking every day on the treadmill for about 1 hour.  Denies any exertional symptoms.   Past Medical History:  Diagnosis Date  . Allergy    seasonal  . Arthritis   . Back pain   . Blood transfusion without reported diagnosis    with child birth  . Diabetes mellitus without complication ([place])   . High cholesterol   . Hypertension     No past surgical history on file.  Current Medications: Current Meds  Medication Sig  . Apple Cider Vinegar 500 MG TABS Take 500 mg by mouth one time only at 6 PM.  . aspirin EC 81 MG tablet Take 1 tablet (81 mg total) by mouth daily.  Marland Kitchen atorvastatin (LIPITOR) 40 MG tablet Take 1 tablet (40 mg total) by mouth daily at 6 PM.  . Blood Glucose Monitoring Suppl (ONETOUCH VERIO REFLECT) w/Device KIT 1 kit by Does not apply route 3 (three) times daily. Use as instructed to check blood sugar three times daily. E11.9  . cyclobenzaprine (FLEXERIL) 10 MG tablet Take 1 tablet (10 mg total) by mouth at bedtime.  . fluticasone (FLONASE) 50 MCG/ACT nasal spray Place 2 sprays into both nostrils daily.  . furosemide (LASIX) 20 MG tablet Take 1 tablet (20 mg total) by mouth daily.  As needed for leg swelling  . Garlic 4166 MG CAPS Take by mouth.  Marland Kitchen glucose blood (ONETOUCH VERIO) test strip Use as instructed to check blood sugar three times daily. E11.9  . lisinopril-hydrochlorothiazide (ZESTORETIC) 20-12.5 MG tablet Take 2 tablets by mouth daily.  Marland Kitchen loratadine (CLARITIN) 10 MG tablet Take 1 tablet (10 mg total) by mouth daily.  . meloxicam (MOBIC) 7.5 MG tablet Take 1 tablet (7.5 mg total) by mouth daily.  . metFORMIN (GLUCOPHAGE) 500 MG tablet TAKE 2 TABLETS (1,000 MG TOTAL) BY MOUTH 2 (TWO) TIMES DAILY WITH A MEAL.  . nitroGLYCERIN (NITROSTAT) 0.4 MG SL tablet Place 1 tablet (0.4 mg total) under the tongue every 5 (five) minutes as needed  for chest pain.  Glory Rosebush Delica Lancets 06T MISC Use as instructed to check blood sugar three times daily. E11.9  . potassium chloride (KLOR-CON) 10 MEQ tablet Take 1 tablet (10 mEq total) by mouth daily.   Current Facility-Administered Medications for the 05/08/20 encounter (Office Visit) with Donato Heinz, MD  Medication  . 0.9 %  sodium chloride infusion     Allergies:   Penicillins   Social History   Socioeconomic History  . Marital status: Single    Spouse name: Not on file  . Number of children: Not on file  . Years of education: Not on file  . Highest education level: Not on file  Occupational History  . Not on file  Tobacco Use  . Smoking status: Never Smoker  . Smokeless tobacco: Never Used  Vaping Use  . Vaping Use: Never used  Substance and Sexual Activity  . Alcohol use: No  . Drug use: No  . Sexual activity: Yes    Partners: Male    Birth control/protection: None  Other Topics Concern  . Not on file  Social History Narrative  . Not on file   Social Determinants of Health   Financial Resource Strain:   . Difficulty of Paying Living Expenses: Not on file  Food Insecurity:   . Worried About Charity fundraiser in the Last Year: Not on file  . Ran Out of Food in the Last Year: Not on file  Transportation Needs:   . Lack of Transportation (Medical): Not on file  . Lack of Transportation (Non-Medical): Not on file  Physical Activity:   . Days of Exercise per Week: Not on file  . Minutes of Exercise per Session: Not on file  Stress:   . Feeling of Stress : Not on file  Social Connections:   . Frequency of Communication with Friends and Family: Not on file  . Frequency of Social Gatherings with Friends and Family: Not on file  . Attends Religious Services: Not on file  . Active Member of Clubs or Organizations: Not on file  . Attends Archivist Meetings: Not on file  . Marital Status: Not on file     Family History: No known  family history of heart disease  ROS:   Please see the history of present illness.    All other systems reviewed and are negative.  EKGs/Labs/Other Studies Reviewed:    The following studies were reviewed today:   EKG:  EKG is  ordered today.  The ekg ordered today demonstrates normal sinus rhythm, rate 92, Q waves in V1/2, QTc 474  TTE 07/12/19:  1. Left ventricular ejection fraction, by visual estimation, is 55 to 60%. The left ventricle has normal function. There is moderately increased left  ventricular hypertrophy.  2. Left ventricular diastolic parameters are consistent with Grade I diastolic dysfunction (impaired relaxation).  3. Global right ventricle has normal systolic function.The right ventricular size is normal. No increase in right ventricular wall thickness.  4. Left atrial size was normal.  5. Right atrial size was normal.  6. The mitral valve is normal in structure. Mild mitral valve regurgitation. No evidence of mitral stenosis.  7. The tricuspid valve is normal in structure. Tricuspid valve regurgitation is not demonstrated.  8. The aortic valve is normal in structure. Aortic valve regurgitation is not visualized. No evidence of aortic valve sclerosis or stenosis.  9. The pulmonic valve was normal in structure. Pulmonic valve regurgitation is not visualized. 10. The inferior vena cava is normal in size with greater than 50% respiratory variability, suggesting right atrial pressure of 3 mmHg.  ETT 10/24/2019:  Blood pressure demonstrated a hypertensive response to exercise.  There was no ST segment deviation noted during stress.  Poor exercise capacity   Negative adequate stress test for ischemia, but poor exercise tolerance, rapid rise in heart rate, and hypertensive response to exercise noted.   Recent Labs: 04/11/2020: ALT 14; BUN 11; Creatinine, Ser 0.69; Potassium 4.0; Sodium 141  Recent Lipid Panel    Component Value Date/Time   CHOL 148 04/11/2020 0850    TRIG 79 04/11/2020 0850   HDL 54 04/11/2020 0850   CHOLHDL 2.7 04/11/2020 0850   CHOLHDL 2.6 03/11/2016 1043   VLDL 13 03/11/2016 1043   LDLCALC 79 04/11/2020 0850    Physical Exam:    VS:  BP (!) 148/86 (BP Location: Left Arm, Patient Position: Sitting, Cuff Size: Large)   Pulse 92   Temp (!) 97.5 F (36.4 C) (Tympanic)   Ht 5' 7"  (1.702 m)   Wt 261 lb 12.8 oz (118.8 kg)   BMI 41.00 kg/m    BP 136/86, P 67  Wt Readings from Last 3 Encounters:  05/08/20 261 lb 12.8 oz (118.8 kg)  04/10/20 264 lb 12.8 oz (120.1 kg)  12/25/19 264 lb (119.7 kg)     GEN: Well nourished, well developed in no acute distress HEENT: Normal NECK: No JVD; No carotid bruits LYMPHATICS: No lymphadenopathy CARDIAC: RRR, no murmurs, rubs, gallops RESPIRATORY:  Clear to auscultation without rales, wheezing or rhonchi  ABDOMEN: Soft, non-tender, non-distended MUSCULOSKELETAL:  No edema; No deformity  SKIN: Warm and dry NEUROLOGIC:  Alert and oriented x 3 PSYCHIATRIC:  Normal affect   ASSESSMENT:    1. Essential hypertension   2. Snoring   3. Chest pain of uncertain etiology   4. Hyperlipidemia, unspecified hyperlipidemia type   5. Lower extremity edema    PLAN:     Chest pain: Atypical in description.  ETT 10/24/2019 shows poor exercise capacity and hypertensive response to exercise, but reached target heart rate and no evidence of ischemia.  Reports chest pain has resolved, no further cardiac work-up recommended at this time.  Hypertension: On lisinopril-hydrochlorothiazide 40-25 mg.  Recent episode of hypotension, carvedilol was discontinued.  Continue current meds  Hyperlipidemia: Last LDL 79 on 04/11/2020.  On atorvastatin 40 mg daily.  Type 2 diabetes: Well controlled, A1c 6.1.  On Metformin  Lower extremity edema: On Lasix 20 mg daily as needed.  No edema on exam today  Snoring: will check sleep study  RTC in 1 year  Medication Adjustments/Labs and Tests Ordered: Current medicines  are reviewed at length with the patient today.  Concerns regarding medicines are outlined above.  Orders Placed This Encounter  Procedures  . EKG 12-Lead  . Split night study   No orders of the defined types were placed in this encounter.   Patient Instructions  Medication Instructions:  Your physician recommends that you continue on your current medications as directed. Please refer to the Current Medication list given to you today.  *If you need a refill on your cardiac medications before your next appointment, please call your pharmacy*  Testing/Procedures: Your physician has recommended that you have a sleep study. This test records several body functions during sleep, including: brain activity, eye movement, oxygen and carbon dioxide blood levels, heart rate and rhythm, breathing rate and rhythm, the flow of air through your mouth and nose, snoring, body muscle movements, and chest and belly movement.  Follow-Up: At [facility], you and your health needs are our priority.  As part of our continuing mission to provide you with exceptional heart care, we have created designated Provider Care Teams.  These Care Teams include your primary Cardiologist (physician) and Advanced Practice Providers (APPs -  Physician Assistants and Nurse Practitioners) who all work together to provide you with the care you need, when you need it.  We recommend signing up for the patient portal called "MyChart".  Sign up information is provided on this After Visit Summary.  MyChart is used to connect with patients for Virtual Visits (Telemedicine).  Patients are able to view lab/test results, encounter notes, upcoming appointments, etc.  Non-urgent messages can be sent to your provider as well.   To learn more about what you can do with MyChart, go to [email or web address].    Your next appointment:   12 month(s)  The format for your next appointment:   In Person  Provider:   Oswaldo Milian,  MD       Signed, Donato Heinz, MD  05/08/2020 11:31 AM    [place]

## 2020-05-08 ENCOUNTER — Ambulatory Visit (INDEPENDENT_AMBULATORY_CARE_PROVIDER_SITE_OTHER): Payer: 59 | Admitting: Cardiology

## 2020-05-08 ENCOUNTER — Encounter: Payer: Self-pay | Admitting: Cardiology

## 2020-05-08 ENCOUNTER — Other Ambulatory Visit: Payer: Self-pay

## 2020-05-08 VITALS — BP 148/86 | HR 92 | Temp 97.5°F | Ht 67.0 in | Wt 261.8 lb

## 2020-05-08 DIAGNOSIS — R079 Chest pain, unspecified: Secondary | ICD-10-CM

## 2020-05-08 DIAGNOSIS — E785 Hyperlipidemia, unspecified: Secondary | ICD-10-CM

## 2020-05-08 DIAGNOSIS — R0683 Snoring: Secondary | ICD-10-CM

## 2020-05-08 DIAGNOSIS — I10 Essential (primary) hypertension: Secondary | ICD-10-CM

## 2020-05-08 DIAGNOSIS — R600 Localized edema: Secondary | ICD-10-CM

## 2020-05-08 NOTE — Patient Instructions (Signed)
Medication Instructions:  Your physician recommends that you continue on your current medications as directed. Please refer to the Current Medication list given to you today.  *If you need a refill on your cardiac medications before your next appointment, please call your pharmacy*  Testing/Procedures: Your physician has recommended that you have a sleep study. This test records several body functions during sleep, including: brain activity, eye movement, oxygen and carbon dioxide blood levels, heart rate and rhythm, breathing rate and rhythm, the flow of air through your mouth and nose, snoring, body muscle movements, and chest and belly movement.  Follow-Up: At [facility], you and your health needs are our priority.  As part of our continuing mission to provide you with exceptional heart care, we have created designated Provider Care Teams.  These Care Teams include your primary Cardiologist (physician) and Advanced Practice Providers (APPs -  Physician Assistants and Nurse Practitioners) who all work together to provide you with the care you need, when you need it.  We recommend signing up for the patient portal called "MyChart".  Sign up information is provided on this After Visit Summary.  MyChart is used to connect with patients for Virtual Visits (Telemedicine).  Patients are able to view lab/test results, encounter notes, upcoming appointments, etc.  Non-urgent messages can be sent to your provider as well.   To learn more about what you can do with MyChart, go to [email or web address].    Your next appointment:   12 month(s)  The format for your next appointment:   In Person  Provider:   Christopher Schumann, MD    

## 2020-05-09 ENCOUNTER — Telehealth: Payer: Self-pay | Admitting: *Deleted

## 2020-05-09 NOTE — Telephone Encounter (Signed)
PA for in lab sleep study submitted to [facility] via fax.

## 2020-05-21 MED FILL — CYCLOBENZAPRINE 5 MG TABLET: 5 | 14 days supply | Qty: 14 | Fill #0

## 2020-05-30 ENCOUNTER — Telehealth: Payer: Self-pay | Admitting: *Deleted

## 2020-05-30 NOTE — Telephone Encounter (Signed)
Left sleep study appointment details and contact information for [place] sleep lab on voice mail.

## 2020-05-30 NOTE — Telephone Encounter (Signed)
Left message call was to inform her of her sleep study appointment date. I will attempt to contact her again.

## 2020-06-04 MED FILL — CYCLOBENZAPRINE 5 MG TABLET: 5 | 5 days supply | Qty: 15 | Fill #0

## 2020-06-13 ENCOUNTER — Other Ambulatory Visit: Payer: Self-pay | Admitting: Family Medicine

## 2020-06-13 DIAGNOSIS — R600 Localized edema: Secondary | ICD-10-CM

## 2020-06-13 DIAGNOSIS — E119 Type 2 diabetes mellitus without complications: Secondary | ICD-10-CM

## 2020-06-13 MED FILL — POTASSIUM CHLORIDE ER 10 ME: 10 | 30 days supply | Qty: 30 | Fill #5

## 2020-06-13 MED FILL — LISINOPRIL-HCTZ 20-12.5 MG: 20-12.5 | 30 days supply | Qty: 60 | Fill #0

## 2020-06-13 MED FILL — MELOXICAM 7.5 MG TABLET: 7.5 | 30 days supply | Qty: 30 | Fill #1

## 2020-06-13 MED FILL — METFORMIN HCL 500 MG TABS: 500 | 30 days supply | Qty: 120 | Fill #0

## 2020-06-13 NOTE — Telephone Encounter (Signed)
Requested medication (s) are due for refill today: {yes  Requested medication (s) are on the active medication list: yes  Last refill:07/10/19  #90  1 refill  Future visit scheduled: no  Notes to clinic: This rx has expiration date 07/09/20  Is patient still using?    Requested Prescriptions  Pending Prescriptions Disp Refills   furosemide (LASIX) 20 MG tablet [Pharmacy Med Name: FUROSEMIDE 20 MG TABS 20 Tablet] 30 tablet 1    Sig: TAKE 1 TABLET (20 MG TOTAL) BY MOUTH DAILY. AS NEEDED FOR LEG SWELLING      Cardiovascular:  Diuretics - Loop Failed - 06/13/2020 10:27 AM      Failed - Last BP in normal range    BP Readings from Last 1 Encounters:  05/08/20 (!) 148/86          Passed - K in normal range and within 360 days    Potassium  Date Value Ref Range Status  04/11/2020 4.0 3.5 - 5.2 mmol/L Final          Passed - Ca in normal range and within 360 days    Calcium  Date Value Ref Range Status  04/11/2020 10.2 8.7 - 10.3 mg/dL Final   Calcium, Ion  Date Value Ref Range Status  07/29/2012 1.21 1.12 - 1.23 mmol/L Final          Passed - Na in normal range and within 360 days    Sodium  Date Value Ref Range Status  04/11/2020 141 134 - 144 mmol/L Final          Passed - Cr in normal range and within 360 days    Creat  Date Value Ref Range Status  09/21/2016 0.54 0.50 - 1.05 mg/dL Final    Comment:      For patients > or = 62 years of age: The upper reference limit for Creatinine is approximately 13% higher for people identified as African-American.      Creatinine, Ser  Date Value Ref Range Status  04/11/2020 0.69 0.57 - 1.00 mg/dL Final          Passed - Valid encounter within last 6 months    Recent Outpatient Visits           2 months ago Type 2 diabetes mellitus without complication, without long-term current use of insulin ([place])   [place], [place], MD   6 months ago Pain of left deltoid   [place], Cari S, PA-C   8 months ago Type 2 diabetes mellitus without complication, without long-term current use of insulin ([place])   [place], [place], MD   11 months ago Type 2 diabetes mellitus without complication, without long-term current use of insulin ([place])   [place], Charlane Ferretti, MD   1 year ago Left arm pain   [place], [place], MD               Signed Prescriptions Disp Refills   metFORMIN (GLUCOPHAGE) 500 MG tablet 360 tablet 1    Sig: TAKE 2 TABLETS (1,000 MG TOTAL) BY MOUTH 2 (TWO) TIMES DAILY WITH A MEAL.      Endocrinology:  Diabetes - Biguanides Passed - 06/13/2020 10:27 AM      Passed - Cr in normal range and within 360 days    Creat  Date Value Ref Range  Status  09/21/2016 0.54 0.50 - 1.05 mg/dL Final    Comment:      For patients > or = 62 years of age: The upper reference limit for Creatinine is approximately 13% higher for people identified as African-American.      Creatinine, Ser  Date Value Ref Range Status  04/11/2020 0.69 0.57 - 1.00 mg/dL Final          Passed - HBA1C is between 0 and 7.9 and within 180 days    HbA1c, POC (prediabetic range)  Date Value Ref Range Status  06/15/2018 5.8 5.7 - 6.4 % Final   HbA1c, POC (controlled diabetic range)  Date Value Ref Range Status  04/10/2020 6.1 0.0 - 7.0 % Final          Passed - eGFR in normal range and within 360 days    GFR, Est African American  Date Value Ref Range Status  09/21/2016 >89 >=60 mL/min Final   GFR calc Af Amer  Date Value Ref Range Status  04/11/2020 108 >59 mL/min/1.73 Final    Comment:    **[facility] currently reports eGFR in compliance with the current**   recommendations of the [employer]. [facility] will   update reporting as new guidelines are published from the NKF-ASN   Task force.    GFR, Est Non  African American  Date Value Ref Range Status  09/21/2016 >89 >=60 mL/min Final   GFR calc non Af Amer  Date Value Ref Range Status  04/11/2020 94 >59 mL/min/1.73 Final          Passed - Valid encounter within last 6 months    Recent Outpatient Visits           2 months ago Type 2 diabetes mellitus without complication, without long-term current use of insulin ([place])   [place], [place], MD   6 months ago Pain of left deltoid   [place] Mayers, Cari S, PA-C   8 months ago Type 2 diabetes mellitus without complication, without long-term current use of insulin ([place])   [place], [place], MD   11 months ago Type 2 diabetes mellitus without complication, without long-term current use of insulin ([place])   [place], [place]ong, MD   1 year ago Left arm pain   [facility] Charlott Rakes, MD

## 2020-06-13 NOTE — Telephone Encounter (Signed)
Requested Prescriptions  Pending Prescriptions Disp Refills   metFORMIN (GLUCOPHAGE) 500 MG tablet [Pharmacy Med Name: METFORMIN HCL 500 MG TABS 500 Tablet] 360 tablet 1    Sig: TAKE 2 TABLETS (1,000 MG TOTAL) BY MOUTH 2 (TWO) TIMES DAILY WITH A MEAL.     Endocrinology:  Diabetes - Biguanides Passed - 06/13/2020 10:27 AM      Passed - Cr in normal range and within 360 days    Creat  Date Value Ref Range Status  09/21/2016 0.54 0.50 - 1.05 mg/dL Final    Comment:      For patients > or = 62 years of age: The upper reference limit for Creatinine is approximately 13% higher for people identified as African-American.      Creatinine, Ser  Date Value Ref Range Status  04/11/2020 0.69 0.57 - 1.00 mg/dL Final         Passed - HBA1C is between 0 and 7.9 and within 180 days    HbA1c, POC (prediabetic range)  Date Value Ref Range Status  06/15/2018 5.8 5.7 - 6.4 % Final   HbA1c, POC (controlled diabetic range)  Date Value Ref Range Status  04/10/2020 6.1 0.0 - 7.0 % Final         Passed - eGFR in normal range and within 360 days    GFR, Est African American  Date Value Ref Range Status  09/21/2016 >89 >=60 mL/min Final   GFR calc Af Amer  Date Value Ref Range Status  04/11/2020 108 >59 mL/min/1.73 Final    Comment:    **[facility] currently reports eGFR in compliance with the current**   recommendations of the [employer]. [facility] will   update reporting as new guidelines are published from the NKF-ASN   Task force.    GFR, Est Non African American  Date Value Ref Range Status  09/21/2016 >89 >=60 mL/min Final   GFR calc non Af Amer  Date Value Ref Range Status  04/11/2020 94 >59 mL/min/1.73 Final         Passed - Valid encounter within last 6 months    Recent Outpatient Visits          2 months ago Type 2 diabetes mellitus without complication, without long-term current use of insulin ([place])   [place], [place],  MD   6 months ago Pain of left deltoid   [place] Mayers, Cari S, PA-C   8 months ago Type 2 diabetes mellitus without complication, without long-term current use of insulin ([place])   [place], [place], MD   11 months ago Type 2 diabetes mellitus without complication, without long-term current use of insulin ([place])   [place], Charlane Ferretti, MD   1 year ago Left arm pain   [place], [place], MD              furosemide (LASIX) 20 MG tablet [Pharmacy Med Name: FUROSEMIDE 20 MG TABS 20 Tablet] 30 tablet 1    Sig: TAKE 1 TABLET (20 MG TOTAL) BY MOUTH DAILY. AS NEEDED FOR LEG SWELLING     Cardiovascular:  Diuretics - Loop Failed - 06/13/2020 10:27 AM      Failed - Last BP in normal range    BP Readings from Last 1 Encounters:  05/08/20 (!) 148/86         Passed - K  in normal range and within 360 days    Potassium  Date Value Ref Range Status  04/11/2020 4.0 3.5 - 5.2 mmol/L Final         Passed - Ca in normal range and within 360 days    Calcium  Date Value Ref Range Status  04/11/2020 10.2 8.7 - 10.3 mg/dL Final   Calcium, Ion  Date Value Ref Range Status  07/29/2012 1.21 1.12 - 1.23 mmol/L Final         Passed - Na in normal range and within 360 days    Sodium  Date Value Ref Range Status  04/11/2020 141 134 - 144 mmol/L Final         Passed - Cr in normal range and within 360 days    Creat  Date Value Ref Range Status  09/21/2016 0.54 0.50 - 1.05 mg/dL Final    Comment:      For patients > or = 62 years of age: The upper reference limit for Creatinine is approximately 13% higher for people identified as African-American.      Creatinine, Ser  Date Value Ref Range Status  04/11/2020 0.69 0.57 - 1.00 mg/dL Final         Passed - Valid encounter within last 6 months    Recent Outpatient Visits          2 months  ago Type 2 diabetes mellitus without complication, without long-term current use of insulin ([place])   [place], [place], MD   6 months ago Pain of left deltoid   [place] Mayers, Cari S, PA-C   8 months ago Type 2 diabetes mellitus without complication, without long-term current use of insulin ([place])   [place], [place], MD   11 months ago Type 2 diabetes mellitus without complication, without long-term current use of insulin ([place])   [place], Enobong, MD   1 year ago Left arm pain   [place] [facility] Charlott Rakes, MD

## 2020-06-14 ENCOUNTER — Other Ambulatory Visit: Payer: Self-pay | Admitting: Family Medicine

## 2020-06-14 MED FILL — FUROSEMIDE 20 MG TABS: 20 | 30 days supply | Qty: 30 | Fill #0

## 2020-06-18 DIAGNOSIS — M25561 Pain in right knee: Secondary | ICD-10-CM | POA: Insufficient documentation

## 2020-06-18 DIAGNOSIS — M25562 Pain in left knee: Secondary | ICD-10-CM | POA: Insufficient documentation

## 2020-07-03 ENCOUNTER — Encounter (HOSPITAL_BASED_OUTPATIENT_CLINIC_OR_DEPARTMENT_OTHER): Payer: 59 | Admitting: Cardiovascular Disease

## 2020-07-15 ENCOUNTER — Other Ambulatory Visit: Payer: Self-pay | Admitting: Family Medicine

## 2020-07-15 DIAGNOSIS — Z1231 Encounter for screening mammogram for malignant neoplasm of breast: Secondary | ICD-10-CM

## 2020-07-16 ENCOUNTER — Ambulatory Visit: Payer: 59 | Attending: Family Medicine

## 2020-07-16 ENCOUNTER — Other Ambulatory Visit: Payer: Self-pay

## 2020-07-16 DIAGNOSIS — Z23 Encounter for immunization: Secondary | ICD-10-CM

## 2020-07-16 DIAGNOSIS — I10 Essential (primary) hypertension: Secondary | ICD-10-CM | POA: Diagnosis not present

## 2020-07-16 MED FILL — METFORMIN HCL 500 MG TABS: 500 | 30 days supply | Qty: 120 | Fill #1

## 2020-07-16 MED FILL — MELOXICAM 7.5 MG TABLET: 7.5 | 30 days supply | Qty: 30 | Fill #2

## 2020-07-16 MED FILL — POTASSIUM CHLORIDE ER 10 ME: 10 | 90 days supply | Qty: 90 | Fill #0

## 2020-07-16 MED FILL — LISINOPRIL-HCTZ 20-12.5 MG: 20-12.5 | 30 days supply | Qty: 60 | Fill #1

## 2020-07-16 MED FILL — FUROSEMIDE 20 MG TABS: 20 | 30 days supply | Qty: 30 | Fill #0

## 2020-07-16 NOTE — Progress Notes (Signed)
Flu vaccine administered in L deltoid, pt tolerated well w/o adverse reaction noted

## 2020-08-07 ENCOUNTER — Other Ambulatory Visit: Payer: Self-pay

## 2020-08-07 ENCOUNTER — Ambulatory Visit (HOSPITAL_BASED_OUTPATIENT_CLINIC_OR_DEPARTMENT_OTHER): Payer: 59 | Attending: Cardiology | Admitting: Cardiovascular Disease

## 2020-08-07 DIAGNOSIS — G4733 Obstructive sleep apnea (adult) (pediatric): Secondary | ICD-10-CM | POA: Diagnosis not present

## 2020-08-07 DIAGNOSIS — I10 Essential (primary) hypertension: Secondary | ICD-10-CM | POA: Insufficient documentation

## 2020-08-07 DIAGNOSIS — R0683 Snoring: Secondary | ICD-10-CM

## 2020-08-12 ENCOUNTER — Other Ambulatory Visit: Payer: Self-pay | Admitting: Family Medicine

## 2020-08-12 DIAGNOSIS — I10 Essential (primary) hypertension: Secondary | ICD-10-CM

## 2020-08-12 MED FILL — METFORMIN HCL 500 MG TABS: 500 | 90 days supply | Qty: 360 | Fill #2

## 2020-08-12 MED FILL — FUROSEMIDE 20 MG TABS: 20 | 30 days supply | Qty: 30 | Fill #1

## 2020-08-12 MED FILL — MELOXICAM 7.5 MG TABLET: 7.5 | 30 days supply | Qty: 30 | Fill #3

## 2020-08-12 MED FILL — LISINOPRIL-HCTZ 20-12.5 MG: 20-12.5 | 30 days supply | Qty: 60 | Fill #0

## 2020-08-12 MED FILL — ATORVASTATIN CALCIUM 40 MG: 40 | 90 days supply | Qty: 90 | Fill #0

## 2020-08-15 ENCOUNTER — Other Ambulatory Visit: Payer: Self-pay | Admitting: Family Medicine

## 2020-08-15 DIAGNOSIS — J3089 Other allergic rhinitis: Secondary | ICD-10-CM

## 2020-08-15 MED FILL — DICLOFENAC SOD EC 75 MG TAB: 75 | 30 days supply | Qty: 60 | Fill #0

## 2020-08-15 MED FILL — LORATADINE 10 MG TABLET: 10 | 30 days supply | Qty: 30 | Fill #0

## 2020-08-15 NOTE — Telephone Encounter (Signed)
Requested medication (s) are due for refill today: yes  Requested medication (s) are on the active medication list:yes  Last refill:  10/21/17  #30  5 refills  Future visit scheduled: yes  Notes to clinic:  Rx has end date 10/21/18    Requested Prescriptions  Pending Prescriptions Disp Refills   GNP LORATADINE 10 MG tablet [Pharmacy Med Name: LORATADINE 10 MG TABLET 10 Tablet] 30 tablet 5    Sig: TAKE 1 TABLET (10 MG TOTAL) BY MOUTH DAILY.      Ear, Nose, and Throat:  Antihistamines Passed - 08/15/2020 10:36 AM      Passed - Valid encounter within last 12 months    Recent Outpatient Visits           4 months ago Type 2 diabetes mellitus without complication, without long-term current use of insulin ([place])   [place], [place], MD   8 months ago Pain of left deltoid   [place] Mayers, Cari S, PA-C   10 months ago Type 2 diabetes mellitus without complication, without long-term current use of insulin ([place])   [place], [place], MD   1 year ago Type 2 diabetes mellitus without complication, without long-term current use of insulin ([place])   [place], Enobong, MD   1 year ago Left arm pain   [place], MD       Future Appointments             In 2 months Charlott Rakes, MD [place]

## 2020-08-18 ENCOUNTER — Encounter (HOSPITAL_BASED_OUTPATIENT_CLINIC_OR_DEPARTMENT_OTHER): Payer: Self-pay | Admitting: Cardiovascular Disease

## 2020-08-18 NOTE — Procedures (Signed)
Patient Name: Emily Dickson, Emily Dickson Date: 08/07/2020 Gender: Female D.O.B: 1956/01/10 Age (years): 64 Referring Provider: Oswaldo Milian Height (inches): 62 Interpreting Physician: Shelva Majestic MD, ABSM Weight (lbs): 256 RPSGT: Zadie Rhine BMI: 41 MRN: [id number] Neck Size: 14.00  CLINICAL INFORMATION Sleep Study Type: Split Night CPAP  Indication for sleep study: OSA, Snoring  Epworth Sleepiness Score: 1  SLEEP STUDY TECHNIQUE As per the AASM Manual for the Scoring of Sleep and Associated Events v2.3 (April 2016) with a hypopnea requiring 4% desaturations.  The channels recorded and monitored were frontal, central and occipital EEG, electrooculogram (EOG), submentalis EMG (chin), nasal and oral airflow, thoracic and abdominal wall motion, anterior tibialis EMG, snore microphone, electrocardiogram, and pulse oximetry. Continuous positive airway pressure (CPAP) was initiated when the patient met split night criteria and was titrated according to treat sleep-disordered breathing.  MEDICATIONS aspirin EC 81 MG tablet atorvastatin (LIPITOR) 40 MG tablet Biotin 1000 MCG tablet Blood Glucose Monitoring Suppl (ONETOUCH VERIO REFLECT) w/Device KIT cyclobenzaprine (FLEXERIL) 10 MG tablet fluticasone (FLONASE) 50 MCG/ACT nasal spray furosemide (LASIX) 20 MG tablet Garlic 1572 MG CAPS glucose blood (ONETOUCH VERIO) test strip GNP LORATADINE 10 MG tablet lisinopril-hydrochlorothiazide (ZESTORETIC) 20-12.5 MG tablet meloxicam (MOBIC) 7.5 MG tablet metFORMIN (GLUCOPHAGE) 500 MG tablet Multiple Vitamin (MULTIVITAMIN WITH MINERALS) TABS tablet nitroGLYCERIN (NITROSTAT) 0.4 MG SL tablet OneTouch Delica Lancets 62M MISC potassium chloride (KLOR-CON) 10 MEQ tablet Clinic-Administered  Medications self-administered by patient taken the night of the study : N/A  RESPIRATORY PARAMETERS Diagnostic Total AHI (/hr): 13.7 RDI (/hr): 22.3 OA Index (/hr): 1.2 CA Index  (/hr): 0.0 REM AHI (/hr): 65.2 NREM AHI (/hr): 6.7 Supine AHI (/hr): 13.7 Non-supine AHI (/hr): 0 Min O2 Sat (%): 80.0 Mean O2 (%): 95.0 Time below 88% (min): 2.4   Titration Optimal Pressure (cm): 9 AHI at Optimal Pressure (/hr): 0.0 Min O2 at Optimal Pressure (%): 93.0 Supine % at Optimal (%): 100 Sleep % at Optimal (%): 84   SLEEP ARCHITECTURE The recording time for the entire night was 419.3 minutes.  During a baseline period of 248.3 minutes, the patient slept for 193.4 minutes in REM and nonREM, yielding a sleep efficiency of 77.9%%. Sleep onset after lights out was 32.9 minutes with a REM latency of 97.0 minutes. The patient spent 7.0%% of the night in stage N1 sleep, 77.5%% in stage N2 sleep, 3.6%% in stage N3 and 11.9% in REM.  During the titration period of 167.1 minutes, the patient slept for 145.5 minutes in REM and nonREM, yielding a sleep efficiency of 87.1%%. Sleep onset after CPAP initiation was 10.2 minutes with a REM latency of 51.5 minutes. The patient spent 6.2%% of the night in stage N1 sleep, 63.9%% in stage N2 sleep, 0.0%% in stage N3 and 29.9% in REM.  CARDIAC DATA The 2 lead EKG demonstrated sinus rhythm. The mean heart rate was 100.0 beats per minute. Other EKG findings include: None.  LEG MOVEMENT DATA The total Periodic Limb Movements of Sleep (PLMS) were 0. The PLMS index was 0.0 .  IMPRESSIONS - Mild -moderate obstructive sleep apnea occurred during the diagnostic portion of the study (AHI 13.7 /h; RDI 22.3); however, events were severe during REM sleep (AHI 65.2/h). CPAP was initiated at 6 cm and titrated to optimal PAP pressure at 9 cm of water (AHI 0; O2 nadir 93%). - No significant central sleep apnea occurred during the diagnostic portion of the study (CAI 0.0/h). - Moderate oxygen desaturation during the diagnostic portion of the study  to a nadir of 80.0%. - Mild to moderate snoring was eliminated with CPAP.  - No cardiac abnormalities were noted during  this study. - Clinically significant periodic limb movements did not occur during sleep.  DIAGNOSIS - Obstructive Sleep Apnea (G47.33)  RECOMMENDATIONS - Recommend an initial trial of CPAP therapy on 9 cm H2O with heated humidification. A Wide size Resmed Nasal Mask Mirage FX mask was used for the titration. - Effort should be made to optimize nasal and oropharyngeal patency.  - Avoid alcohol, sedatives and other CNS depressants that may worsen sleep apnea and disrupt normal sleep architecture. - Sleep hygiene should be reviewed to assess factors that may improve sleep quality. - Weight management (BMI 41) and regular exercise should be initiated or continued. - Recommend a download in 30 days and sleep clinic evaluation after 4 weeks of therapy  [Electronically signed] 08/18/2020 08:12 AM  Shelva Majestic MD, Tomasa Hose Diplomate, American Board of Sleep Medicine   NPI: [id number] [place] PH: [phone]   FX: [phone] [place]

## 2020-08-21 ENCOUNTER — Telehealth: Payer: Self-pay | Admitting: *Deleted

## 2020-08-21 DIAGNOSIS — I10 Essential (primary) hypertension: Secondary | ICD-10-CM

## 2020-08-21 DIAGNOSIS — R0683 Snoring: Secondary | ICD-10-CM

## 2020-08-21 NOTE — Telephone Encounter (Addendum)
Informed patient of sleep study results and patient understanding was verbalized. Patient understands her sleep study showed   IMPRESSIONS - Mild -moderate obstructive sleep apnea occurred during the diagnostic portion of the study (AHI 13.7 /h; RDI 22.3); however, events were severe during REM sleep (AHI 65.2/h). CPAP was initiated at 6 cm and titrated to optimal PAP pressure at 9 cm of water (AHI 0; O2 nadir 93%). - No significant central sleep apnea occurred during the diagnostic portion of the study (CAI 0.0/h). - Moderate oxygen desaturation during the diagnostic portion of the study to a nadir of 80.0%. - Mild to moderate snoring was eliminated with CPAP.  - No cardiac abnormalities were noted during this study. - Clinically significant periodic limb movements did not occur during sleep.  DIAGNOSIS - Obstructive Sleep Apnea (G47.33)  RECOMMENDATIONS - Recommend an initial trial of CPAP therapy on 9 cm H2O with heated humidification. A Wide size Resmed Nasal Mask Mirage FX mask was used for the titration. PER DPR Left detailed message on voicemail and informed patient to call back with questions

## 2020-08-21 NOTE — Telephone Encounter (Signed)
-----   Message from [name], MD sent at 08/18/2020  8:18 AM EST ----- Emily Dickson, please notify pt and set up with DME for CPAP initiaition.

## 2020-09-05 ENCOUNTER — Other Ambulatory Visit: Payer: Self-pay | Admitting: Family Medicine

## 2020-09-05 DIAGNOSIS — R600 Localized edema: Secondary | ICD-10-CM

## 2020-09-05 DIAGNOSIS — M1711 Unilateral primary osteoarthritis, right knee: Secondary | ICD-10-CM

## 2020-09-05 MED FILL — MELOXICAM 7.5 MG TABLET: 7.5 | 30 days supply | Qty: 30 | Fill #0

## 2020-09-05 MED FILL — LISINOPRIL-HCTZ 20-12.5 MG: 20-12.5 | 30 days supply | Qty: 60 | Fill #1

## 2020-09-05 MED FILL — FUROSEMIDE 20 MG TABS: 20 | 30 days supply | Qty: 30 | Fill #0

## 2020-09-10 ENCOUNTER — Ambulatory Visit
Admission: RE | Admit: 2020-09-10 | Discharge: 2020-09-10 | Disposition: A | Discharge status: Home / Self Care | Payer: 59 | Source: Ambulatory Visit | Attending: Family Medicine | Admitting: Family Medicine

## 2020-09-10 ENCOUNTER — Other Ambulatory Visit: Payer: Self-pay

## 2020-09-10 DIAGNOSIS — Z1231 Encounter for screening mammogram for malignant neoplasm of breast: Secondary | ICD-10-CM

## 2020-09-16 ENCOUNTER — Other Ambulatory Visit: Payer: Self-pay | Admitting: Family Medicine

## 2020-09-26 ENCOUNTER — Other Ambulatory Visit: Payer: Self-pay | Admitting: Orthopedic Surgery

## 2020-10-08 ENCOUNTER — Ambulatory Visit: Payer: 59

## 2020-10-12 ENCOUNTER — Other Ambulatory Visit: Payer: Self-pay | Admitting: Nurse Practitioner

## 2020-10-12 DIAGNOSIS — I10 Essential (primary) hypertension: Secondary | ICD-10-CM

## 2020-10-12 MED ORDER — LISINOPRIL-HYDROCHLOROTHIAZIDE 20-12.5 MG PO TABS: 2.0000 | ORAL_TABLET | Freq: Every day | ORAL | 0 refills | Status: DC

## 2020-10-16 ENCOUNTER — Other Ambulatory Visit: Payer: Self-pay | Admitting: Nurse Practitioner

## 2020-10-16 ENCOUNTER — Ambulatory Visit: Payer: 59 | Admitting: Family Medicine

## 2020-10-16 DIAGNOSIS — I10 Essential (primary) hypertension: Secondary | ICD-10-CM

## 2020-10-23 ENCOUNTER — Other Ambulatory Visit: Payer: Self-pay | Admitting: Nurse Practitioner

## 2020-10-23 DIAGNOSIS — I10 Essential (primary) hypertension: Secondary | ICD-10-CM

## 2020-10-23 NOTE — Telephone Encounter (Signed)
   Requested medications are on the active medication list yes  Last refill 2/6  Last visit 05/08/20  Future visit scheduled 12/11/20  Notes to clinic rx states take for 10 days and see PCP on 2/9. Please assess.

## 2020-11-21 ENCOUNTER — Ambulatory Visit: Payer: 59

## 2020-12-11 ENCOUNTER — Other Ambulatory Visit: Payer: Self-pay

## 2020-12-11 ENCOUNTER — Encounter: Payer: Self-pay | Admitting: Family Medicine

## 2020-12-11 ENCOUNTER — Ambulatory Visit: Payer: 59 | Attending: Family Medicine | Admitting: Family Medicine

## 2020-12-11 VITALS — BP 121/79 | HR 64 | Ht 66.0 in | Wt 266.8 lb

## 2020-12-11 DIAGNOSIS — J3089 Other allergic rhinitis: Secondary | ICD-10-CM | POA: Diagnosis not present

## 2020-12-11 DIAGNOSIS — M1711 Unilateral primary osteoarthritis, right knee: Secondary | ICD-10-CM

## 2020-12-11 DIAGNOSIS — E119 Type 2 diabetes mellitus without complications: Secondary | ICD-10-CM | POA: Diagnosis not present

## 2020-12-11 DIAGNOSIS — E7800 Pure hypercholesterolemia, unspecified: Secondary | ICD-10-CM

## 2020-12-11 DIAGNOSIS — E876 Hypokalemia: Secondary | ICD-10-CM | POA: Diagnosis not present

## 2020-12-11 DIAGNOSIS — I10 Essential (primary) hypertension: Secondary | ICD-10-CM | POA: Diagnosis not present

## 2020-12-11 LAB — POCT GLYCOSYLATED HEMOGLOBIN (HGB A1C): Hemoglobin A1C: 6.2 % — AB (ref 4.0–5.6)

## 2020-12-11 LAB — GLUCOSE, POCT (MANUAL RESULT ENTRY): POC Glucose: 113 mg/dl — AB (ref 70–99)

## 2020-12-11 MED ORDER — METFORMIN HCL 500 MG PO TABS: 1000.0000 mg | ORAL_TABLET | Freq: Two times a day (BID) | ORAL | 1 refills | Status: DC

## 2020-12-11 MED ORDER — OZEMPIC (0.25 OR 0.5 MG/DOSE) 2 MG/1.5ML ~~LOC~~ SOPN: 0.5000 mg | PEN_INJECTOR | SUBCUTANEOUS | 6 refills | Status: DC

## 2020-12-11 MED ORDER — ATORVASTATIN CALCIUM 40 MG PO TABS: 40.0000 mg | ORAL_TABLET | Freq: Every day | ORAL | 1 refills | Status: DC

## 2020-12-11 MED ORDER — POTASSIUM CHLORIDE ER 10 MEQ PO TBCR: 10.0000 meq | EXTENDED_RELEASE_TABLET | Freq: Every day | ORAL | 1 refills | Status: DC

## 2020-12-11 MED ORDER — LISINOPRIL-HYDROCHLOROTHIAZIDE 20-12.5 MG PO TABS: 2.0000 | ORAL_TABLET | Freq: Every day | ORAL | 1 refills | Status: DC

## 2020-12-11 MED ORDER — FLUTICASONE PROPIONATE 50 MCG/ACT NA SUSP: 2.0000 | Freq: Every day | NASAL | 6 refills | Status: AC

## 2020-12-11 NOTE — Progress Notes (Signed)
Subjective:  Patient ID: Emily Dickson, female    DOB: 1956/06/10  Age: 66 y.o. MRN: [id number]  CC: Follow-up   HPI Emily Dickson is a 65 year old female with a history of hypertension, hyperlipidemia, chronic rhinitis, right knee pain, Type 2 DM (A1c6.2). She has been under the care of orthopedics for osteoarthritis of her knees and has received several cortisone injections, completed PT and was out of work for some time and is now on diclofenac with slight improvement in her symptoms.  With regards to her diabetes mellitus she is compliant with Metformin but of note has gained 10 pounds in the last 3 months.  She is wondering if she can try Ozempic to help with weight loss.  Endorses compliance with a diabetic diet and she tries to get some walking in as a form of exercise. Compliant with her antihypertensive and statin which she is tolerating well. She has no additional concerns at this time.  Past Medical History:  Diagnosis Date  . Allergy    seasonal  . Arthritis   . Back pain   . Blood transfusion without reported diagnosis    with child birth  . Diabetes mellitus without complication ([place])   . High cholesterol   . Hypertension     No past surgical history on file.  Family History  Problem Relation Age of Onset  . Colon cancer Neg Hx   . Colon polyps Neg Hx   . Esophageal cancer Neg Hx   . Stomach cancer Neg Hx   . Rectal cancer Neg Hx     Allergies  Allergen Reactions  . Penicillins Rash    Has patient had a PCN reaction causing immediate rash, facial/tongue/throat swelling, SOB or lightheadedness with hypotension: No Has patient had a PCN reaction causing severe rash involving mucus membranes or skin necrosis: No Has patient had a PCN reaction that required hospitalization No Has patient had a PCN reaction occurring within the last 10 years: No If all of the above answers are "NO", then may proceed with Cephalosporin use.    Outpatient Medications  Prior to Visit  Medication Sig Dispense Refill  . aspirin EC 81 MG tablet Take 1 tablet (81 mg total) by mouth daily. 90 tablet 1  . Blood Glucose Monitoring Suppl (ONETOUCH VERIO REFLECT) w/Device KIT 1 kit by Does not apply route 3 (three) times daily. Use as instructed to check blood sugar three times daily. E11.9 1 kit 0  . diclofenac (VOLTAREN) 75 MG EC tablet TAKE 1 TABLET BY MOUTH TWICE DAILY. 60 tablet 2  . furosemide (LASIX) 20 MG tablet TAKE 1 TABLET (20 MG TOTAL) BY MOUTH DAILY. AS NEEDED FOR LEG SWELLING 30 tablet 1  . Garlic 7124 MG CAPS Take by mouth.    Marland Kitchen glucose blood (ONETOUCH VERIO) test strip Use as instructed to check blood sugar three times daily. E11.9 100 each 12  . GNP LORATADINE 10 MG tablet TAKE 1 TABLET (10 MG TOTAL) BY MOUTH DAILY. 30 tablet 5  . meloxicam (MOBIC) 7.5 MG tablet TAKE 1 TABLET (7.5 MG TOTAL) BY MOUTH DAILY. 30 tablet 3  . Multiple Vitamin (MULTIVITAMIN WITH MINERALS) TABS tablet Take 1 tablet by mouth daily.    . nitroGLYCERIN (NITROSTAT) 0.4 MG SL tablet PLACE 1 TABLET (0.4 MG TOTAL) UNDER THE TONGUE EVERY 5 (FIVE) MINUTES AS NEEDED FOR CHEST PAIN. 25 tablet 1  . OneTouch Delica Lancets 58K MISC Use as instructed to check blood sugar three times daily.  E11.9 100 each 11  . atorvastatin (LIPITOR) 40 MG tablet Take 1 tablet (40 mg total) by mouth daily at 6 PM. 90 tablet 1  . cyclobenzaprine (FLEXERIL) 10 MG tablet Take 1 tablet (10 mg total) by mouth at bedtime. 30 tablet 1  . fluticasone (FLONASE) 50 MCG/ACT nasal spray Place 2 sprays into both nostrils daily. 16 g 6  . lisinopril-hydrochlorothiazide (ZESTORETIC) 20-12.5 MG tablet Take 2 tablets by mouth daily. Must keep upcoming office visit for refills 180 tablet 0  . metFORMIN (GLUCOPHAGE) 500 MG tablet TAKE 2 TABLETS (1,000 MG TOTAL) BY MOUTH 2 (TWO) TIMES DAILY WITH A MEAL. 360 tablet 1  . potassium chloride (KLOR-CON) 10 MEQ tablet Take 1 tablet (10 mEq total) by mouth daily. 90 tablet 1  . Apple  Cider Vinegar 500 MG TABS Take 500 mg by mouth one time only at 6 PM. (Patient not taking: Reported on 12/11/2020)    . Biotin 1000 MCG tablet Take 1,000 mcg by mouth 3 (three) times daily. (Patient not taking: No sig reported)     Facility-Administered Medications Prior to Visit  Medication Dose Route Frequency Provider Last Rate Last Admin  . 0.9 %  sodium chloride infusion  500 mL Intravenous Continuous Nandigam, Kavitha V, MD         ROS Review of Systems  Constitutional: Negative for activity change, appetite change and fatigue.  HENT: Negative for congestion, sinus pressure and sore throat.   Eyes: Negative for visual disturbance.  Respiratory: Negative for cough, chest tightness, shortness of breath and wheezing.   Cardiovascular: Negative for chest pain and palpitations.  Gastrointestinal: Negative for abdominal distention, abdominal pain and constipation.  Endocrine: Negative for polydipsia.  Genitourinary: Negative for dysuria and frequency.  Musculoskeletal: Negative for arthralgias and back pain.  Skin: Negative for rash.  Neurological: Negative for tremors, light-headedness and numbness.  Hematological: Does not bruise/bleed easily.  Psychiatric/Behavioral: Negative for agitation and behavioral problems.    Objective:  BP 121/79   Pulse 64   Ht 5' 6"  (1.676 m)   Wt 266 lb 12.8 oz (121 kg)   SpO2 98%   BMI 43.06 kg/m   BP/Weight 12/11/2020 16/09/958 12/10/4096  Systolic BP 119 - 147  Diastolic BP 79 - 86  Wt. (Lbs) 266.8 256 261.8  BMI 43.06 41.32 41      Physical Exam Constitutional:      Appearance: She is well-developed. She is obese.  Neck:     Vascular: No JVD.  Cardiovascular:     Rate and Rhythm: Normal rate.     Heart sounds: Normal heart sounds. No murmur heard.   Pulmonary:     Effort: Pulmonary effort is normal.     Breath sounds: Normal breath sounds. No wheezing or rales.  Chest:     Chest wall: No tenderness.  Abdominal:     General:  Bowel sounds are normal. There is no distension.     Palpations: Abdomen is soft. There is no mass.     Tenderness: There is no abdominal tenderness.  Musculoskeletal:     Right lower leg: No edema.     Left lower leg: No edema.     Comments: Crepitus on range of motion of both knees  Neurological:     Mental Status: She is alert and oriented to person, place, and time.  Psychiatric:        Mood and Affect: Mood normal.     CMP Latest Ref Rng & Units 04/11/2020  07/10/2019 06/15/2018  Glucose 65 - 99 mg/dL 101(H) 91 111(H)  BUN 8 - 27 mg/dL 11 10 18   Creatinine 0.57 - 1.00 mg/dL 0.69 0.54(L) 0.71  Sodium 134 - 144 mmol/L 141 139 143  Potassium 3.5 - 5.2 mmol/L 4.0 3.9 3.5  Chloride 96 - 106 mmol/L 100 99 96  CO2 20 - 29 mmol/L 27 30(H) 30(H)  Calcium 8.7 - 10.3 mg/dL 10.2 9.7 10.2  Total Protein 6.0 - 8.5 g/dL 7.3 7.0 7.2  Total Bilirubin 0.0 - 1.2 mg/dL 0.4 0.4 0.3  Alkaline Phos 48 - 121 IU/L 65 70 52  AST 0 - 40 IU/L 13 14 15   ALT 0 - 32 IU/L 14 12 14     Lipid Panel     Component Value Date/Time   CHOL 148 04/11/2020 0850   TRIG 79 04/11/2020 0850   HDL 54 04/11/2020 0850   CHOLHDL 2.7 04/11/2020 0850   CHOLHDL 2.6 03/11/2016 1043   VLDL 13 03/11/2016 1043   LDLCALC 79 04/11/2020 0850    CBC    Component Value Date/Time   WBC 4.2 05/20/2016 1525   RBC 4.42 05/20/2016 1525   HGB 12.2 05/20/2016 1525   HCT 38.0 05/20/2016 1525   PLT 181 05/20/2016 1525   MCV 86.0 05/20/2016 1525   MCH 27.6 05/20/2016 1525   MCHC 32.1 05/20/2016 1525   RDW 18.3 (H) 05/20/2016 1525   LYMPHSABS 1.5 06/18/2015 1815   MONOABS 0.8 06/18/2015 1815   EOSABS 0.1 06/18/2015 1815   BASOSABS 0.0 06/18/2015 1815    Lab Results  Component Value Date   HGBA1C 6.2 (A) 12/11/2020    Assessment & Plan:  1. Type 2 diabetes mellitus without complication, without long-term current use of insulin (H[facility]) Controlled with A1c of 6.2 Commenced on Ozempic which will be beneficial with regards to  weight loss Counseled on Diabetic diet, my plate method, 510 minutes of moderate intensity exercise/week Blood sugar logs with fasting goals of 80-120 mg/dl, random of less than 180 and in the event of sugars less than 60 mg/dl or greater than 400 mg/dl encouraged to notify the clinic. Advised on the need for annual eye exams, annual foot exams, Pneumonia vaccine. - POCT glucose (manual entry) - POCT glycosylated hemoglobin (Hb A1C) - metFORMIN (GLUCOPHAGE) 500 MG tablet; Take 2 tablets (1,000 mg total) by mouth 2 (two) times daily with a meal.  Dispense: 360 tablet; Refill: 1 - Semaglutide,0.25 or 0.5MG/DOS, (OZEMPIC, 0.25 OR 0.5 MG/DOSE,) 2 MG/1.5ML SOPN; Inject 0.5 mg into the skin once a week.  Dispense: 1.5 mL; Refill: 6 - Microalbumin / creatinine urine ratio - CMP14+EGFR - Lipid panel  2. Essential hypertension Controlled Counseled on blood pressure goal of less than 130/80, low-sodium, DASH diet, medication compliance, 150 minutes of moderate intensity exercise per week. Discussed medication compliance, adverse effects. - lisinopril-hydrochlorothiazide (ZESTORETIC) 20-12.5 MG tablet; Take 2 tablets by mouth daily. Must keep upcoming office visit for refills  Dispense: 180 tablet; Refill: 1  3. Hypokalemia Diuretic induced Stable - potassium chloride (KLOR-CON) 10 MEQ tablet; Take 1 tablet (10 mEq total) by mouth daily.  Dispense: 90 tablet; Refill: 1  4. Seasonal allergic rhinitis due to other allergic trigger Stable - fluticasone (FLONASE) 50 MCG/ACT nasal spray; Place 2 sprays into both nostrils daily.  Dispense: 16 g; Refill: 6  5. Pure hypercholesterolemia Controlled - atorvastatin (LIPITOR) 40 MG tablet; Take 1 tablet (40 mg total) by mouth daily at 6 PM.  Dispense: 90 tablet; Refill: 1  6. Primary osteoarthritis of right knee Stable on current regimen Weight loss will be beneficial    Meds ordered this encounter  Medications  . lisinopril-hydrochlorothiazide  (ZESTORETIC) 20-12.5 MG tablet    Sig: Take 2 tablets by mouth daily. Must keep upcoming office visit for refills    Dispense:  180 tablet    Refill:  1  . metFORMIN (GLUCOPHAGE) 500 MG tablet    Sig: Take 2 tablets (1,000 mg total) by mouth 2 (two) times daily with a meal.    Dispense:  360 tablet    Refill:  1  . potassium chloride (KLOR-CON) 10 MEQ tablet    Sig: Take 1 tablet (10 mEq total) by mouth daily.    Dispense:  90 tablet    Refill:  1  . fluticasone (FLONASE) 50 MCG/ACT nasal spray    Sig: Place 2 sprays into both nostrils daily.    Dispense:  16 g    Refill:  6  . atorvastatin (LIPITOR) 40 MG tablet    Sig: Take 1 tablet (40 mg total) by mouth daily at 6 PM.    Dispense:  90 tablet    Refill:  1  . Semaglutide,0.25 or 0.5MG/DOS, (OZEMPIC, 0.25 OR 0.5 MG/DOSE,) 2 MG/1.5ML SOPN    Sig: Inject 0.5 mg into the skin once a week.    Dispense:  1.5 mL    Refill:  6    Follow-up: Return in about 6 months (around 06/12/2021) for medical conditions.       Charlott Rakes, MD, FAAFP. [facility] [place], [place]   12/11/2020, 12:04 PM

## 2020-12-11 NOTE — Patient Instructions (Signed)
Calorie Counting for Weight Loss Calories are units of energy. Your body needs a certain number of calories from food to keep going throughout the day. When you eat or drink more calories than your body needs, your body stores the extra calories mostly as fat. When you eat or drink fewer calories than your body needs, your body burns fat to get the energy it needs. Calorie counting means keeping track of how many calories you eat and drink each day. Calorie counting can be helpful if you need to lose weight. If you eat fewer calories than your body needs, you should lose weight. Ask your health care provider what a healthy weight is for you. For calorie counting to work, you will need to eat the right number of calories each day to lose a healthy amount of weight per week. A dietitian can help you figure out how many calories you need in a day and will suggest ways to reach your calorie goal.  A healthy amount of weight to lose each week is usually 1-2 lb (0.5-0.9 kg). This usually means that your daily calorie intake should be reduced by 500-750 calories.  Eating 1,200-1,500 calories a day can help most women lose weight.  Eating 1,500-1,800 calories a day can help most men lose weight. What do I need to know about calorie counting? Work with your health care provider or dietitian to determine how many calories you should get each day. To meet your daily calorie goal, you will need to:  Find out how many calories are in each food that you would like to eat. Try to do this before you eat.  Decide how much of the food you plan to eat.  Keep a food log. Do this by writing down what you ate and how many calories it had. To successfully lose weight, it is important to balance calorie counting with a healthy lifestyle that includes regular activity. Where do I find calorie information? The number of calories in a food can be found on a Nutrition Facts label. If a food does not have a Nutrition Facts  label, try to look up the calories online or ask your dietitian for help. Remember that calories are listed per serving. If you choose to have more than one serving of a food, you will have to multiply the calories per serving by the number of servings you plan to eat. For example, the label on a package of bread might say that a serving size is 1 slice and that there are 90 calories in a serving. If you eat 1 slice, you will have eaten 90 calories. If you eat 2 slices, you will have eaten 180 calories.   How do I keep a food log? After each time that you eat, record the following in your food log as soon as possible:  What you ate. Be sure to include toppings, sauces, and other extras on the food.  How much you ate. This can be measured in cups, ounces, or number of items.  How many calories were in each food and drink.  The total number of calories in the food you ate. Keep your food log near you, such as in a pocket-sized notebook or on an app or website on your mobile phone. Some programs will calculate calories for you and show you how many calories you have left to meet your daily goal. What are some portion-control tips?  Know how many calories are in a serving. This will   help you know how many servings you can have of a certain food.  Use a measuring cup to measure serving sizes. You could also try weighing out portions on a kitchen scale. With time, you will be able to estimate serving sizes for some foods.  Take time to put servings of different foods on your favorite plates or in your favorite bowls and cups so you know what a serving looks like.  Try not to eat straight from a food's packaging, such as from a bag or box. Eating straight from the package makes it hard to see how much you are eating and can lead to overeating. Put the amount you would like to eat in a cup or on a plate to make sure you are eating the right portion.  Use smaller plates, glasses, and bowls for smaller  portions and to prevent overeating.  Try not to multitask. For example, avoid watching TV or using your computer while eating. If it is time to eat, sit down at a table and enjoy your food. This will help you recognize when you are full. It will also help you be more mindful of what and how much you are eating. What are tips for following this plan? Reading food labels  Check the calorie count compared with the serving size. The serving size may be smaller than what you are used to eating.  Check the source of the calories. Try to choose foods that are high in protein, fiber, and vitamins, and low in saturated fat, trans fat, and sodium. Shopping  Read nutrition labels while you shop. This will help you make healthy decisions about which foods to buy.  Pay attention to nutrition labels for low-fat or fat-free foods. These foods sometimes have the same number of calories or more calories than the full-fat versions. They also often have added sugar, starch, or salt to make up for flavor that was removed with the fat.  Make a grocery list of lower-calorie foods and stick to it. Cooking  Try to cook your favorite foods in a healthier way. For example, try baking instead of frying.  Use low-fat dairy products. Meal planning  Use more fruits and vegetables. One-half of your plate should be fruits and vegetables.  Include lean proteins, such as chicken, turkey, and fish. Lifestyle Each week, aim to do one of the following:  150 minutes of moderate exercise, such as walking.  75 minutes of vigorous exercise, such as running. General information  Know how many calories are in the foods you eat most often. This will help you calculate calorie counts faster.  Find a way of tracking calories that works for you. Get creative. Try different apps or programs if writing down calories does not work for you. What foods should I eat?  Eat nutritious foods. It is better to have a nutritious,  high-calorie food, such as an avocado, than a food with few nutrients, such as a bag of potato chips.  Use your calories on foods and drinks that will fill you up and will not leave you hungry soon after eating. ? Examples of foods that fill you up are nuts and nut butters, vegetables, lean proteins, and high-fiber foods such as whole grains. High-fiber foods are foods with more than 5 g of fiber per serving.  Pay attention to calories in drinks. Low-calorie drinks include water and unsweetened drinks. The items listed above may not be a complete list of foods and beverages you can eat.   Contact a dietitian for more information.   What foods should I limit? Limit foods or drinks that are not good sources of vitamins, minerals, or protein or that are high in unhealthy fats. These include:  Candy.  Other sweets.  Sodas, specialty coffee drinks, alcohol, and juice. The items listed above may not be a complete list of foods and beverages you should avoid. Contact a dietitian for more information. How do I count calories when eating out?  Pay attention to portions. Often, portions are much larger when eating out. Try these tips to keep portions smaller: ? Consider sharing a meal instead of getting your own. ? If you get your own meal, eat only half of it. Before you start eating, ask for a container and put half of your meal into it. ? When available, consider ordering smaller portions from the menu instead of full portions.  Pay attention to your food and drink choices. Knowing the way food is cooked and what is included with the meal can help you eat fewer calories. ? If calories are listed on the menu, choose the lower-calorie options. ? Choose dishes that include vegetables, fruits, whole grains, low-fat dairy products, and lean proteins. ? Choose items that are boiled, broiled, grilled, or steamed. Avoid items that are buttered, battered, fried, or served with cream sauce. Items labeled as  crispy are usually fried, unless stated otherwise. ? Choose water, low-fat milk, unsweetened iced tea, or other drinks without added sugar. If you want an alcoholic beverage, choose a lower-calorie option, such as a glass of wine or light beer. ? Ask for dressings, sauces, and syrups on the side. These are usually high in calories, so you should limit the amount you eat. ? If you want a salad, choose a garden salad and ask for grilled meats. Avoid extra toppings such as bacon, cheese, or fried items. Ask for the dressing on the side, or ask for olive oil and vinegar or lemon to use as dressing.  Estimate how many servings of a food you are given. Knowing serving sizes will help you be aware of how much food you are eating at restaurants. Where to find more information  Centers for Disease Control and Prevention: www.cdc.gov  [place] Department of Agriculture: myplate.gov Summary  Calorie counting means keeping track of how many calories you eat and drink each day. If you eat fewer calories than your body needs, you should lose weight.  A healthy amount of weight to lose per week is usually 1-2 lb (0.5-0.9 kg). This usually means reducing your daily calorie intake by 500-750 calories.  The number of calories in a food can be found on a Nutrition Facts label. If a food does not have a Nutrition Facts label, try to look up the calories online or ask your dietitian for help.  Use smaller plates, glasses, and bowls for smaller portions and to prevent overeating.  Use your calories on foods and drinks that will fill you up and not leave you hungry shortly after a meal. This information is not intended to replace advice given to you by your health care provider. Make sure you discuss any questions you have with your health care provider. Document Revised: 10/05/2019 Document Reviewed: 10/05/2019 Elsevier Patient Education  2021 Elsevier Inc.  

## 2020-12-12 LAB — CMP14+EGFR
ALT: 14 IU/L (ref 0–32)
AST: 14 IU/L (ref 0–40)
Albumin/Globulin Ratio: 1.6 (ref 1.2–2.2)
Albumin: 4.4 g/dL (ref 3.8–4.8)
Alkaline Phosphatase: 75 IU/L (ref 44–121)
BUN/Creatinine Ratio: 20 (ref 12–28)
BUN: 12 mg/dL (ref 8–27)
Bilirubin Total: 0.3 mg/dL (ref 0.0–1.2)
CO2: 27 mmol/L (ref 20–29)
Calcium: 9.8 mg/dL (ref 8.7–10.3)
Chloride: 98 mmol/L (ref 96–106)
Creatinine, Ser: 0.6 mg/dL (ref 0.57–1.00)
Globulin, Total: 2.8 g/dL (ref 1.5–4.5)
Glucose: 97 mg/dL (ref 65–99)
Potassium: 4 mmol/L (ref 3.5–5.2)
Sodium: 141 mmol/L (ref 134–144)
Total Protein: 7.2 g/dL (ref 6.0–8.5)
eGFR: 101 mL/min/{1.73_m2} (ref >59)

## 2020-12-12 LAB — LIPID PANEL
Chol/HDL Ratio: 2.7 ratio (ref 0.0–4.4)
Cholesterol, Total: 172 mg/dL (ref 100–199)
HDL: 63 mg/dL (ref >39)
LDL Chol Calc (NIH): 98 mg/dL (ref 0–99)
Triglycerides: 56 mg/dL (ref 0–149)
VLDL Cholesterol Cal: 11 mg/dL (ref 5–40)

## 2020-12-12 LAB — MICROALBUMIN / CREATININE URINE RATIO
Creatinine, Urine: 77.3 mg/dL
Microalb/Creat Ratio: <4 mg/g creat (ref 0–29)
Microalbumin, Urine: <3 ug/mL

## 2020-12-13 ENCOUNTER — Telehealth: Payer: Self-pay

## 2020-12-13 NOTE — Telephone Encounter (Signed)
Patient name and DOB has been verified Patient was informed of lab results. Patient had no questions.  

## 2020-12-13 NOTE — Telephone Encounter (Signed)
-----   Message from Charlott Rakes, MD sent at 12/12/2020  5:04 PM EDT ----- Please inform the patient that labs are normal. Thank you.

## 2021-01-10 ENCOUNTER — Ambulatory Visit: Payer: 59

## 2021-01-13 ENCOUNTER — Other Ambulatory Visit: Payer: Self-pay

## 2021-01-13 ENCOUNTER — Ambulatory Visit
Admission: RE | Admit: 2021-01-13 | Discharge: 2021-01-13 | Disposition: A | Discharge status: Home / Self Care | Payer: 59 | Source: Ambulatory Visit | Attending: Family Medicine | Admitting: Family Medicine

## 2021-01-17 ENCOUNTER — Other Ambulatory Visit: Payer: Self-pay | Admitting: Family Medicine

## 2021-01-17 DIAGNOSIS — R600 Localized edema: Secondary | ICD-10-CM

## 2021-02-25 ENCOUNTER — Other Ambulatory Visit: Payer: Self-pay | Admitting: Family Medicine

## 2021-02-25 DIAGNOSIS — R600 Localized edema: Secondary | ICD-10-CM

## 2021-03-30 ENCOUNTER — Other Ambulatory Visit: Payer: Self-pay | Admitting: Family Medicine

## 2021-03-30 DIAGNOSIS — R600 Localized edema: Secondary | ICD-10-CM

## 2021-03-30 NOTE — Telephone Encounter (Signed)
Requested Prescriptions  Pending Prescriptions Disp Refills  . furosemide (LASIX) 20 MG tablet [Pharmacy Med Name: Furosemide 20 MG Oral Tablet] 30 tablet 0    Sig: TAKE 1 TABLET BY MOUTH ONCE DAILY AS NEEDED FOR  SWELLING  LEG     Cardiovascular:  Diuretics - Loop Passed - 03/30/2021 12:42 PM      Passed - K in normal range and within 360 days    Potassium  Date Value Ref Range Status  12/11/2020 4.0 3.5 - 5.2 mmol/L Final         Passed - Ca in normal range and within 360 days    Calcium  Date Value Ref Range Status  12/11/2020 9.8 8.7 - 10.3 mg/dL Final   Calcium, Ion  Date Value Ref Range Status  07/29/2012 1.21 1.12 - 1.23 mmol/L Final         Passed - Na in normal range and within 360 days    Sodium  Date Value Ref Range Status  12/11/2020 141 134 - 144 mmol/L Final         Passed - Cr in normal range and within 360 days    Creat  Date Value Ref Range Status  09/21/2016 0.54 0.50 - 1.05 mg/dL Final    Comment:      For patients > or = 63 years of age: The upper reference limit for Creatinine is approximately 13% higher for people identified as African-American.      Creatinine, Ser  Date Value Ref Range Status  12/11/2020 0.60 0.57 - 1.00 mg/dL Final         Passed - Last BP in normal range    BP Readings from Last 1 Encounters:  12/11/20 121/79         Passed - Valid encounter within last 6 months    Recent Outpatient Visits          3 months ago Type 2 diabetes mellitus without complication, without long-term current use of insulin ([place])   [place], [place], MD   11 months ago Type 2 diabetes mellitus without complication, without long-term current use of insulin ([place])   [place], Charlane Ferretti, MD   1 year ago Pain of left deltoid   [place], Cari S, [place]   1 year ago Type 2 diabetes mellitus without complication, without long-term  current use of insulin ([place])   [place], [place], MD   1 year ago Type 2 diabetes mellitus without complication, without long-term current use of insulin ([place])   [facility] Charlott Rakes, MD

## 2021-04-01 ENCOUNTER — Telehealth: Payer: Self-pay | Admitting: Family Medicine

## 2021-04-01 NOTE — Telephone Encounter (Signed)
Will route to PCP for review. 

## 2021-04-01 NOTE — Telephone Encounter (Signed)
If rash has resolved I would recommend she continue taking Ozempic but if persistent she needs to stop it and schedule an appointment with [facility].  There should be no interaction between Ozempic and hydrochlorothiazide.

## 2021-04-01 NOTE — Telephone Encounter (Signed)
Pt is calling and started on ozempic in April 2022 and in June 2022 she notice rashes on her knee,elbow and shoulder. Pt brought some itching medication OTC and she is taking bp hctz medication , atorvastatin and asa. Pt said hctz should not be taken with ozempic she read this on google. Pt advise patient said the rashes are ok now

## 2021-04-02 NOTE — Telephone Encounter (Signed)
Pt was called and a VM was left informing her to call office and make appointment with Lurena Joiner if rash continues.

## 2021-05-02 ENCOUNTER — Other Ambulatory Visit: Payer: Self-pay | Admitting: Family Medicine

## 2021-05-02 DIAGNOSIS — R600 Localized edema: Secondary | ICD-10-CM

## 2021-05-30 ENCOUNTER — Other Ambulatory Visit: Payer: Self-pay | Admitting: Family Medicine

## 2021-05-30 DIAGNOSIS — R600 Localized edema: Secondary | ICD-10-CM

## 2021-06-17 ENCOUNTER — Other Ambulatory Visit: Payer: Self-pay | Admitting: Family Medicine

## 2021-06-17 DIAGNOSIS — I10 Essential (primary) hypertension: Secondary | ICD-10-CM

## 2021-06-18 NOTE — Telephone Encounter (Signed)
Requested Prescriptions  Pending Prescriptions Disp Refills  . lisinopril-hydrochlorothiazide (ZESTORETIC) 20-12.5 MG tablet [Pharmacy Med Name: Lisinopril-hydroCHLOROthiazide 20-12.5 MG Oral Tablet] 180 tablet 0    Sig: TAKE 2 TABLETS BY MOUTH ONCE DAILY **  MUST  KEEP  UPCOMING  OFFICE  VISIT  FOR  REFILLS**     Cardiovascular:  ACEI + Diuretic Combos Failed - 06/17/2021  7:46 PM      Failed - Na in normal range and within 180 days    Sodium  Date Value Ref Range Status  12/11/2020 141 134 - 144 mmol/L Final         Failed - K in normal range and within 180 days    Potassium  Date Value Ref Range Status  12/11/2020 4.0 3.5 - 5.2 mmol/L Final         Failed - Cr in normal range and within 180 days    Creat  Date Value Ref Range Status  09/21/2016 0.54 0.50 - 1.05 mg/dL Final    Comment:      For patients > or = 63 years of age: The upper reference limit for Creatinine is approximately 13% higher for people identified as African-American.      Creatinine, Ser  Date Value Ref Range Status  12/11/2020 0.60 0.57 - 1.00 mg/dL Final         Failed - Ca in normal range and within 180 days    Calcium  Date Value Ref Range Status  12/11/2020 9.8 8.7 - 10.3 mg/dL Final   Calcium, Ion  Date Value Ref Range Status  07/29/2012 1.21 1.12 - 1.23 mmol/L Final         Failed - Valid encounter within last 6 months    Recent Outpatient Visits          6 months ago Type 2 diabetes mellitus without complication, without long-term current use of insulin ([place])   [place], [place], MD   1 year ago Type 2 diabetes mellitus without complication, without long-term current use of insulin ([place])   [place], Charlane Ferretti, MD   1 year ago Pain of left deltoid   [place], Cari S, [place]   1 year ago Type 2 diabetes mellitus without complication, without long-term current use of  insulin ([place])   [place], [place], MD   1 year ago Type 2 diabetes mellitus without complication, without long-term current use of insulin ([place])   [place], MD      Future Appointments            In 1 month Charlott Rakes, MD [place] - Patient is not pregnant      Passed - Last BP in normal range    BP Readings from Last 1 Encounters:  12/11/20 121/79

## 2021-07-26 ENCOUNTER — Other Ambulatory Visit: Payer: Self-pay | Admitting: Family Medicine

## 2021-07-26 DIAGNOSIS — E876 Hypokalemia: Secondary | ICD-10-CM

## 2021-07-26 NOTE — Telephone Encounter (Signed)
Requested Prescriptions  Pending Prescriptions Disp Refills  . potassium chloride (KLOR-CON) 10 MEQ tablet [Pharmacy Med Name: Potassium Chloride ER 10 MEQ Oral Tablet Extended Release] 90 tablet 0    Sig: Take 1 tablet by mouth once daily     Endocrinology:  Minerals - Potassium Supplementation Passed - 07/26/2021 10:35 AM      Passed - K in normal range and within 360 days    Potassium  Date Value Ref Range Status  12/11/2020 4.0 3.5 - 5.2 mmol/L Final         Passed - Cr in normal range and within 360 days    Creat  Date Value Ref Range Status  09/21/2016 0.54 0.50 - 1.05 mg/dL Final    Comment:      For patients > or = 63 years of age: The upper reference limit for Creatinine is approximately 13% higher for people identified as African-American.      Creatinine, Ser  Date Value Ref Range Status  12/11/2020 0.60 0.57 - 1.00 mg/dL Final         Passed - Valid encounter within last 12 months    Recent Outpatient Visits          7 months ago Type 2 diabetes mellitus without complication, without long-term current use of insulin ([place])   [place], [place], MD   1 year ago Type 2 diabetes mellitus without complication, without long-term current use of insulin ([place])   [place], Charlane Ferretti, MD   1 year ago Pain of left deltoid   [place], Cari S, [place]   1 year ago Type 2 diabetes mellitus without complication, without long-term current use of insulin ([place])   [place], Charlane Ferretti, MD   2 years ago Type 2 diabetes mellitus without complication, without long-term current use of insulin ([place])   [place], MD      Future Appointments            In 1 week Charlott Rakes, MD [place]

## 2021-08-05 ENCOUNTER — Encounter: Payer: Self-pay | Admitting: Family Medicine

## 2021-08-05 ENCOUNTER — Other Ambulatory Visit: Payer: Self-pay

## 2021-08-05 ENCOUNTER — Ambulatory Visit: Payer: 59 | Attending: Family Medicine | Admitting: Family Medicine

## 2021-08-05 VITALS — BP 145/88 | HR 72 | Ht 67.0 in | Wt 267.2 lb

## 2021-08-05 DIAGNOSIS — R600 Localized edema: Secondary | ICD-10-CM

## 2021-08-05 DIAGNOSIS — Z23 Encounter for immunization: Secondary | ICD-10-CM

## 2021-08-05 DIAGNOSIS — E119 Type 2 diabetes mellitus without complications: Secondary | ICD-10-CM | POA: Diagnosis not present

## 2021-08-05 DIAGNOSIS — E876 Hypokalemia: Secondary | ICD-10-CM | POA: Diagnosis not present

## 2021-08-05 DIAGNOSIS — E7800 Pure hypercholesterolemia, unspecified: Secondary | ICD-10-CM | POA: Diagnosis not present

## 2021-08-05 DIAGNOSIS — I10 Essential (primary) hypertension: Secondary | ICD-10-CM | POA: Diagnosis not present

## 2021-08-05 DIAGNOSIS — M1711 Unilateral primary osteoarthritis, right knee: Secondary | ICD-10-CM

## 2021-08-05 LAB — POCT GLYCOSYLATED HEMOGLOBIN (HGB A1C): HbA1c, POC (controlled diabetic range): 6.1 % (ref 0.0–7.0)

## 2021-08-05 MED ORDER — ATORVASTATIN CALCIUM 40 MG PO TABS: 40.0000 mg | ORAL_TABLET | Freq: Every day | ORAL | 1 refills | Status: DC

## 2021-08-05 MED ORDER — POTASSIUM CHLORIDE ER 10 MEQ PO TBCR: 10.0000 meq | EXTENDED_RELEASE_TABLET | Freq: Every day | ORAL | 1 refills | Status: DC

## 2021-08-05 MED ORDER — OZEMPIC (0.25 OR 0.5 MG/DOSE) 2 MG/1.5ML ~~LOC~~ SOPN: 0.5000 mg | PEN_INJECTOR | SUBCUTANEOUS | 6 refills | Status: DC

## 2021-08-05 MED ORDER — DICLOFENAC SODIUM 75 MG PO TBEC: DELAYED_RELEASE_TABLET | Freq: Two times a day (BID) | ORAL | 1 refills | Status: AC

## 2021-08-05 MED ORDER — FUROSEMIDE 20 MG PO TABS: ORAL_TABLET | ORAL | 3 refills | Status: DC

## 2021-08-05 MED ORDER — LISINOPRIL-HYDROCHLOROTHIAZIDE 20-12.5 MG PO TABS: 2.0000 | ORAL_TABLET | Freq: Every day | ORAL | 1 refills | Status: DC

## 2021-08-05 MED ORDER — METFORMIN HCL 500 MG PO TABS: 1000.0000 mg | ORAL_TABLET | Freq: Two times a day (BID) | ORAL | 1 refills | Status: DC

## 2021-08-05 NOTE — Progress Notes (Signed)
Subjective:  Patient ID: Emily Dickson, female    DOB: 06-02-1956  Age: 65 y.o. MRN: [id number]  CC: Hypertension   HPI Emily Dickson is a 65 y.o. year old female with a history of hypertension, hyperlipidemia, chronic rhinitis, right knee pain, Type 2 DM (A1c 6.1).  Interval History: She has been out of Ozempic for some time.  Denies presence of hypoglycemia, numbness in extremities or visual concerns. She has been compliant with her antihypertensive but her blood pressure is slightly elevated.  She states at home her blood pressures have been normal and in comparison to previous visits her blood pressures have been normal.  She is also on potassium for hypokalemia. Doing well on her statin.  With regards to her right knee osteoarthritis she would like a prescription for diclofenac as meloxicam has been ineffective. Denies additional concerns.  Past Medical History:  Diagnosis Date   Allergy    seasonal   Arthritis    Back pain    Blood transfusion without reported diagnosis    with child birth   Diabetes mellitus without complication ([employer])    High cholesterol    Hypertension     History reviewed. No pertinent surgical history.  Family History  Problem Relation Age of Onset   Colon cancer Neg Hx    Colon polyps Neg Hx    Esophageal cancer Neg Hx    Stomach cancer Neg Hx    Rectal cancer Neg Hx     Allergies  Allergen Reactions   Penicillins Rash    Has patient had a PCN reaction causing immediate rash, facial/tongue/throat swelling, SOB or lightheadedness with hypotension: No Has patient had a PCN reaction causing severe rash involving mucus membranes or skin necrosis: No Has patient had a PCN reaction that required hospitalization No Has patient had a PCN reaction occurring within the last 10 years: No If all of the above answers are "NO", then may proceed with Cephalosporin use.    Outpatient Medications Prior to Visit  Medication Sig Dispense Refill    aspirin EC 81 MG tablet Take 1 tablet (81 mg total) by mouth daily. 90 tablet 1   Blood Glucose Monitoring Suppl (ONETOUCH VERIO REFLECT) w/Device KIT 1 kit by Does not apply route 3 (three) times daily. Use as instructed to check blood sugar three times daily. E11.9 1 kit 0   fluticasone (FLONASE) 50 MCG/ACT nasal spray Place 2 sprays into both nostrils daily. 16 g 6   Garlic 6295 MG CAPS Take by mouth.     glucose blood (ONETOUCH VERIO) test strip Use as instructed to check blood sugar three times daily. E11.9 100 each 12   GNP LORATADINE 10 MG tablet TAKE 1 TABLET (10 MG TOTAL) BY MOUTH DAILY. 30 tablet 5   Multiple Vitamin (MULTIVITAMIN WITH MINERALS) TABS tablet Take 1 tablet by mouth daily.     nitroGLYCERIN (NITROSTAT) 0.4 MG SL tablet PLACE 1 TABLET (0.4 MG TOTAL) UNDER THE TONGUE EVERY 5 (FIVE) MINUTES AS NEEDED FOR CHEST PAIN. 25 tablet 1   OneTouch Delica Lancets 28U MISC Use as instructed to check blood sugar three times daily. E11.9 100 each 11   atorvastatin (LIPITOR) 40 MG tablet Take 1 tablet (40 mg total) by mouth daily at 6 PM. 90 tablet 1   furosemide (LASIX) 20 MG tablet TAKE 1 TABLET BY MOUTH ONCE DAILY AS NEEDED FOR  SWELLING  LEG 30 tablet 1   lisinopril-hydrochlorothiazide (ZESTORETIC) 20-12.5 MG tablet TAKE 2 TABLETS  BY MOUTH ONCE DAILY **  MUST  KEEP  UPCOMING  OFFICE  VISIT  FOR  REFILLS** 180 tablet 0   metFORMIN (GLUCOPHAGE) 500 MG tablet Take 2 tablets (1,000 mg total) by mouth 2 (two) times daily with a meal. 360 tablet 1   potassium chloride (KLOR-CON) 10 MEQ tablet Take 1 tablet by mouth once daily 90 tablet 0   Semaglutide,0.25 or 0.5MG/DOS, (OZEMPIC, 0.25 OR 0.5 MG/DOSE,) 2 MG/1.5ML SOPN Inject 0.5 mg into the skin once a week. 1.5 mL 6   Apple Cider Vinegar 500 MG TABS Take 500 mg by mouth one time only at 6 PM. (Patient not taking: Reported on 12/11/2020)     Biotin 1000 MCG tablet Take 1,000 mcg by mouth 3 (three) times daily. (Patient not taking: Reported on  05/08/2020)     diclofenac (VOLTAREN) 75 MG EC tablet TAKE 1 TABLET BY MOUTH TWICE DAILY. (Patient not taking: Reported on 08/05/2021) 60 tablet 2   meloxicam (MOBIC) 7.5 MG tablet TAKE 1 TABLET (7.5 MG TOTAL) BY MOUTH DAILY. (Patient not taking: Reported on 08/05/2021) 30 tablet 3   Facility-Administered Medications Prior to Visit  Medication Dose Route Frequency Provider Last Rate Last Admin   0.9 %  sodium chloride infusion  500 mL Intravenous Continuous Nandigam, Kavitha V, MD         ROS Review of Systems  Constitutional:  Negative for activity change, appetite change and fatigue.  HENT:  Negative for congestion, sinus pressure and sore throat.   Eyes:  Negative for visual disturbance.  Respiratory:  Negative for cough, chest tightness, shortness of breath and wheezing.   Cardiovascular:  Negative for chest pain and palpitations.  Gastrointestinal:  Negative for abdominal distention, abdominal pain and constipation.  Endocrine: Negative for polydipsia.  Genitourinary:  Negative for dysuria and frequency.  Musculoskeletal:        Right knee pain  Skin:  Negative for rash.  Neurological:  Negative for tremors, light-headedness and numbness.  Hematological:  Does not bruise/bleed easily.  Psychiatric/Behavioral:  Negative for agitation and behavioral problems.    Objective:  BP (!) 145/88   Pulse 72   Ht 5' 7"  (1.702 m)   Wt 267 lb 3.2 oz (121.2 kg)   SpO2 99%   BMI 41.85 kg/m   BP/Weight 08/05/2021 12/11/2020 33/10/9516  Systolic BP 841 660 -  Diastolic BP 88 79 -  Wt. (Lbs) 267.2 266.8 256  BMI 41.85 43.06 41.32      Physical Exam Constitutional:      Appearance: She is well-developed.  Cardiovascular:     Rate and Rhythm: Normal rate.     Heart sounds: Normal heart sounds. No murmur heard. Pulmonary:     Effort: Pulmonary effort is normal.     Breath sounds: Normal breath sounds. No wheezing or rales.  Chest:     Chest wall: No tenderness.  Abdominal:      General: Bowel sounds are normal. There is no distension.     Palpations: Abdomen is soft. There is no mass.     Tenderness: There is no abdominal tenderness.  Musculoskeletal:        General: Normal range of motion.     Right lower leg: No edema.     Left lower leg: No edema.  Neurological:     Mental Status: She is alert and oriented to person, place, and time.  Psychiatric:        Mood and Affect: Mood normal.   CMP  Latest Ref Rng & Units 12/11/2020 04/11/2020 07/10/2019  Glucose 65 - 99 mg/dL 97 101(H) 91  BUN 8 - 27 mg/dL 12 11 10   Creatinine 0.57 - 1.00 mg/dL 0.60 0.69 0.54(L)  Sodium 134 - 144 mmol/L 141 141 139  Potassium 3.5 - 5.2 mmol/L 4.0 4.0 3.9  Chloride 96 - 106 mmol/L 98 100 99  CO2 20 - 29 mmol/L 27 27 30(H)  Calcium 8.7 - 10.3 mg/dL 9.8 10.2 9.7  Total Protein 6.0 - 8.5 g/dL 7.2 7.3 7.0  Total Bilirubin 0.0 - 1.2 mg/dL 0.3 0.4 0.4  Alkaline Phos 44 - 121 IU/L 75 65 70  AST 0 - 40 IU/L 14 13 14   ALT 0 - 32 IU/L 14 14 12     Lipid Panel     Component Value Date/Time   CHOL 172 12/11/2020 1017   TRIG 56 12/11/2020 1017   HDL 63 12/11/2020 1017   CHOLHDL 2.7 12/11/2020 1017   CHOLHDL 2.6 03/11/2016 1043   VLDL 13 03/11/2016 1043   LDLCALC 98 12/11/2020 1017    CBC    Component Value Date/Time   WBC 4.2 05/20/2016 1525   RBC 4.42 05/20/2016 1525   HGB 12.2 05/20/2016 1525   HCT 38.0 05/20/2016 1525   PLT 181 05/20/2016 1525   MCV 86.0 05/20/2016 1525   MCH 27.6 05/20/2016 1525   MCHC 32.1 05/20/2016 1525   RDW 18.3 (H) 05/20/2016 1525   LYMPHSABS 1.5 06/18/2015 1815   MONOABS 0.8 06/18/2015 1815   EOSABS 0.1 06/18/2015 1815   BASOSABS 0.0 06/18/2015 1815    Lab Results  Component Value Date   HGBA1C 6.1 08/05/2021    Assessment & Plan:  1. Type 2 diabetes mellitus without complication, without long-term current use of insulin (H[facility]) Controlled with A1c of 6.1 Restarted Ozempic Continue metformin Counseled on Diabetic diet, my plate method, 494  minutes of moderate intensity exercise/week Blood sugar logs with fasting goals of 80-120 mg/dl, random of less than 180 and in the event of sugars less than 60 mg/dl or greater than 400 mg/dl encouraged to notify the clinic. Advised on the need for annual eye exams, annual foot exams, Pneumonia vaccine. - POCT glycosylated hemoglobin (Hb A1C) - metFORMIN (GLUCOPHAGE) 500 MG tablet; Take 2 tablets (1,000 mg total) by mouth 2 (two) times daily with a meal.  Dispense: 360 tablet; Refill: 1 - Semaglutide,0.25 or 0.5MG/DOS, (OZEMPIC, 0.25 OR 0.5 MG/DOSE,) 2 MG/1.5ML SOPN; Inject 0.5 mg into the skin once a week.  Dispense: 1.5 mL; Refill: 6 - CMP14+EGFR  2. Pure hypercholesterolemia Controlled Low-cholesterol diet - atorvastatin (LIPITOR) 40 MG tablet; Take 1 tablet (40 mg total) by mouth daily at 6 PM.  Dispense: 90 tablet; Refill: 1  3. Essential hypertension Elevated blood pressure Ambulatory blood pressures are normal per patient we will hold off on making any regimen changes today Counseled on blood pressure goal of less than 130/80, low-sodium, DASH diet, medication compliance, 150 minutes of moderate intensity exercise per week. Discussed medication compliance, adverse effects. - lisinopril-hydrochlorothiazide (ZESTORETIC) 20-12.5 MG tablet; Take 2 tablets by mouth daily.  Dispense: 180 tablet; Refill: 1  4. Hypokalemia - potassium chloride (KLOR-CON) 10 MEQ tablet; Take 1 tablet (10 mEq total) by mouth daily.  Dispense: 90 tablet; Refill: 1  5. Pedal edema - furosemide (LASIX) 20 MG tablet; TAKE 1 TABLET BY MOUTH ONCE DAILY AS NEEDED FOR  SWELLING  LEG  Dispense: 30 tablet; Refill: 3  6. Need for pneumococcal vaccine -  Pneumococcal conjugate vaccine 20-valent  7. Primary osteoarthritis of right knee Uncontrolled on Meloxicam - diclofenac (VOLTAREN) 75 MG EC tablet; TAKE 1 TABLET BY MOUTH TWICE DAILY.  Dispense: 180 tablet; Refill: 1  8. Need for immunization against influenza -  Flu Vaccine QUAD 56moIM (Fluarix, Fluzone & Alfiuria Quad PF)    Meds ordered this encounter  Medications   atorvastatin (LIPITOR) 40 MG tablet    Sig: Take 1 tablet (40 mg total) by mouth daily at 6 PM.    Dispense:  90 tablet    Refill:  1   diclofenac (VOLTAREN) 75 MG EC tablet    Sig: TAKE 1 TABLET BY MOUTH TWICE DAILY.    Dispense:  180 tablet    Refill:  1   lisinopril-hydrochlorothiazide (ZESTORETIC) 20-12.5 MG tablet    Sig: Take 2 tablets by mouth daily.    Dispense:  180 tablet    Refill:  1   metFORMIN (GLUCOPHAGE) 500 MG tablet    Sig: Take 2 tablets (1,000 mg total) by mouth 2 (two) times daily with a meal.    Dispense:  360 tablet    Refill:  1   potassium chloride (KLOR-CON) 10 MEQ tablet    Sig: Take 1 tablet (10 mEq total) by mouth daily.    Dispense:  90 tablet    Refill:  1   Semaglutide,0.25 or 0.5MG/DOS, (OZEMPIC, 0.25 OR 0.5 MG/DOSE,) 2 MG/1.5ML SOPN    Sig: Inject 0.5 mg into the skin once a week.    Dispense:  1.5 mL    Refill:  6   furosemide (LASIX) 20 MG tablet    Sig: TAKE 1 TABLET BY MOUTH ONCE DAILY AS NEEDED FOR  SWELLING  LEG    Dispense:  30 tablet    Refill:  3     Follow-up: Return in about 1 month (around 09/04/2021) for PAP smear.       ECharlott Rakes MD, FAAFP. [facility]  08/05/2021, 6:16 PM

## 2021-08-06 LAB — CMP14+EGFR
ALT: 22 IU/L (ref 0–32)
AST: 24 IU/L (ref 0–40)
Albumin/Globulin Ratio: 1.4 (ref 1.2–2.2)
Albumin: 4.4 g/dL (ref 3.8–4.8)
Alkaline Phosphatase: 72 IU/L (ref 44–121)
BUN/Creatinine Ratio: 21 (ref 12–28)
BUN: 14 mg/dL (ref 8–27)
Bilirubin Total: 0.4 mg/dL (ref 0.0–1.2)
CO2: 26 mmol/L (ref 20–29)
Calcium: 9.9 mg/dL (ref 8.7–10.3)
Chloride: 101 mmol/L (ref 96–106)
Creatinine, Ser: 0.67 mg/dL (ref 0.57–1.00)
Globulin, Total: 3.1 g/dL (ref 1.5–4.5)
Glucose: 93 mg/dL (ref 70–99)
Potassium: 4.1 mmol/L (ref 3.5–5.2)
Sodium: 142 mmol/L (ref 134–144)
Total Protein: 7.5 g/dL (ref 6.0–8.5)
eGFR: 98 mL/min/{1.73_m2} (ref >59)

## 2021-09-04 ENCOUNTER — Encounter: Payer: Self-pay | Admitting: Family Medicine

## 2021-09-04 ENCOUNTER — Ambulatory Visit: Payer: 59 | Attending: Family Medicine | Admitting: Family Medicine

## 2021-09-04 ENCOUNTER — Other Ambulatory Visit: Payer: Self-pay

## 2021-09-04 ENCOUNTER — Other Ambulatory Visit (HOSPITAL_COMMUNITY)
Admission: RE | Admit: 2021-09-04 | Discharge: 2021-09-04 | Disposition: A | Discharge status: Home / Self Care | Payer: 59 | Source: Ambulatory Visit | Attending: Family Medicine | Admitting: Family Medicine

## 2021-09-04 VITALS — BP 176/103 | HR 83 | Ht 66.0 in | Wt 268.2 lb

## 2021-09-04 DIAGNOSIS — I10 Essential (primary) hypertension: Secondary | ICD-10-CM

## 2021-09-04 DIAGNOSIS — Z124 Encounter for screening for malignant neoplasm of cervix: Secondary | ICD-10-CM | POA: Diagnosis not present

## 2021-09-04 DIAGNOSIS — Z0000 Encounter for general adult medical examination without abnormal findings: Secondary | ICD-10-CM

## 2021-09-04 NOTE — Progress Notes (Signed)
Pap Smear.

## 2021-09-04 NOTE — Progress Notes (Signed)
Subjective:  Patient ID: Emily Dickson, female    DOB: [birth year]  Age: 65 y.o. MRN: [id number]  CC: Gynecologic Exam   HPI Emily Dickson is a 65 y.o. year old female with a history of hypertension, hyperlipidemia, chronic rhinitis, right knee pain, Type 2 DM (A1c 6.1). She presents for gynecological exam. Mammogram from 01/2021 was negative for malignancy; colonoscopy from 12/2019 was normal with next due in 12/2026.  Interval History: Her BP is elevated and she is yet to take her antihypertensive because she is yet to have breakfast. She is due for Pap smear today. Past Medical History:  Diagnosis Date   Allergy    seasonal   Arthritis    Back pain    Blood transfusion without reported diagnosis    with child birth   Diabetes mellitus without complication ([place])    High cholesterol    Hypertension     No past surgical history on file.  Family History  Problem Relation Age of Onset   Colon cancer Neg Hx    Colon polyps Neg Hx    Esophageal cancer Neg Hx    Stomach cancer Neg Hx    Rectal cancer Neg Hx     Allergies  Allergen Reactions   Penicillins Rash    Has patient had a PCN reaction causing immediate rash, facial/tongue/throat swelling, SOB or lightheadedness with hypotension: No Has patient had a PCN reaction causing severe rash involving mucus membranes or skin necrosis: No Has patient had a PCN reaction that required hospitalization No Has patient had a PCN reaction occurring within the last 10 years: No If all of the above answers are "NO", then may proceed with Cephalosporin use.    Outpatient Medications Prior to Visit  Medication Sig Dispense Refill   Apple Cider Vinegar 500 MG TABS Take 500 mg by mouth one time only at 6 PM.     aspirin EC 81 MG tablet Take 1 tablet (81 mg total) by mouth daily. 90 tablet 1   atorvastatin (LIPITOR) 40 MG tablet Take 1 tablet (40 mg total) by mouth daily at 6 PM. 90 tablet 1   Biotin 1000 MCG tablet Take 1,000  mcg by mouth 3 (three) times daily.     Blood Glucose Monitoring Suppl (ONETOUCH VERIO REFLECT) w/Device KIT 1 kit by Does not apply route 3 (three) times daily. Use as instructed to check blood sugar three times daily. E11.9 1 kit 0   diclofenac (VOLTAREN) 75 MG EC tablet TAKE 1 TABLET BY MOUTH TWICE DAILY. 180 tablet 1   fluticasone (FLONASE) 50 MCG/ACT nasal spray Place 2 sprays into both nostrils daily. 16 g 6   furosemide (LASIX) 20 MG tablet TAKE 1 TABLET BY MOUTH ONCE DAILY AS NEEDED FOR  SWELLING  LEG 30 tablet 3   Garlic 4268 MG CAPS Take by mouth.     glucose blood (ONETOUCH VERIO) test strip Use as instructed to check blood sugar three times daily. E11.9 100 each 12   GNP LORATADINE 10 MG tablet TAKE 1 TABLET (10 MG TOTAL) BY MOUTH DAILY. 30 tablet 5   lisinopril-hydrochlorothiazide (ZESTORETIC) 20-12.5 MG tablet Take 2 tablets by mouth daily. 180 tablet 1   metFORMIN (GLUCOPHAGE) 500 MG tablet Take 2 tablets (1,000 mg total) by mouth 2 (two) times daily with a meal. 360 tablet 1   Multiple Vitamin (MULTIVITAMIN WITH MINERALS) TABS tablet Take 1 tablet by mouth daily.     nitroGLYCERIN (NITROSTAT) 0.4 MG SL tablet  PLACE 1 TABLET (0.4 MG TOTAL) UNDER THE TONGUE EVERY 5 (FIVE) MINUTES AS NEEDED FOR CHEST PAIN. 25 tablet 1   OneTouch Delica Lancets 24Q MISC Use as instructed to check blood sugar three times daily. E11.9 100 each 11   potassium chloride (KLOR-CON) 10 MEQ tablet Take 1 tablet (10 mEq total) by mouth daily. 90 tablet 1   Semaglutide,0.25 or 0.5MG/DOS, (OZEMPIC, 0.25 OR 0.5 MG/DOSE,) 2 MG/1.5ML SOPN Inject 0.5 mg into the skin once a week. 1.5 mL 6   Facility-Administered Medications Prior to Visit  Medication Dose Route Frequency Provider Last Rate Last Admin   0.9 %  sodium chloride infusion  500 mL Intravenous Continuous Nandigam, Kavitha V, MD         ROS Review of Systems  Constitutional:  Negative for activity change, appetite change and fatigue.  HENT:  Negative  for congestion, sinus pressure and sore throat.   Eyes:  Negative for visual disturbance.  Respiratory:  Negative for cough, chest tightness, shortness of breath and wheezing.   Cardiovascular:  Negative for chest pain and palpitations.  Gastrointestinal:  Negative for abdominal distention, abdominal pain and constipation.  Endocrine: Negative for polydipsia.  Genitourinary:  Negative for dysuria and frequency.  Musculoskeletal:  Negative for arthralgias and back pain.  Skin:  Negative for rash.  Neurological:  Negative for tremors, light-headedness and numbness.  Hematological:  Does not bruise/bleed easily.  Psychiatric/Behavioral:  Negative for agitation and behavioral problems.    Objective:  BP (!) 176/103 Comment: no medication this morning   Pulse 83    Ht 5' 6"  (1.676 m)    Wt 268 lb 3.2 oz (121.7 kg)    SpO2 98%    BMI 43.29 kg/m   BP/Weight 09/04/2021 68/34/1962 10/09/9796  Systolic BP 921 194 174  Diastolic BP 081 88 79  Wt. (Lbs) 268.2 267.2 266.8  BMI 43.29 41.85 43.06      Physical Exam Exam conducted with a chaperone present.  Constitutional:      General: She is not in acute distress.    Appearance: She is well-developed. She is not diaphoretic.  HENT:     Head: Normocephalic.     Right Ear: External ear normal.     Left Ear: External ear normal.     Nose: Nose normal.  Eyes:     Conjunctiva/sclera: Conjunctivae normal.     Pupils: Pupils are equal, round, and reactive to light.  Neck:     Vascular: No JVD.  Cardiovascular:     Rate and Rhythm: Normal rate and regular rhythm.     Heart sounds: Normal heart sounds. No murmur heard.   No gallop.  Pulmonary:     Effort: Pulmonary effort is normal. No respiratory distress.     Breath sounds: Normal breath sounds. No wheezing or rales.  Chest:     Chest wall: No tenderness.  Breasts:    Right: Normal. No mass, nipple discharge or tenderness.     Left: Normal. No mass, nipple discharge or tenderness.   Abdominal:     General: Bowel sounds are normal. There is no distension.     Palpations: Abdomen is soft. There is no mass.     Tenderness: There is no abdominal tenderness.     Hernia: There is no hernia in the left inguinal area or right inguinal area.  Genitourinary:    General: Normal vulva.     Pubic Area: No rash.      Labia:  Right: No rash.        Left: No rash.      Vagina: Normal.     Cervix: Normal.     Uterus: Normal.      Adnexa: Right adnexa normal and left adnexa normal.       Right: No tenderness.         Left: No tenderness.    Musculoskeletal:        General: No tenderness. Normal range of motion.     Cervical back: Normal range of motion. No tenderness.  Lymphadenopathy:     Upper Body:     Right upper body: No supraclavicular or axillary adenopathy.     Left upper body: No supraclavicular or axillary adenopathy.  Skin:    General: Skin is warm and dry.  Neurological:     Mental Status: She is alert and oriented to person, place, and time.     Deep Tendon Reflexes: Reflexes are normal and symmetric.    CMP Latest Ref Rng & Units 08/05/2021 12/11/2020 04/11/2020  Glucose 70 - 99 mg/dL 93 97 101(H)  BUN 8 - 27 mg/dL 14 12 11   Creatinine 0.57 - 1.00 mg/dL 0.67 0.60 0.69  Sodium 134 - 144 mmol/L 142 141 141  Potassium 3.5 - 5.2 mmol/L 4.1 4.0 4.0  Chloride 96 - 106 mmol/L 101 98 100  CO2 20 - 29 mmol/L 26 27 27   Calcium 8.7 - 10.3 mg/dL 9.9 9.8 10.2  Total Protein 6.0 - 8.5 g/dL 7.5 7.2 7.3  Total Bilirubin 0.0 - 1.2 mg/dL 0.4 0.3 0.4  Alkaline Phos 44 - 121 IU/L 72 75 65  AST 0 - 40 IU/L 24 14 13   ALT 0 - 32 IU/L 22 14 14     Lipid Panel     Component Value Date/Time   CHOL 172 12/11/2020 1017   TRIG 56 12/11/2020 1017   HDL 63 12/11/2020 1017   CHOLHDL 2.7 12/11/2020 1017   CHOLHDL 2.6 03/11/2016 1043   VLDL 13 03/11/2016 1043   LDLCALC 98 12/11/2020 1017    CBC    Component Value Date/Time   WBC 4.2 05/20/2016 1525   RBC 4.42  05/20/2016 1525   HGB 12.2 05/20/2016 1525   HCT 38.0 05/20/2016 1525   PLT 181 05/20/2016 1525   MCV 86.0 05/20/2016 1525   MCH 27.6 05/20/2016 1525   MCHC 32.1 05/20/2016 1525   RDW 18.3 (H) 05/20/2016 1525   LYMPHSABS 1.5 06/18/2015 1815   MONOABS 0.8 06/18/2015 1815   EOSABS 0.1 06/18/2015 1815   BASOSABS 0.0 06/18/2015 1815    Lab Results  Component Value Date   HGBA1C 6.1 08/05/2021    Assessment & Plan:  1. Annual physical exam Counseled on 150 minutes of exercise per week, healthy eating (including decreased daily intake of saturated fats, cholesterol, added sugars, sodium), STI prevention, routine healthcare maintenance.   2. Screening for cervical cancer - Cytology - PAP  3. Essential hypertension Uncontrolled due to the fact that she is yet to take antihypertensive Encouraged to take medications when she gets home I will make no regimen change today. Counseled on blood pressure goal of less than 130/80, low-sodium, DASH diet, medication compliance, 150 minutes of moderate intensity exercise per week. Discussed medication compliance, adverse effects.   No orders of the defined types were placed in this encounter.   Follow-up: Return in about 3 months (around 12/03/2021) for Chronic medical conditions.       Charlott Rakes, MD,  FAAFP. [facility] [place], [place]   09/04/2021, 1:47 PM

## 2021-09-09 LAB — CYTOLOGY - PAP
Comment: NEGATIVE
Diagnosis: NEGATIVE
High risk HPV: NEGATIVE

## 2021-09-17 ENCOUNTER — Telehealth: Payer: Self-pay

## 2021-09-17 NOTE — Telephone Encounter (Signed)
Pt was called and a VM was left informing patient that her results has been uploaded to her mychart and to reach out to office if she has any questions.

## 2021-09-17 NOTE — Telephone Encounter (Signed)
-----   Message from Charlott Rakes, MD sent at 09/09/2021  1:30 PM EST ----- Please inform her that her PAP smear is normal

## 2021-10-14 ENCOUNTER — Other Ambulatory Visit: Payer: Self-pay

## 2021-10-14 ENCOUNTER — Telehealth: Payer: Self-pay

## 2021-10-14 NOTE — Telephone Encounter (Signed)
PA for Ozempic has been approved until 10/14/2022.  Pharmacy has been notified.

## 2021-12-16 ENCOUNTER — Telehealth: Payer: Self-pay | Admitting: Pharmacist

## 2021-12-16 DIAGNOSIS — I10 Essential (primary) hypertension: Secondary | ICD-10-CM

## 2021-12-16 NOTE — Patient Instructions (Addendum)
Emily Dickson,  ? ?It was great talking with you today! ? ?Check your blood pressure at least weekly . Our goal is less than 130/80 ? ?We recommend a blood pressure cuff that goes around your upper arm, as these are generally the most accurate.  ? ?To appropriately check your blood pressure, make sure you do the following:  ?1) Avoid caffeine, exercise, or tobacco products for 30 minutes before checking. ?2) Sit with your back supported in a flat-backed chair. Rest your arm on something flat (arm of the chair, table, etc). ?3) Sit still with your feet flat on the floor, resting, for at least 5 minutes.  ?4) Check your blood pressure. Take 1-2 readings.  ? ?Write down these readings and bring with you to any provider appointments. Bring your home blood pressure machine with you to a provider's office for accuracy comparison at least once a year.] ? ?Please make sure you take your blood pressure medications BEFORE you come to the office for an appointment. ? ?Please let us know if you have any questions. Thanks! ? ?Catie Hedwig Morton, PharmD, BCACP ?[place] ?[phone] ? ? ?

## 2021-12-16 NOTE — Patient Outreach (Signed)
Patient appearing on report for [place] [facility] - Hypertension Control report due to last documented ambulatory blood pressure of 176/103 on 09/04/2021. Next appointment with PCP is not scheduled.   ? ?Outreached patient to discuss hypertension control and medication management.  ? ?Current medications: lisinopril/HCTZ 20/12.5 mg - 2 tablets daily ? ?Patient has an automated upper arm home BP machine. ? ?Current blood pressure readings: 130s/80s ? ?Patient denies hypotensive signs and symptoms including dizziness, lightheadedness.  ?Patient denies hypertensive symptoms including headache, chest pain, shortness of breath ? ?Patient denies side effects related to any medications  ? ?Medications Reviewed Today   ? ? Reviewed by Osker Mason, RPH-CPP (Pharmacist) on 12/16/21 at 1449  Med List Status: <None>  ? ?Medication Order Taking? Sig Documenting Provider Last Dose Status Informant  ?0.9 %  sodium chloride infusion [id number]   Mauri Pole, MD  Active   ?Apple Cider Vinegar 500 MG TABS [id number] Yes Take 500 mg by mouth one time only at 6 PM. [provider] Taking Active   ?aspirin EC 81 MG tablet [id number] Yes Take 1 tablet (81 mg total) by mouth daily. Charlott Rakes, MD Taking Active   ?atorvastatin (LIPITOR) 40 MG tablet [id number] Yes Take 1 tablet (40 mg total) by mouth daily at 6 PM. Charlott Rakes, MD Taking Active   ?Blood Glucose Monitoring Suppl (ONETOUCH VERIO REFLECT) w/Device KIT [id number] Yes 1 kit by Does not apply route 3 (three) times daily. Use as instructed to check blood sugar three times daily. E11.9 Charlott Rakes, MD Taking Active   ?diclofenac (VOLTAREN) 75 MG EC tablet [id number] Yes TAKE 1 TABLET BY MOUTH TWICE DAILY. Charlott Rakes, MD Taking Active   ?fluticasone (FLONASE) 50 MCG/ACT nasal spray [id number] Yes Place 2 sprays into both nostrils daily. Charlott Rakes, MD Taking Active   ?furosemide (LASIX) 20 MG tablet [id number] Yes TAKE 1 TABLET BY MOUTH ONCE  DAILY AS NEEDED FOR  SWELLING  LEG Charlott Rakes, MD Taking Active   ?Garlic 1655 MG CAPS [id number] Yes Take by mouth. [provider] Taking Active   ?glucose blood (ONETOUCH VERIO) test strip [id number] Yes Use as instructed to check blood sugar three times daily. E11.9 Charlott Rakes, MD Taking Active   ?GNP LORATADINE 10 MG tablet [id number] Yes TAKE 1 TABLET (10 MG TOTAL) BY MOUTH DAILY. Charlott Rakes, MD Taking Active   ?lisinopril-hydrochlorothiazide (ZESTORETIC) 20-12.5 MG tablet [id number] Yes Take 2 tablets by mouth daily. Charlott Rakes, MD Taking Active   ?metFORMIN (GLUCOPHAGE) 500 MG tablet [id number] Yes Take 2 tablets (1,000 mg total) by mouth 2 (two) times daily with a meal. Charlott Rakes, MD Taking Active   ?Multiple Vitamin (MULTIVITAMIN WITH MINERALS) TABS tablet [id number] Yes Take 1 tablet by mouth daily. Barton Dubois, MD Taking Active Self  ?nitroGLYCERIN (NITROSTAT) 0.4 MG SL tablet [id number]  PLACE 1 TABLET (0.4 MG TOTAL) UNDER THE TONGUE EVERY 5 (FIVE) MINUTES AS NEEDED FOR CHEST PAIN. Charlott Rakes, MD  Active   ?OneTouch Delica Lancets 92T MISC [id number] Yes Use as instructed to check blood sugar three times daily. E11.9 Charlott Rakes, MD Taking Active   ?potassium chloride (KLOR-CON) 10 MEQ tablet [id number] Yes Take 1 tablet (10 mEq total) by mouth daily. Charlott Rakes, MD Taking Active   ?Semaglutide,0.25 or 0.5MG/DOS, (OZEMPIC, 0.25 OR 0.5 MG/DOSE,) 2 MG/1.5ML SOPN [id number] Yes Inject 0.5 mg into the skin once a week. Charlott Rakes, MD Taking Active   ? ?  ?  ? ?  ? ? ? ?  Assessment/Plan: ?- Currently controlled at home, over due for clinic follow up ?- Reviewed goal blood pressure <130/80 ?- Counseled on long term microvascular and macrovascular complications of uncontrolled hypertension ?- Reviewed appropriate home BP monitoring technique (avoid caffeine, smoking, and exercise for 30 minutes before checking, rest for at least 5 minutes before taking BP, sit with  feet flat on the floor and back against a hard surface, uncross legs, and rest arm on flat surface) ?- Reviewed to check blood pressure weekly, document, and provide at next provider visit ?- Overdue for PCP follow up. Scheduled for first available in July.  ?- Amenable to pharmacy follow up for in-office BP check. Scheduled in ~ 3 weeks.  ? ?Catie Hedwig Morton, PharmD, BCACP ?[place] ?[phone] ? ?

## 2021-12-17 ENCOUNTER — Other Ambulatory Visit: Payer: Self-pay | Admitting: Family Medicine

## 2021-12-17 IMAGING — MG MM DIGITAL SCREENING BILAT W/ TOMO AND CAD
8 of 14 series · 8 of 40 positions shown · non-contrast
Comparison: Previous exam(s).

CLINICAL DATA: Screening.

EXAM:
DIGITAL SCREENING BILATERAL MAMMOGRAM WITH TOMOSYNTHESIS AND CAD
TECHNIQUE: Bilateral screening digital craniocaudal and mediolateral oblique
mammograms were obtained. Bilateral screening digital breast
tomosynthesis was performed. The images were evaluated with
computer-aided detection.

[L CC synth-2D]
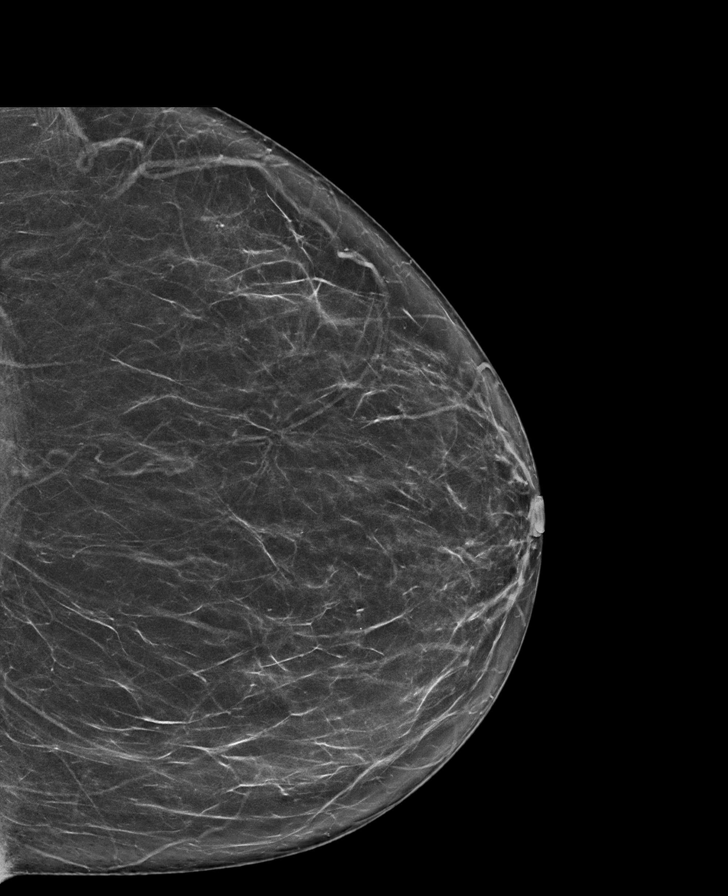

[R MLO synth-2D (1 of 2)]
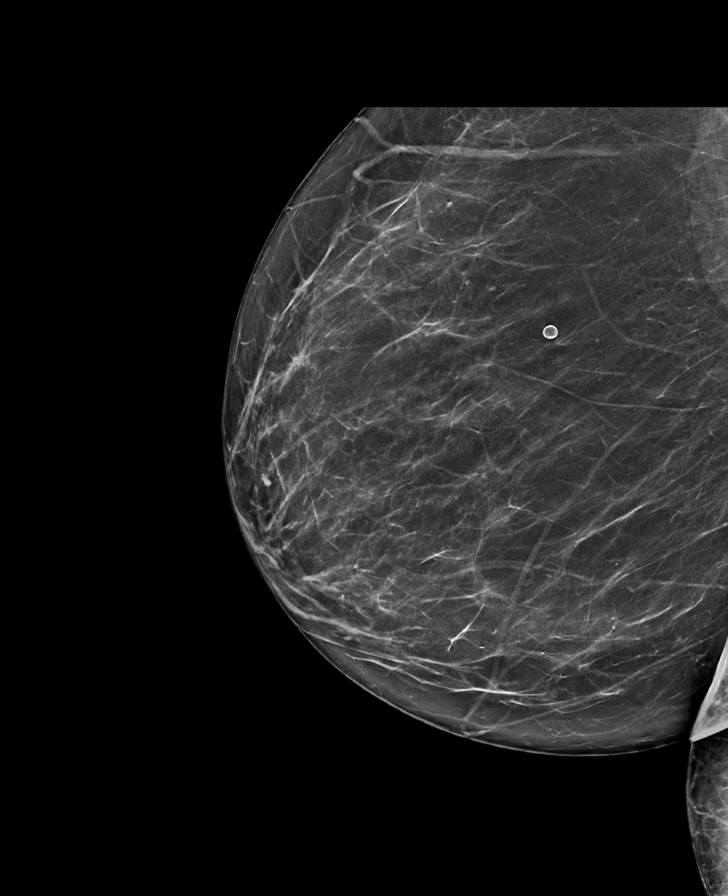

[R MLO synth-2D (2 of 2)]
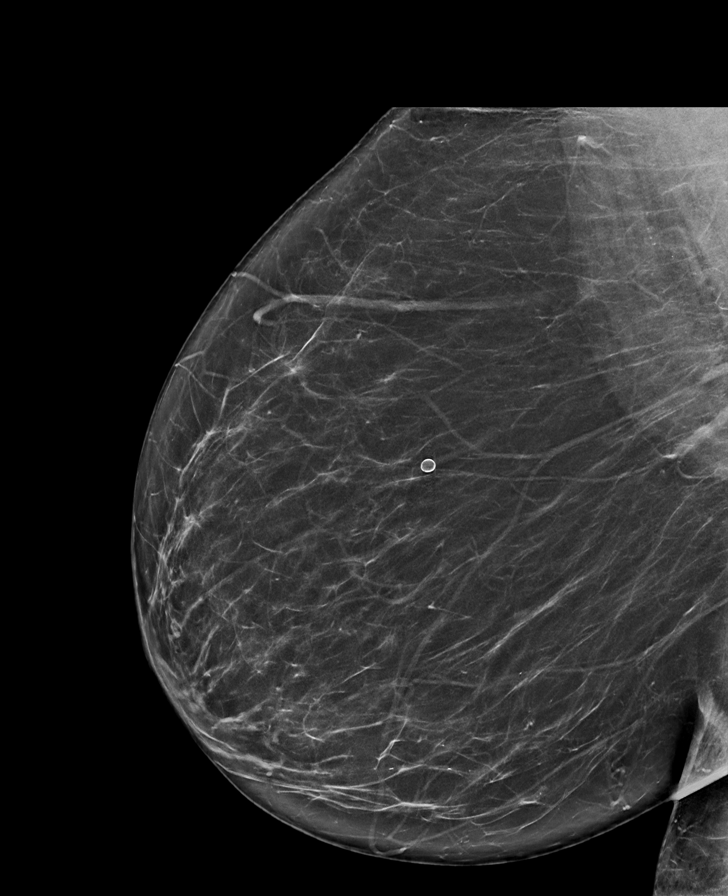

[L MLO synth-2D (1 of 2)]
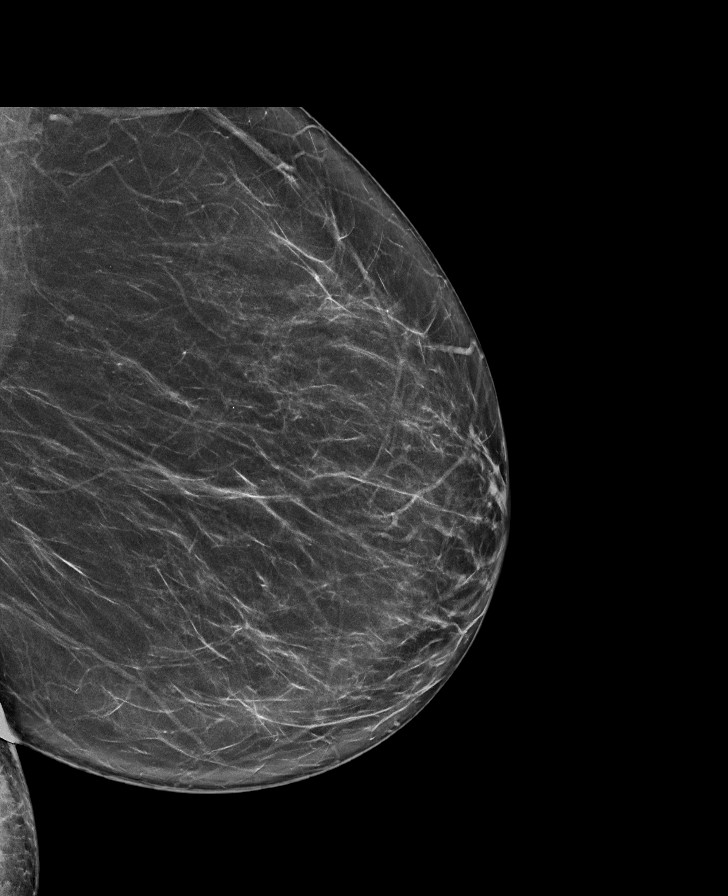

[L MLO synth-2D (2 of 2)]
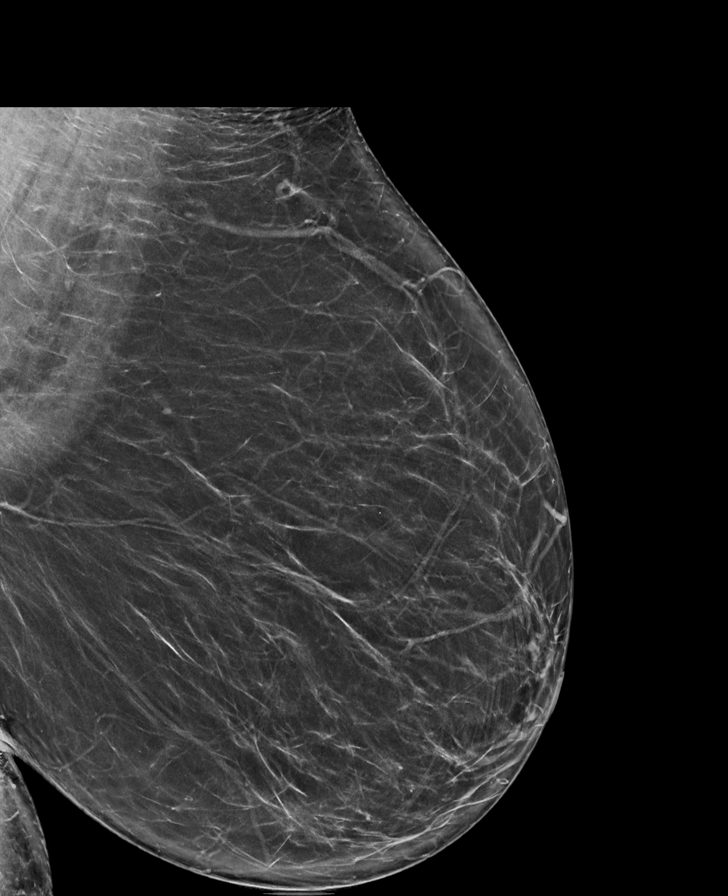

[R CC synth-2D]
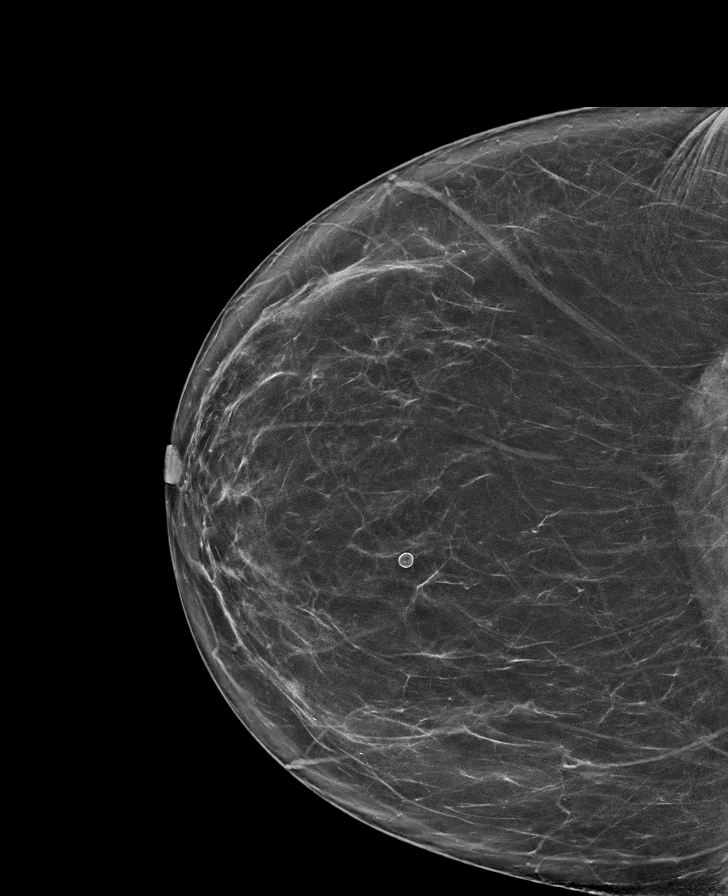

[R CV synth-2D]
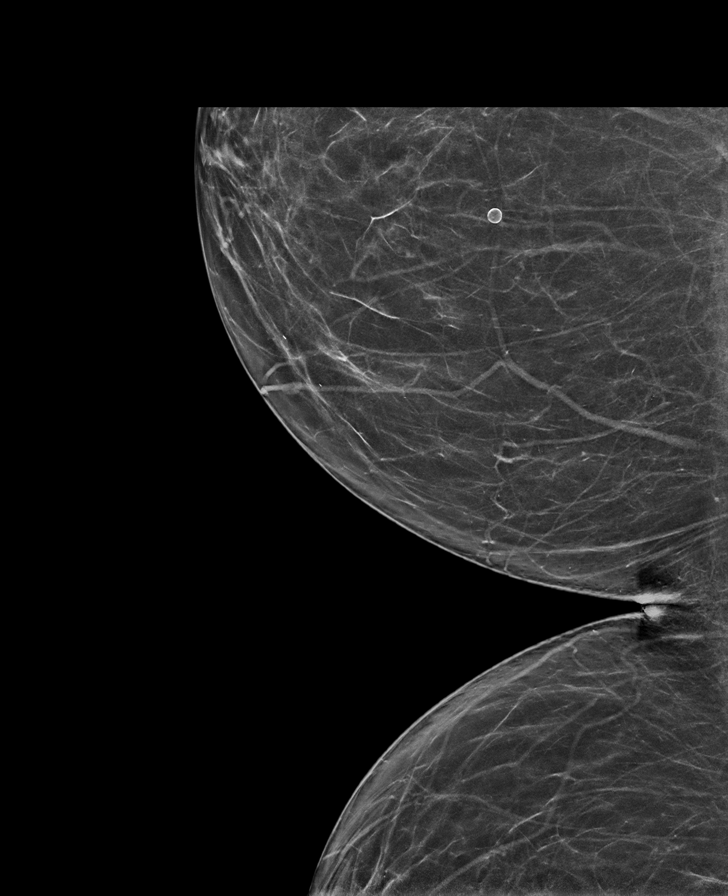

[R MLO tomo · tomo slice 39/77.0]
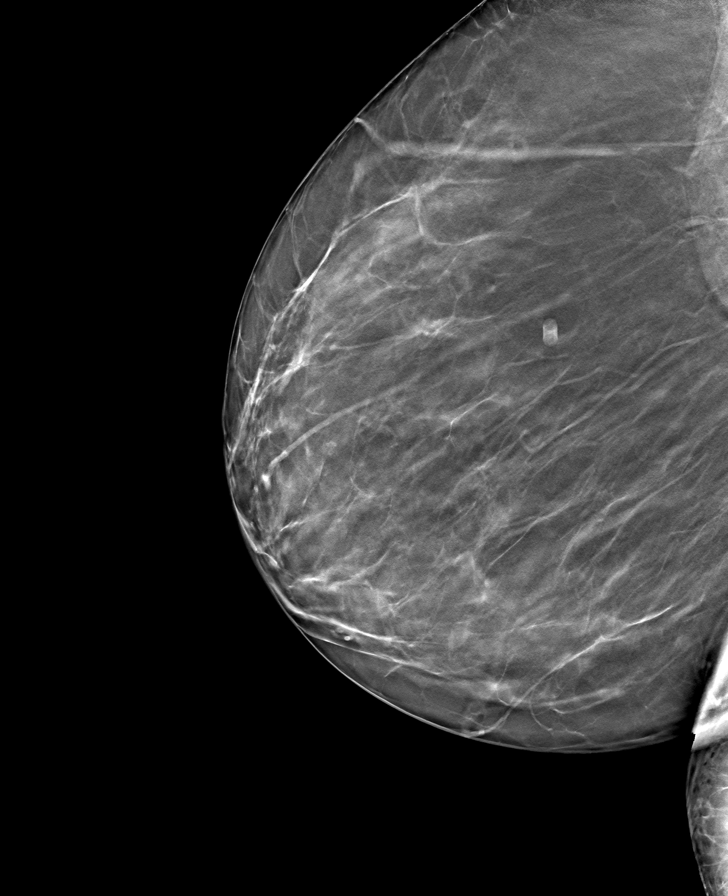

[8 of 40 positions shown; findings below may reference images not displayed]

ACR Breast Density Category b: There are scattered areas of
fibroglandular density.
FINDINGS: There are no findings suspicious for malignancy. The images were
evaluated with computer-aided detection.
IMPRESSION: No mammographic evidence of malignancy. A result letter of this
screening mammogram will be mailed directly to the patient.

RECOMMENDATION:
Screening mammogram in one year. (Code:[id number]-[id number])

BI-RADS CATEGORY  1: Negative.

## 2022-01-06 ENCOUNTER — Ambulatory Visit: Payer: 59 | Attending: Family Medicine | Admitting: Pharmacist

## 2022-01-06 ENCOUNTER — Encounter: Payer: Self-pay | Admitting: Pharmacist

## 2022-01-06 VITALS — BP 121/72

## 2022-01-06 DIAGNOSIS — I10 Essential (primary) hypertension: Secondary | ICD-10-CM | POA: Diagnosis not present

## 2022-01-06 NOTE — Progress Notes (Signed)
? ?S:    ? ?No chief complaint on file. ? ? ?Emily Dickson is a 66 y.o. female who presents for hypertension evaluation, education, and management. PMH is significant for HTN, T2DM, HLD, obesity. Patient was referred and last seen by Primary Care Provider, Dr. Margarita Rana, on 09/04/2022. Pt was contacted by CPP Catie Jodi Mourning in April of this year d/t failing our True [place] HTN control metric. Education was provided over the phone and pt was encouraged to take her BP medications before seeing me today. ? ?Today, patient arrives in good spirits and presents without assistance. Denies dizziness, headache, blurred vision, swelling.  ? ?Patient reports hypertension is longstanding. ? ?Family/Social history:  ?Fhx: no pertinent positives  ?Tobacco: never smoker  ?Alcohol: denies use  ? ?Medication adherence reported. Patient has taken BP medications today. She reveals to me that in the past, she had not taken her medications on the day of her clinic appointments. She did this for a while thinking that "fasting" for blood work meant to abstain from taking medications as well. She now knows she can take her medications in the morning before coming to the clinic and she has done so today. ? ?Current antihypertensives include: lisinopril-HCTZ 40-25 mg daily ? ?Reported home BP readings:  ?- Takes at her job  ?- Gives reading from this morning:  ?- 137/92 mmHg ? ?Patient reported dietary habits:  ?- Compliant with salt restriction  ?- Denies excessive intake of caffeine  ? ?Patient-reported exercise habits:  ?- Treadmill at gym: 1 day/wk for an hour at a time  ? ?O:  ?Vitals:  ? 01/06/22 1023  ?BP: 121/72  ? ? ?Last 3 Office BP readings: ?BP Readings from Last 3 Encounters:  ?01/06/22 121/72  ?12/16/21 134/84  ?09/04/21 (!) 176/103  ? ? ?BMET ?   ?Component Value Date/Time  ? NA 142 08/05/2021 1658  ? K 4.1 08/05/2021 1658  ? CL 101 08/05/2021 1658  ? CO2 26 08/05/2021 1658  ? GLUCOSE 93 08/05/2021 1658  ? GLUCOSE 102 (H)  09/21/2016 1100  ? BUN 14 08/05/2021 1658  ? CREATININE 0.67 08/05/2021 1658  ? CREATININE 0.54 09/21/2016 1100  ? CALCIUM 9.9 08/05/2021 1658  ? GFRNONAA 94 04/11/2020 0850  ? GFRNONAA >89 09/21/2016 1100  ? GFRAA 108 04/11/2020 0850  ? GFRAA >89 09/21/2016 1100  ? ? ?Renal function: ?CrCl cannot be calculated (Patient's most recent lab result is older than the maximum 21 days allowed.). ? ?Clinical ASCVD: No  ?The 10-year ASCVD risk score (Arnett DK, et al., 2019) is: 14.5% ?  Values used to calculate the score: ?    Age: 28 years ?    Sex: Female ?    Is Non-Hispanic African American: Yes ?    Diabetic: Yes ?    Tobacco smoker: No ?    Systolic Blood Pressure: 440 mmHg ?    Is BP treated: Yes ?    HDL Cholesterol: 63 mg/dL ?    Total Cholesterol: 172 mg/dL ? ?A/P: ?Hypertension longstanding currently at goal on current medications. BP goal < 130/80 mmHg. Medication adherence appears appropriate. Encouraged compliance and for her to continue taking medications on the days of her office visits before coming to clinic.   ?-Continued current regimen. Appreciate input from [place], PharmD, BCACP. ?-Counseled on lifestyle modifications for blood pressure control including reduced dietary sodium, increased exercise, adequate sleep. ?-Encouraged patient to check BP at home and bring log of readings to next visit. Counseled  on proper use of home BP cuff.  ? ?Results reviewed and written information provided. Patient verbalized understanding of treatment plan. Total time in face-to-face counseling 30 minutes.  ? ?F/u clinic visit in 1 month for recheck. ? ?Benard Halsted, PharmD, BCACP, CPP ?Clinical Pharmacist ?[place] ?[phone] ? ? ?

## 2022-01-08 ENCOUNTER — Other Ambulatory Visit: Payer: Self-pay | Admitting: Family Medicine

## 2022-01-08 DIAGNOSIS — R600 Localized edema: Secondary | ICD-10-CM

## 2022-01-15 ENCOUNTER — Other Ambulatory Visit: Payer: Self-pay | Admitting: Family Medicine

## 2022-01-15 DIAGNOSIS — I10 Essential (primary) hypertension: Secondary | ICD-10-CM

## 2022-02-05 ENCOUNTER — Other Ambulatory Visit: Payer: Self-pay | Admitting: Family Medicine

## 2022-02-05 DIAGNOSIS — E7800 Pure hypercholesterolemia, unspecified: Secondary | ICD-10-CM

## 2022-02-06 ENCOUNTER — Ambulatory Visit: Payer: 59 | Attending: Family Medicine | Admitting: Pharmacist

## 2022-02-06 VITALS — BP 94/61

## 2022-02-06 DIAGNOSIS — I10 Essential (primary) hypertension: Secondary | ICD-10-CM | POA: Diagnosis not present

## 2022-02-06 NOTE — Progress Notes (Signed)
S:     No chief complaint on file.   Emily Dickson is a 66 y.o. female who presents for hypertension evaluation, education, and management. PMH is significant for HTN, T2DM, HLD, obesity. Patient was referred and last seen by Primary Care Provider, Dr. Margarita Rana, on 09/04/2022. I saw her on 01/06/2022 and her BP was good.  Today, patient arrives in poor spirits and presents without assistance. Denies dizziness, headache, blurred vision, swelling. She endorses anxiety d/t to the passing of two of her family members. She is attending a funeral today and tomorrow. She notes poor PO intake over the last several weeks d/t this.   Patient reports hypertension is longstanding.  Family/Social history:  Fhx: no pertinent positives  Tobacco: never smoker  Alcohol: denies use   Medication adherence reported. Patient has taken BP medications today.   Current antihypertensives include: lisinopril-HCTZ 40-25 mg daily  Reported home BP readings:  - Takes at her job  - Gives readings as follows:  -Has pictures of her BP machine from home with her today.  SBPs: 120s  DBPs: 80s "[place] pressures at home" - gives readings 100s-110s/70s  Patient reported dietary habits:  - Compliant with salt restriction  - Denies excessive intake of caffeine  -Endorses poor PO intake d/t recent deaths in the family. Has a funeral today and tomorrow.   Patient-reported exercise habits:  - Treadmill at gym: 1 day/week for an hour at a time  - Instructions given for a target of 150s mins/week  O:  Vitals:   02/06/22 0935  BP: 94/61   Last 3 Office BP readings: BP Readings from Last 3 Encounters:  02/06/22 94/61  01/06/22 121/72  12/16/21 134/84   BMET    Component Value Date/Time   NA 142 08/05/2021 1658   K 4.1 08/05/2021 1658   CL 101 08/05/2021 1658   CO2 26 08/05/2021 1658   GLUCOSE 93 08/05/2021 1658   GLUCOSE 102 (H) 09/21/2016 1100   BUN 14 08/05/2021 1658   CREATININE 0.67 08/05/2021 1658    CREATININE 0.54 09/21/2016 1100   CALCIUM 9.9 08/05/2021 1658   GFRNONAA 94 04/11/2020 0850   GFRNONAA >89 09/21/2016 1100   GFRAA 108 04/11/2020 0850   GFRAA >89 09/21/2016 1100    Renal function: CrCl cannot be calculated (Patient's most recent lab result is older than the maximum 21 days allowed.).  Clinical ASCVD: No  The 10-year ASCVD risk score (Arnett DK, et al., 2019) is: 7.9%   Values used to calculate the score:     Age: 21 years     Sex: Female     Is Non-Hispanic African American: Yes     Diabetic: Yes     Tobacco smoker: No     Systolic Blood Pressure: 94 mmHg     Is BP treated: Yes     HDL Cholesterol: 63 mg/dL     Total Cholesterol: 172 mg/dL  A/P: Hypertension longstanding currently borderline hypotensive on current medications. BP goal < 130/80 mmHg. Medication adherence appears appropriate. She is asymptomatic today. Will get labs as her intake has been poor. May need to decrease medication dose.  -Continued current regimen for now. -Counseled on lifestyle modifications for blood pressure control including reduced dietary sodium, increased exercise, adequate sleep. -Counseled regarding symptoms of hypotension, orthostasis - advised to try to increase PO intake. Advised to call us with continued hypotensive readings as she may need to decrease dose of Zestoretic. -Encouraged patient to check BP at  home and bring log of readings to next visit. Counseled on proper use of home BP cuff.   Results reviewed and written information provided. Patient verbalized understanding of treatment plan. Total time in face-to-face counseling 30 minutes.   F/u clinic visit in July with PCP.  Benard Halsted, PharmD, [facility] [place], [place] [phone]

## 2022-02-07 LAB — CMP14+EGFR
ALT: 26 IU/L (ref 0–32)
AST: 27 IU/L (ref 0–40)
Albumin/Globulin Ratio: 1.4 (ref 1.2–2.2)
Albumin: 4.1 g/dL (ref 3.8–4.8)
Alkaline Phosphatase: 66 IU/L (ref 44–121)
BUN/Creatinine Ratio: 19 (ref 12–28)
BUN: 15 mg/dL (ref 8–27)
Bilirubin Total: 0.5 mg/dL (ref 0.0–1.2)
CO2: 27 mmol/L (ref 20–29)
Calcium: 9.7 mg/dL (ref 8.7–10.3)
Chloride: 91 mmol/L — ABNORMAL LOW (ref 96–106)
Creatinine, Ser: 0.81 mg/dL (ref 0.57–1.00)
Globulin, Total: 2.9 g/dL (ref 1.5–4.5)
Glucose: 114 mg/dL — ABNORMAL HIGH (ref 70–99)
Potassium: 3.9 mmol/L (ref 3.5–5.2)
Sodium: 133 mmol/L — ABNORMAL LOW (ref 134–144)
Total Protein: 7 g/dL (ref 6.0–8.5)
eGFR: 81 mL/min/{1.73_m2} (ref >59)

## 2022-02-10 ENCOUNTER — Telehealth: Payer: Self-pay | Admitting: Family Medicine

## 2022-02-10 MED ORDER — SEMAGLUTIDE(0.25 OR 0.5MG/DOS) 2 MG/3ML ~~LOC~~ SOPN: 0.5000 mg | PEN_INJECTOR | SUBCUTANEOUS | 1 refills | Status: DC

## 2022-02-10 NOTE — Telephone Encounter (Signed)
Rx for the 66m volume pen sent.

## 2022-02-10 NOTE — Telephone Encounter (Signed)
[facility] calling to let the dr know they changed the pack size of the  Semaglutide,0.25 or 0.'5MG'$ /DOS, (OZEMPIC, 0.25 OR 0.5 MG/DOSE,) 2 MG/1.5ML SOPN  To '2MG'$  /3ML Please call back for approval. Pt is out of medication and would like if you can approve please  [employer]  [place], [place]

## 2022-02-12 ENCOUNTER — Other Ambulatory Visit: Payer: Self-pay | Admitting: Family Medicine

## 2022-02-12 ENCOUNTER — Telehealth: Payer: Self-pay | Admitting: Family Medicine

## 2022-02-12 DIAGNOSIS — Z1231 Encounter for screening mammogram for malignant neoplasm of breast: Secondary | ICD-10-CM

## 2022-02-12 NOTE — Telephone Encounter (Signed)
Order will be faxed to [facility].

## 2022-02-12 NOTE — Telephone Encounter (Signed)
Copied from [place] [id number]. Topic: Referral - Status >> Feb 12, 2022  2:00 PM Ja-Kwan M wrote: Reason for CRM: Pt stated she reached out to schedule appt for mammogram and she was told a referral was needed. Pt requests that referral be sent to [facility] ph# [phone]

## 2022-02-13 ENCOUNTER — Other Ambulatory Visit: Payer: Self-pay | Admitting: Family Medicine

## 2022-02-13 DIAGNOSIS — I10 Essential (primary) hypertension: Secondary | ICD-10-CM

## 2022-03-06 ENCOUNTER — Other Ambulatory Visit: Payer: Self-pay | Admitting: Family Medicine

## 2022-03-06 DIAGNOSIS — I10 Essential (primary) hypertension: Secondary | ICD-10-CM

## 2022-03-09 ENCOUNTER — Other Ambulatory Visit: Payer: Self-pay | Admitting: Family Medicine

## 2022-03-09 DIAGNOSIS — R600 Localized edema: Secondary | ICD-10-CM

## 2022-03-09 MED ORDER — LISINOPRIL-HYDROCHLOROTHIAZIDE 20-12.5 MG PO TABS: 2.0000 | ORAL_TABLET | Freq: Every day | ORAL | 0 refills | Status: DC

## 2022-03-09 MED ORDER — FUROSEMIDE 20 MG PO TABS: 20.0000 mg | ORAL_TABLET | Freq: Every day | ORAL | 1 refills | Status: DC | PRN

## 2022-04-01 ENCOUNTER — Telehealth: Payer: Self-pay

## 2022-04-01 ENCOUNTER — Other Ambulatory Visit: Payer: Self-pay

## 2022-04-01 ENCOUNTER — Ambulatory Visit: Payer: 59 | Attending: Family Medicine | Admitting: Family Medicine

## 2022-04-01 ENCOUNTER — Encounter: Payer: Self-pay | Admitting: Family Medicine

## 2022-04-01 VITALS — BP 146/86 | HR 75 | Temp 97.5°F | Ht 66.0 in | Wt 257.6 lb

## 2022-04-01 DIAGNOSIS — I152 Hypertension secondary to endocrine disorders: Secondary | ICD-10-CM

## 2022-04-01 DIAGNOSIS — E1159 Type 2 diabetes mellitus with other circulatory complications: Secondary | ICD-10-CM | POA: Diagnosis not present

## 2022-04-01 DIAGNOSIS — E119 Type 2 diabetes mellitus without complications: Secondary | ICD-10-CM | POA: Diagnosis not present

## 2022-04-01 DIAGNOSIS — E1142 Type 2 diabetes mellitus with diabetic polyneuropathy: Secondary | ICD-10-CM | POA: Diagnosis not present

## 2022-04-01 DIAGNOSIS — E1169 Type 2 diabetes mellitus with other specified complication: Secondary | ICD-10-CM

## 2022-04-01 DIAGNOSIS — E785 Hyperlipidemia, unspecified: Secondary | ICD-10-CM | POA: Diagnosis not present

## 2022-04-01 LAB — POCT GLYCOSYLATED HEMOGLOBIN (HGB A1C): HbA1c, POC (controlled diabetic range): 6.1 % (ref 0.0–7.0)

## 2022-04-01 LAB — GLUCOSE, POCT (MANUAL RESULT ENTRY): POC Glucose: 96 mg/dl (ref 70–99)

## 2022-04-01 MED ORDER — SEMAGLUTIDE (1 MG/DOSE) 4 MG/3ML ~~LOC~~ SOPN: 1.0000 mg | PEN_INJECTOR | SUBCUTANEOUS | 6 refills | Status: DC

## 2022-04-01 MED ORDER — TRULICITY 1.5 MG/0.5ML ~~LOC~~ SOAJ: 1.5000 mg | SUBCUTANEOUS | 3 refills | Status: DC

## 2022-04-01 MED ORDER — LISINOPRIL-HYDROCHLOROTHIAZIDE 20-12.5 MG PO TABS: 2.0000 | ORAL_TABLET | Freq: Every day | ORAL | 1 refills | Status: DC

## 2022-04-01 MED ORDER — ATORVASTATIN CALCIUM 40 MG PO TABS: ORAL_TABLET | ORAL | 1 refills | Status: DC

## 2022-04-01 NOTE — Progress Notes (Signed)
Subjective:  Patient ID: Emily Dickson, female    DOB: 08/12/1956  Age: 66 y.o. MRN: [id number]  CC: Diabetes   HPI Emily Dickson is a 66 y.o. year old female with a history of hypertension, hyperlipidemia, chronic rhinitis, right knee osteoarthritis, type 2 DM (A1c 6.1).  Interval History: She has lost 9 pounds since her last visit and is doing well on Ozempic and metformin.  Denies presence of hypoglycemia.  She has no visual concerns but does have slight tingling in her left foot once in a while.  Her blood pressure is elevated and she attributes this to being stressed by lots of family deaths in her home country and family has been asking her for a lot of money.  At her last visit with clinical pharmacist blood pressure was on the low side at 94/61.  Endorses adherence with her statin.  Right knee osteoarthritis is stable with no recent flares.   Past Medical History:  Diagnosis Date   Allergy    seasonal   Arthritis    Back pain    Blood transfusion without reported diagnosis    with child birth   Diabetes mellitus without complication ([place])    High cholesterol    Hypertension     No past surgical history on file.  Family History  Problem Relation Age of Onset   Colon cancer Neg Hx    Colon polyps Neg Hx    Esophageal cancer Neg Hx    Stomach cancer Neg Hx    Rectal cancer Neg Hx     Social History   Socioeconomic History   Marital status: Single    Spouse name: Not on file   Number of children: Not on file   Years of education: Not on file   Highest education level: Not on file  Occupational History   Not on file  Tobacco Use   Smoking status: Never   Smokeless tobacco: Never  Vaping Use   Vaping Use: Never used  Substance and Sexual Activity   Alcohol use: No   Drug use: No   Sexual activity: Yes    Partners: Male    Birth control/protection: None  Other Topics Concern   Not on file  Social History Narrative   Not on file   Social  Determinants of Health   Financial Resource Strain: Not on file  Food Insecurity: Not on file  Transportation Needs: Not on file  Physical Activity: Not on file  Stress: Not on file  Social Connections: Not on file    Allergies  Allergen Reactions   Penicillins Rash    Has patient had a PCN reaction causing immediate rash, facial/tongue/throat swelling, SOB or lightheadedness with hypotension: No Has patient had a PCN reaction causing severe rash involving mucus membranes or skin necrosis: No Has patient had a PCN reaction that required hospitalization No Has patient had a PCN reaction occurring within the last 10 years: No If all of the above answers are "NO", then may proceed with Cephalosporin use.    Outpatient Medications Prior to Visit  Medication Sig Dispense Refill   aspirin EC 81 MG tablet Take 1 tablet (81 mg total) by mouth daily. 90 tablet 1   Blood Glucose Monitoring Suppl (ONETOUCH VERIO REFLECT) w/Device KIT 1 kit by Does not apply route 3 (three) times daily. Use as instructed to check blood sugar three times daily. E11.9 1 kit 0   diclofenac (VOLTAREN) 75 MG EC tablet TAKE 1  TABLET BY MOUTH TWICE DAILY. 180 tablet 1   fluticasone (FLONASE) 50 MCG/ACT nasal spray Place 2 sprays into both nostrils daily. 16 g 6   furosemide (LASIX) 20 MG tablet Take 1 tablet (20 mg total) by mouth daily as needed. 30 tablet 1   Garlic 0131 MG CAPS Take by mouth.     glucose blood (ONETOUCH VERIO) test strip Use as instructed to check blood sugar three times daily. E11.9 100 each 12   GNP LORATADINE 10 MG tablet TAKE 1 TABLET (10 MG TOTAL) BY MOUTH DAILY. 30 tablet 5   Multiple Vitamin (MULTIVITAMIN WITH MINERALS) TABS tablet Take 1 tablet by mouth daily.     nitroGLYCERIN (NITROSTAT) 0.4 MG SL tablet PLACE 1 TABLET (0.4 MG TOTAL) UNDER THE TONGUE EVERY 5 (FIVE) MINUTES AS NEEDED FOR CHEST PAIN. 25 tablet 1   OneTouch Delica Lancets 43O MISC Use as instructed to check blood sugar three  times daily. E11.9 100 each 11   potassium chloride (KLOR-CON) 10 MEQ tablet Take 1 tablet (10 mEq total) by mouth daily. 90 tablet 1   atorvastatin (LIPITOR) 40 MG tablet TAKE 1 TABLET BY MOUTH ONCE DAILY AT  6PM 90 tablet 0   lisinopril-hydrochlorothiazide (ZESTORETIC) 20-12.5 MG tablet Take 2 tablets by mouth daily. 180 tablet 0   metFORMIN (GLUCOPHAGE) 500 MG tablet Take 2 tablets (1,000 mg total) by mouth 2 (two) times daily with a meal. 360 tablet 1   Semaglutide,0.25 or 0.5MG/DOS, 2 MG/3ML SOPN Inject 0.5 mg into the skin once a week. 3 mL 1   Apple Cider Vinegar 500 MG TABS Take 500 mg by mouth one time only at 6 PM. (Patient not taking: Reported on 04/01/2022)     Facility-Administered Medications Prior to Visit  Medication Dose Route Frequency Provider Last Rate Last Admin   0.9 %  sodium chloride infusion  500 mL Intravenous Continuous Nandigam, Kavitha V, MD         ROS Review of Systems  Constitutional:  Negative for activity change, appetite change and fatigue.  HENT:  Negative for congestion, sinus pressure and sore throat.   Eyes:  Negative for visual disturbance.  Respiratory:  Negative for cough, chest tightness, shortness of breath and wheezing.   Cardiovascular:  Negative for chest pain and palpitations.  Gastrointestinal:  Negative for abdominal distention, abdominal pain and constipation.  Endocrine: Negative for polydipsia.  Genitourinary:  Negative for dysuria and frequency.  Musculoskeletal:  Negative for arthralgias and back pain.  Skin:  Negative for rash.  Neurological:  Negative for tremors, light-headedness and numbness.  Hematological:  Does not bruise/bleed easily.  Psychiatric/Behavioral:  Negative for agitation and behavioral problems.     Objective:  BP (!) 146/86   Pulse 75   Temp (!) 97.5 F (36.4 C) (Oral)   Ht _0  (1.676 m)   Wt 257 lb 9.6 oz (116.8 kg)   SpO2 97%   BMI 41.58 kg/m      04/01/2022    8:45 AM 02/06/2022    9:35 AM  01/06/2022   10:23 AM  BP/Weight  Systolic BP 887 94 579  Diastolic BP 86 61 72  Wt. (Lbs) 257.6    BMI 41.58 kg/m2        Physical Exam Constitutional:      Appearance: She is well-developed.  Cardiovascular:     Rate and Rhythm: Normal rate.     Heart sounds: Normal heart sounds. No murmur heard. Pulmonary:     Effort:  Pulmonary effort is normal.     Breath sounds: Normal breath sounds. No wheezing or rales.  Chest:     Chest wall: No tenderness.  Abdominal:     General: Bowel sounds are normal. There is no distension.     Palpations: Abdomen is soft. There is no mass.     Tenderness: There is no abdominal tenderness.  Musculoskeletal:        General: Normal range of motion.     Right lower leg: No edema.     Left lower leg: No edema.  Neurological:     Mental Status: She is alert and oriented to person, place, and time.  Psychiatric:        Mood and Affect: Mood normal.        Latest Ref Rng & Units 02/06/2022    9:38 AM 08/05/2021    4:58 PM 12/11/2020   10:17 AM  CMP  Glucose 70 - 99 mg/dL 114  93  97   BUN 8 - 27 mg/dL _0 Creatinine 0.57 - 1.00 mg/dL 0.81  0.67  0.60   Sodium 134 - 144 mmol/L 133  142  141   Potassium 3.5 - 5.2 mmol/L 3.9  4.1  4.0   Chloride 96 - 106 mmol/L 91  101  98   CO2 20 - 29 mmol/L _1 Calcium 8.7 - 10.3 mg/dL 9.7  9.9  9.8   Total Protein 6.0 - 8.5 g/dL 7.0  7.5  7.2   Total Bilirubin 0.0 - 1.2 mg/dL 0.5  0.4  0.3   Alkaline Phos 44 - 121 IU/L 66  72  75   AST 0 - 40 IU/L _2 ALT 0 - 32 IU/L _3 Lipid Panel     Component Value Date/Time   CHOL 172 12/11/2020 1017   TRIG 56 12/11/2020 1017   HDL 63 12/11/2020 1017   CHOLHDL 2.7 12/11/2020 1017   CHOLHDL 2.6 03/11/2016 1043   VLDL 13 03/11/2016 1043   LDLCALC 98 12/11/2020 1017    CBC    Component Value Date/Time   WBC 4.2 05/20/2016 1525   RBC 4.42 05/20/2016 1525   HGB 12.2 05/20/2016 1525   HCT 38.0 05/20/2016 1525   PLT 181  05/20/2016 1525   MCV 86.0 05/20/2016 1525   MCH 27.6 05/20/2016 1525   MCHC 32.1 05/20/2016 1525   RDW 18.3 (H) 05/20/2016 1525   LYMPHSABS 1.5 06/18/2015 1815   MONOABS 0.8 06/18/2015 1815   EOSABS 0.1 06/18/2015 1815   BASOSABS 0.0 06/18/2015 1815    Lab Results  Component Value Date   HGBA1C 6.1 04/01/2022    Assessment & Plan:  1. Type 2 diabetes mellitus without complication, without long-term current use of insulin (HCC) Controlled with A1c of 6.1 Discontinued metformin and increase Ozempic dose from 0.5 mg to 1 mg for additional weight loss benefit After Ozempic prescription was sent to pharmacy, I was notified that her insurance would require that she fail Trulicity first by this time patient had left the clinic I will have pharmacy substitute with Trulicity and inform the patient accordingly Counseled on Diabetic diet, my plate method, 510 minutes of moderate intensity exercise/week Blood sugar logs with fasting goals of 80-120 mg/dl, random of less than 180 and in the event of sugars less than 60 mg/dl or greater than 400  mg/dl encouraged to notify the clinic. Advised on the need for annual eye exams, annual foot exams, Pneumonia vaccine. - POCT glucose (manual entry) - POCT glycosylated hemoglobin (Hb A1C) - Ambulatory referral to Ophthalmology - LP+Non-HDL Cholesterol - Basic Metabolic Panel  2. Hyperlipidemia associated with type 2 diabetes mellitus (HCC) Controlled Low-cholesterol diet - atorvastatin (LIPITOR) 40 MG tablet; TAKE 1 TABLET BY MOUTH ONCE DAILY AT  6PM  Dispense: 90 tablet; Refill: 1  3. Hypertension associated with diabetes ([place]) Slightly above goal We will make no regimen changes today as at her last visit blood pressure was on the soft side Elevation could be due to stress Advised on self-care Counseled on blood pressure goal of less than 130/80, low-sodium, DASH diet, medication compliance, 150 minutes of moderate intensity exercise per  week. Discussed medication compliance, adverse effects. - lisinopril-hydrochlorothiazide (ZESTORETIC) 20-12.5 MG tablet; Take 2 tablets by mouth daily.  Dispense: 180 tablet; Refill: 1  4. Diabetic polyneuropathy associated with type 2 diabetes mellitus ([place]) Episodes are intermittent and she does not want to commence medication at this time We will notify me if symptoms persist    Meds ordered this encounter  Medications   Semaglutide, 1 MG/DOSE, 4 MG/3ML SOPN    Sig: Inject 1 mg as directed once a week.    Dispense:  3 mL    Refill:  6    Dose increase, discontinue metformin   atorvastatin (LIPITOR) 40 MG tablet    Sig: TAKE 1 TABLET BY MOUTH ONCE DAILY AT  6PM    Dispense:  90 tablet    Refill:  1   lisinopril-hydrochlorothiazide (ZESTORETIC) 20-12.5 MG tablet    Sig: Take 2 tablets by mouth daily.    Dispense:  180 tablet    Refill:  1    Follow-up: Return in about 6 months (around 10/02/2022) for Chronic medical conditions.       Charlott Rakes, MD, FAAFP. [facility] [place], [place]   04/01/2022, 11:07 AM

## 2022-04-01 NOTE — Progress Notes (Signed)
Medication refills

## 2022-04-01 NOTE — Patient Instructions (Signed)

## 2022-04-01 NOTE — Telephone Encounter (Signed)
Ozempic is non-formulary on patient's new ins.  She will have to have tried and failed the preferred alternantives.  Preferred=Trulicity and Victoza.  If appropriate, please change to a preferred alternative.

## 2022-04-02 ENCOUNTER — Other Ambulatory Visit: Payer: Self-pay

## 2022-04-02 LAB — LP+NON-HDL CHOLESTEROL
Cholesterol, Total: 152 mg/dL (ref 100–199)
HDL: 60 mg/dL (ref >39)
LDL Chol Calc (NIH): 76 mg/dL (ref 0–99)
Total Non-HDL-Chol (LDL+VLDL): 92 mg/dL (ref 0–129)
Triglycerides: 84 mg/dL (ref 0–149)
VLDL Cholesterol Cal: 16 mg/dL (ref 5–40)

## 2022-04-02 LAB — BASIC METABOLIC PANEL
BUN/Creatinine Ratio: 17 (ref 12–28)
BUN: 12 mg/dL (ref 8–27)
CO2: 28 mmol/L (ref 20–29)
Calcium: 9.9 mg/dL (ref 8.7–10.3)
Chloride: 98 mmol/L (ref 96–106)
Creatinine, Ser: 0.69 mg/dL (ref 0.57–1.00)
Glucose: 100 mg/dL — ABNORMAL HIGH (ref 70–99)
Potassium: 4.1 mmol/L (ref 3.5–5.2)
Sodium: 142 mmol/L (ref 134–144)
eGFR: 97 mL/min/{1.73_m2} (ref >59)

## 2022-04-03 ENCOUNTER — Other Ambulatory Visit: Payer: Self-pay

## 2022-04-03 ENCOUNTER — Telehealth: Payer: Self-pay

## 2022-04-03 NOTE — Telephone Encounter (Signed)
Copied from [place] [phone]. Topic: General - Call Back - No Documentation >> Apr 03, 2022  9:51 AM Sabas Sous wrote: Reason for CRM: Pt called requesting to speak to [place] regarding her ozempic, she has questions  Best contact: [phone]   Called patient and informed her that medication was changed due to insurance. Pt states that she will try the medication and if she has any problem she will call the office.

## 2022-04-08 ENCOUNTER — Other Ambulatory Visit: Payer: Self-pay | Admitting: Family Medicine

## 2022-04-08 DIAGNOSIS — E119 Type 2 diabetes mellitus without complications: Secondary | ICD-10-CM

## 2022-04-13 ENCOUNTER — Ambulatory Visit
Admission: RE | Admit: 2022-04-13 | Discharge: 2022-04-13 | Disposition: A | Discharge status: Home / Self Care | Payer: 59 | Source: Ambulatory Visit | Attending: Family Medicine | Admitting: Family Medicine

## 2022-04-13 DIAGNOSIS — Z1231 Encounter for screening mammogram for malignant neoplasm of breast: Secondary | ICD-10-CM

## 2022-04-15 ENCOUNTER — Other Ambulatory Visit: Payer: Self-pay | Admitting: Family Medicine

## 2022-04-15 DIAGNOSIS — E876 Hypokalemia: Secondary | ICD-10-CM

## 2022-04-15 NOTE — Telephone Encounter (Signed)
Requested Prescriptions  Pending Prescriptions Disp Refills  . potassium chloride (KLOR-CON) 10 MEQ tablet [Pharmacy Med Name: Potassium Chloride ER 10 MEQ Oral Tablet Extended Release] 90 tablet 3    Sig: Take 1 tablet by mouth once daily     Endocrinology:  Minerals - Potassium Supplementation Passed - 04/15/2022  5:30 AM      Passed - K in normal range and within 360 days    Potassium  Date Value Ref Range Status  04/01/2022 4.1 3.5 - 5.2 mmol/L Final         Passed - Cr in normal range and within 360 days    Creat  Date Value Ref Range Status  09/21/2016 0.54 0.50 - 1.05 mg/dL Final    Comment:      For patients > or = 64 years of age: The upper reference limit for Creatinine is approximately 13% higher for people identified as African-American.      Creatinine, Ser  Date Value Ref Range Status  04/01/2022 0.69 0.57 - 1.00 mg/dL Final         Passed - Valid encounter within last 12 months    Recent Outpatient Visits          2 weeks ago Type 2 diabetes mellitus without complication, without long-term current use of insulin ([place])   [place], Enobong, MD   2 months ago Essential hypertension   [place], Stephen L, RPH-CPP   3 months ago Essential hypertension   [place], Jarome Matin, RPH-CPP   7 months ago Annual physical exam   [place], Charlane Ferretti, MD   8 months ago Type 2 diabetes mellitus without complication, without long-term current use of insulin ([place])   [place], MD      Future Appointments            In 5 months Charlott Rakes, MD [place]

## 2022-07-13 DIAGNOSIS — Z880 Allergy status to penicillin: Secondary | ICD-10-CM | POA: Diagnosis not present

## 2022-07-13 DIAGNOSIS — E119 Type 2 diabetes mellitus without complications: Secondary | ICD-10-CM | POA: Diagnosis not present

## 2022-07-13 DIAGNOSIS — E876 Hypokalemia: Secondary | ICD-10-CM | POA: Diagnosis not present

## 2022-07-13 DIAGNOSIS — E785 Hyperlipidemia, unspecified: Secondary | ICD-10-CM | POA: Diagnosis not present

## 2022-07-13 DIAGNOSIS — I10 Essential (primary) hypertension: Secondary | ICD-10-CM | POA: Diagnosis not present

## 2022-07-13 DIAGNOSIS — Z6841 Body Mass Index (BMI) 40.0 and over, adult: Secondary | ICD-10-CM | POA: Diagnosis not present

## 2022-07-13 DIAGNOSIS — Z7985 Long-term (current) use of injectable non-insulin antidiabetic drugs: Secondary | ICD-10-CM | POA: Diagnosis not present

## 2022-07-13 DIAGNOSIS — Z7982 Long term (current) use of aspirin: Secondary | ICD-10-CM | POA: Diagnosis not present

## 2022-07-13 DIAGNOSIS — Z8249 Family history of ischemic heart disease and other diseases of the circulatory system: Secondary | ICD-10-CM | POA: Diagnosis not present

## 2022-08-06 ENCOUNTER — Other Ambulatory Visit: Payer: Self-pay | Admitting: Family Medicine

## 2022-08-30 ENCOUNTER — Other Ambulatory Visit: Payer: Self-pay | Admitting: Family Medicine

## 2022-09-27 ENCOUNTER — Other Ambulatory Visit: Payer: Self-pay | Admitting: Family Medicine

## 2022-09-28 NOTE — Telephone Encounter (Signed)
Requested Prescriptions  Pending Prescriptions Disp Refills   TRULICITY 1.5 TO/6.7TI SOPN [Pharmacy Med Name: Trulicity 1.5 WP/8.0DX Subcutaneous Solution Pen-injector] 4 mL 0    Sig: INJECT 1.'5MG'$  SUBCUTANEOUSLY ONCE A WEEK.Marland KitchenPLEASE KEEP UPCOMING APPOINTMENT FOR MORE REFILLS     Endocrinology:  Diabetes - GLP-1 Receptor Agonists Passed - 09/27/2022 11:36 AM      Passed - HBA1C is between 0 and 7.9 and within 180 days    HbA1c, POC (prediabetic range)  Date Value Ref Range Status  06/15/2018 5.8 5.7 - 6.4 % Final   HbA1c, POC (controlled diabetic range)  Date Value Ref Range Status  04/01/2022 6.1 0.0 - 7.0 % Final         Passed - Valid encounter within last 6 months    Recent Outpatient Visits           6 months ago Type 2 diabetes mellitus without complication, without long-term current use of insulin ([place])   [place], Enobong, MD   7 months ago Essential hypertension   [place], Jarome Matin, RPH-CPP   8 months ago Essential hypertension   [place], RPH-CPP   1 year ago Annual physical exam   [place], Charlane Ferretti, MD   1 year ago Type 2 diabetes mellitus without complication, without long-term current use of insulin [facility])   [place] Charlott Rakes, MD       Future Appointments             In 1 week Charlott Rakes, MD [place]

## 2022-10-05 ENCOUNTER — Encounter: Payer: Self-pay | Admitting: Family Medicine

## 2022-10-05 ENCOUNTER — Ambulatory Visit: Payer: 59 | Attending: Family Medicine | Admitting: Family Medicine

## 2022-10-05 VITALS — BP 159/91 | HR 68 | Ht 66.0 in | Wt 274.2 lb

## 2022-10-05 DIAGNOSIS — M5431 Sciatica, right side: Secondary | ICD-10-CM

## 2022-10-05 DIAGNOSIS — E1169 Type 2 diabetes mellitus with other specified complication: Secondary | ICD-10-CM

## 2022-10-05 DIAGNOSIS — E785 Hyperlipidemia, unspecified: Secondary | ICD-10-CM | POA: Diagnosis not present

## 2022-10-05 DIAGNOSIS — I152 Hypertension secondary to endocrine disorders: Secondary | ICD-10-CM

## 2022-10-05 DIAGNOSIS — E1159 Type 2 diabetes mellitus with other circulatory complications: Secondary | ICD-10-CM | POA: Diagnosis not present

## 2022-10-05 DIAGNOSIS — E119 Type 2 diabetes mellitus without complications: Secondary | ICD-10-CM

## 2022-10-05 LAB — POCT GLYCOSYLATED HEMOGLOBIN (HGB A1C): HbA1c, POC (controlled diabetic range): 5.9 % (ref 0.0–7.0)

## 2022-10-05 LAB — GLUCOSE, POCT (MANUAL RESULT ENTRY): POC Glucose: 95 mg/dl (ref 70–99)

## 2022-10-05 MED ORDER — TIZANIDINE HCL 4 MG PO TABS: 4.0000 mg | ORAL_TABLET | Freq: Three times a day (TID) | ORAL | 1 refills | Status: DC | PRN

## 2022-10-05 MED ORDER — MELOXICAM 7.5 MG PO TABS: 7.5000 mg | ORAL_TABLET | Freq: Every day | ORAL | 1 refills | Status: DC

## 2022-10-05 MED ORDER — LISINOPRIL-HYDROCHLOROTHIAZIDE 20-12.5 MG PO TABS: 2.0000 | ORAL_TABLET | Freq: Every day | ORAL | 1 refills | Status: DC

## 2022-10-05 MED ORDER — ATORVASTATIN CALCIUM 40 MG PO TABS: ORAL_TABLET | ORAL | 1 refills | Status: DC

## 2022-10-05 NOTE — Patient Instructions (Signed)

## 2022-10-05 NOTE — Progress Notes (Signed)
Weight gain. Patient wants to know if apple cider vinegar can be taken with Trulicity.

## 2022-10-05 NOTE — Progress Notes (Signed)
Subjective:  Patient ID: Emily Dickson, female    DOB: 1956-04-28  Age: 67 y.o. MRN: [id number]  CC: Hypertension and Diabetes   HPI Emily Dickson is a 67 y.o. year old female with a history of hypertension, hyperlipidemia, chronic rhinitis, right knee osteoarthritis, type 2 DM (A1c 5.9).   Interval History:  This morning she woke up with pain from her right butt radiating down to her right lower extremity with associated numbness and tingling.  Pain is absent at the moment but she states when she raises up from a sitting position this begins.  She has not taken anything for pain today.  She is unhappy about her weight gain of 17 pounds in the last 7 months.  While she was on Ozempic she did lose more weight and now on Trulicity she has started to gain weight.  She complains of shortness of breath when she exerts herself. Blood pressure is elevated and she endorses taking her antihypertensive today.  Past Medical History:  Diagnosis Date   Allergy    seasonal   Arthritis    Back pain    Blood transfusion without reported diagnosis    with child birth   Diabetes mellitus without complication ([place])    High cholesterol    Hypertension     No past surgical history on file.  Family History  Problem Relation Age of Onset   Colon cancer Neg Hx    Colon polyps Neg Hx    Esophageal cancer Neg Hx    Stomach cancer Neg Hx    Rectal cancer Neg Hx     Social History   Socioeconomic History   Marital status: Single    Spouse name: Not on file   Number of children: Not on file   Years of education: Not on file   Highest education level: Not on file  Occupational History   Not on file  Tobacco Use   Smoking status: Never   Smokeless tobacco: Never  Vaping Use   Vaping Use: Never used  Substance and Sexual Activity   Alcohol use: No   Drug use: No   Sexual activity: Yes    Partners: Male    Birth control/protection: None  Other Topics Concern   Not on file   Social History Narrative   Not on file   Social Determinants of Health   Financial Resource Strain: Not on file  Food Insecurity: Not on file  Transportation Needs: Not on file  Physical Activity: Not on file  Stress: Not on file  Social Connections: Not on file    Allergies  Allergen Reactions   Penicillins Rash    Has patient had a PCN reaction causing immediate rash, facial/tongue/throat swelling, SOB or lightheadedness with hypotension: No Has patient had a PCN reaction causing severe rash involving mucus membranes or skin necrosis: No Has patient had a PCN reaction that required hospitalization No Has patient had a PCN reaction occurring within the last 10 years: No If all of the above answers are "NO", then may proceed with Cephalosporin use.    Outpatient Medications Prior to Visit  Medication Sig Dispense Refill   aspirin EC 81 MG tablet Take 1 tablet (81 mg total) by mouth daily. 90 tablet 1   b complex vitamins capsule Take 1 capsule by mouth daily.     Blood Glucose Monitoring Suppl (ONETOUCH VERIO REFLECT) w/Device KIT 1 kit by Does not apply route 3 (three) times daily. Use as instructed  to check blood sugar three times daily. E11.9 1 kit 0   fluticasone (FLONASE) 50 MCG/ACT nasal spray Place 2 sprays into both nostrils daily. 16 g 6   Garlic 6203 MG CAPS Take by mouth.     glucose blood (ONETOUCH VERIO) test strip Use as instructed to check blood sugar three times daily. E11.9 100 each 12   GNP LORATADINE 10 MG tablet TAKE 1 TABLET (10 MG TOTAL) BY MOUTH DAILY. 30 tablet 5   Multiple Vitamin (MULTIVITAMIN WITH MINERALS) TABS tablet Take 1 tablet by mouth daily.     nitroGLYCERIN (NITROSTAT) 0.4 MG SL tablet PLACE 1 TABLET (0.4 MG TOTAL) UNDER THE TONGUE EVERY 5 (FIVE) MINUTES AS NEEDED FOR CHEST PAIN. 25 tablet 1   OneTouch Delica Lancets 55H MISC Use as instructed to check blood sugar three times daily. E11.9 100 each 11   potassium chloride (KLOR-CON) 10 MEQ  tablet Take 1 tablet by mouth once daily 90 tablet 3   TRULICITY 1.5 RC/1.6LA SOPN INJECT 1.'5MG'$  SUBCUTANEOUSLY ONCE A WEEK.Marland KitchenPLEASE KEEP UPCOMING APPOINTMENT FOR MORE REFILLS 4 mL 0   atorvastatin (LIPITOR) 40 MG tablet TAKE 1 TABLET BY MOUTH ONCE DAILY AT  6PM 90 tablet 1   lisinopril-hydrochlorothiazide (ZESTORETIC) 20-12.5 MG tablet Take 2 tablets by mouth daily. 180 tablet 1   Apple Cider Vinegar 500 MG TABS Take 500 mg by mouth one time only at 6 PM.     furosemide (LASIX) 20 MG tablet Take 1 tablet (20 mg total) by mouth daily as needed. (Patient not taking: Reported on 10/05/2022) 30 tablet 1   Lancets (ONETOUCH DELICA PLUS GTXMIW80H) MISC OneTouch Delica Plus Lancet 33 gauge     Facility-Administered Medications Prior to Visit  Medication Dose Route Frequency Provider Last Rate Last Admin   0.9 %  sodium chloride infusion  500 mL Intravenous Continuous Nandigam, Kavitha V, MD         ROS Review of Systems  Constitutional:  Negative for activity change and appetite change.  HENT:  Negative for sinus pressure and sore throat.   Respiratory:  Negative for chest tightness, shortness of breath and wheezing.   Cardiovascular:  Negative for chest pain and palpitations.  Gastrointestinal:  Negative for abdominal distention, abdominal pain and constipation.  Genitourinary: Negative.   Musculoskeletal:        See HPI  Psychiatric/Behavioral:  Negative for behavioral problems and dysphoric mood.     Objective:  BP (!) 159/91   Pulse 68   Ht '5\' 6"'$  (1.676 m)   Wt 274 lb 3.2 oz (124.4 kg)   SpO2 98%   BMI 44.26 kg/m      10/05/2022    8:44 AM 10/05/2022    8:41 AM 04/01/2022    8:45 AM  BP/Weight  Systolic BP 212 248 250  Diastolic BP 91 96 86  Wt. (Lbs)  274.2 257.6  BMI  44.26 kg/m2 41.58 kg/m2      Physical Exam Constitutional:      Appearance: She is well-developed.  Cardiovascular:     Rate and Rhythm: Normal rate.     Heart sounds: Normal heart sounds. No murmur  heard. Pulmonary:     Effort: Pulmonary effort is normal.     Breath sounds: Normal breath sounds. No wheezing or rales.  Chest:     Chest wall: No tenderness.  Abdominal:     General: Bowel sounds are normal. There is no distension.     Palpations: Abdomen is soft. There is  no mass.     Tenderness: There is no abdominal tenderness.  Musculoskeletal:        General: Normal range of motion.     Right lower leg: No edema.     Left lower leg: No edema.     Comments: Negative straight leg raise bilaterally  Neurological:     Mental Status: She is alert and oriented to person, place, and time.  Psychiatric:        Mood and Affect: Mood normal.        Latest Ref Rng & Units 04/01/2022    9:27 AM 02/06/2022    9:38 AM 08/05/2021    4:58 PM  CMP  Glucose 70 - 99 mg/dL 100  114  93   BUN 8 - 27 mg/dL '12  15  14   '$ Creatinine 0.57 - 1.00 mg/dL 0.69  0.81  0.67   Sodium 134 - 144 mmol/L 142  133  142   Potassium 3.5 - 5.2 mmol/L 4.1  3.9  4.1   Chloride 96 - 106 mmol/L 98  91  101   CO2 20 - 29 mmol/L '28  27  26   '$ Calcium 8.7 - 19.1 mg/dL 9.9  9.7  9.9   Total Protein 6.0 - 8.5 g/dL  7.0  7.5   Total Bilirubin 0.0 - 1.2 mg/dL  0.5  0.4   Alkaline Phos 44 - 121 IU/L  66  72   AST 0 - 40 IU/L  27  24   ALT 0 - 32 IU/L  26  22     Lipid Panel     Component Value Date/Time   CHOL 152 04/01/2022 0927   TRIG 84 04/01/2022 0927   HDL 60 04/01/2022 0927   CHOLHDL 2.7 12/11/2020 1017   CHOLHDL 2.6 03/11/2016 1043   VLDL 13 03/11/2016 1043   LDLCALC 76 04/01/2022 0927    CBC    Component Value Date/Time   WBC 4.2 05/20/2016 1525   RBC 4.42 05/20/2016 1525   HGB 12.2 05/20/2016 1525   HCT 38.0 05/20/2016 1525   PLT 181 05/20/2016 1525   MCV 86.0 05/20/2016 1525   MCH 27.6 05/20/2016 1525   MCHC 32.1 05/20/2016 1525   RDW 18.3 (H) 05/20/2016 1525   LYMPHSABS 1.5 06/18/2015 1815   MONOABS 0.8 06/18/2015 1815   EOSABS 0.1 06/18/2015 1815   BASOSABS 0.0 06/18/2015 1815     Lab Results  Component Value Date   HGBA1C 5.9 10/05/2022    Assessment & Plan:  1. Type 2 diabetes mellitus without complication, without long-term current use of insulin (HCC) Controlled with A1c of 5.9 Due to additional weight loss benefit we have checked with her insurance company to see if Darcel Bayley will be covered.  Once we obtain information regarding a decision we will contact her. She was previously on Ozempic and did lose weight with that compared to Trulicity Counseled on Diabetic diet, my plate method, 478 minutes of moderate intensity exercise/week Blood sugar logs with fasting goals of 80-120 mg/dl, random of less than 180 and in the event of sugars less than 60 mg/dl or greater than 400 mg/dl encouraged to notify the clinic. Advised on the need for annual eye exams, annual foot exams, Pneumonia vaccine. - POCT glycosylated hemoglobin (Hb A1C) - POCT glucose (manual entry) - Microalbumin / creatinine urine ratio - LP+Non-HDL Cholesterol - CMP14+EGFR  2. Sciatica of right side Uncontrolled Likely triggered by recent weight gain Discussed sciatica exercises -  tiZANidine (ZANAFLEX) 4 MG tablet; Take 1 tablet (4 mg total) by mouth every 8 (eight) hours as needed for muscle spasms.  Dispense: 60 tablet; Refill: 1 - meloxicam (MOBIC) 7.5 MG tablet; Take 1 tablet (7.5 mg total) by mouth daily.  Dispense: 30 tablet; Refill: 1  3. Hyperlipidemia associated with type 2 diabetes mellitus (HCC) Controlled Low-cholesterol diet - atorvastatin (LIPITOR) 40 MG tablet; TAKE 1 TABLET BY MOUTH ONCE DAILY AT  6PM  Dispense: 90 tablet; Refill: 1  4. Hypertension associated with diabetes ([place]) Uncontrolled Endorses adherence with medication I will see her back at her next visit to reassess blood pressure and adjust regimen accordingly Counseled on blood pressure goal of less than 130/80, low-sodium, DASH diet, medication compliance, 150 minutes of moderate intensity exercise per  week. Discussed medication compliance, adverse effects. - lisinopril-hydrochlorothiazide (ZESTORETIC) 20-12.5 MG tablet; Take 2 tablets by mouth daily.  Dispense: 180 tablet; Refill: 1  5. Morbid obesity ([place]) She does not exercise much outside of what she gets at her job We are not achieving desirable weight loss with Trulicity and she will be candidate for Darcel Bayley Will try to obtain prior authorization from her insurance    Meds ordered this encounter  Medications   tiZANidine (ZANAFLEX) 4 MG tablet    Sig: Take 1 tablet (4 mg total) by mouth every 8 (eight) hours as needed for muscle spasms.    Dispense:  60 tablet    Refill:  1   meloxicam (MOBIC) 7.5 MG tablet    Sig: Take 1 tablet (7.5 mg total) by mouth daily.    Dispense:  30 tablet    Refill:  1   atorvastatin (LIPITOR) 40 MG tablet    Sig: TAKE 1 TABLET BY MOUTH ONCE DAILY AT  6PM    Dispense:  90 tablet    Refill:  1   lisinopril-hydrochlorothiazide (ZESTORETIC) 20-12.5 MG tablet    Sig: Take 2 tablets by mouth daily.    Dispense:  180 tablet    Refill:  1    Follow-up: Return in about 1 month (around 11/05/2022) for Blood Pressure follow-up.       Charlott Rakes, MD, FAAFP. [facility] and [facility], [place]   10/05/2022, 9:41 AM

## 2022-10-06 ENCOUNTER — Other Ambulatory Visit: Payer: Self-pay

## 2022-10-07 LAB — CMP14+EGFR
ALT: 17 IU/L (ref 0–32)
AST: 17 IU/L (ref 0–40)
Albumin/Globulin Ratio: 1.5 (ref 1.2–2.2)
Albumin: 4.4 g/dL (ref 3.9–4.9)
Alkaline Phosphatase: 71 IU/L (ref 44–121)
BUN/Creatinine Ratio: 20 (ref 12–28)
BUN: 12 mg/dL (ref 8–27)
Bilirubin Total: 0.2 mg/dL (ref 0.0–1.2)
CO2: 25 mmol/L (ref 20–29)
Calcium: 9.9 mg/dL (ref 8.7–10.3)
Chloride: 100 mmol/L (ref 96–106)
Creatinine, Ser: 0.59 mg/dL (ref 0.57–1.00)
Globulin, Total: 2.9 g/dL (ref 1.5–4.5)
Glucose: 86 mg/dL (ref 70–99)
Potassium: 4.1 mmol/L (ref 3.5–5.2)
Sodium: 142 mmol/L (ref 134–144)
Total Protein: 7.3 g/dL (ref 6.0–8.5)
eGFR: 101 mL/min/{1.73_m2} (ref >59)

## 2022-10-07 LAB — LP+NON-HDL CHOLESTEROL
Cholesterol, Total: 164 mg/dL (ref 100–199)
HDL: 66 mg/dL (ref >39)
LDL Chol Calc (NIH): 86 mg/dL (ref 0–99)
Total Non-HDL-Chol (LDL+VLDL): 98 mg/dL (ref 0–129)
Triglycerides: 62 mg/dL (ref 0–149)
VLDL Cholesterol Cal: 12 mg/dL (ref 5–40)

## 2022-10-07 LAB — MICROALBUMIN / CREATININE URINE RATIO
Creatinine, Urine: 93.7 mg/dL
Microalb/Creat Ratio: 21 mg/g creat (ref 0–29)
Microalbumin, Urine: 19.3 ug/mL

## 2022-10-26 ENCOUNTER — Other Ambulatory Visit: Payer: Self-pay | Admitting: Family Medicine

## 2022-11-05 ENCOUNTER — Encounter: Payer: Self-pay | Admitting: Critical Care Medicine

## 2022-11-05 ENCOUNTER — Ambulatory Visit: Payer: 59 | Attending: Critical Care Medicine | Admitting: Critical Care Medicine

## 2022-11-05 VITALS — BP 220/120 | HR 67 | Ht 66.0 in | Wt 278.4 lb

## 2022-11-05 DIAGNOSIS — I160 Hypertensive urgency: Secondary | ICD-10-CM | POA: Diagnosis not present

## 2022-11-05 DIAGNOSIS — I10 Essential (primary) hypertension: Secondary | ICD-10-CM | POA: Diagnosis not present

## 2022-11-05 DIAGNOSIS — E119 Type 2 diabetes mellitus without complications: Secondary | ICD-10-CM | POA: Diagnosis not present

## 2022-11-05 DIAGNOSIS — Z6841 Body Mass Index (BMI) 40.0 and over, adult: Secondary | ICD-10-CM | POA: Diagnosis not present

## 2022-11-05 DIAGNOSIS — E782 Mixed hyperlipidemia: Secondary | ICD-10-CM | POA: Diagnosis not present

## 2022-11-05 MED ORDER — AMLODIPINE BESYLATE 10 MG PO TABS: 10.0000 mg | ORAL_TABLET | Freq: Every day | ORAL | 2 refills | Status: DC

## 2022-11-05 MED ORDER — TRUEPLUS LANCETS 28G MISC: 1 refills | Status: DC

## 2022-11-05 MED ORDER — CARVEDILOL 6.25 MG PO TABS: 6.2500 mg | ORAL_TABLET | Freq: Two times a day (BID) | ORAL | 3 refills | Status: DC

## 2022-11-05 MED ORDER — CLONIDINE HCL 0.1 MG PO TABS
0.1000 mg | ORAL_TABLET | Freq: Once | ORAL | Status: AC
  Administered 2022-11-05: 0.1 mg via ORAL

## 2022-11-05 MED ORDER — VALSARTAN-HYDROCHLOROTHIAZIDE 320-25 MG PO TABS: 1.0000 | ORAL_TABLET | Freq: Every day | ORAL | 3 refills | Status: DC

## 2022-11-05 MED ORDER — TRULICITY 3 MG/0.5ML ~~LOC~~ SOAJ: 3.0000 mg | SUBCUTANEOUS | 4 refills | Status: DC

## 2022-11-05 MED ORDER — TRUE METRIX BLOOD GLUCOSE TEST VI STRP: ORAL_STRIP | 12 refills | Status: DC

## 2022-11-05 NOTE — Assessment & Plan Note (Signed)
Hypertensive urgency secondary to lack of response to current medication profile Plan is to discontinue Zestoretic and begin valsartan HCT 320/25 daily and to begin carvedilol 3.125 twice daily and to begin amlodipine 10 mg daily  Patient was given 3 doses of 0.1 mg clonidine with partial response on blood pressure lowering  Patient will return to clinical pharmacy short-term and her primary care provider short-term

## 2022-11-05 NOTE — Patient Instructions (Signed)
Discontinue lisinopril HCT Begin valsartan HCT daily for blood pressure Begin carvedilol 1 pill twice daily for blood pressure Begin amlodipine 1 pill daily for blood pressure Increase Trulicity to 3 mg weekly No other medication changes True Metrix strips and lancets were refilled Follow the lifestyle medicine diet as prescribed in the handout and as below         Advice for Weight Management   -For most of [place] the best way to lose weight is by diet management. Generally speaking, diet management means consuming less calories intentionally which over time brings about progressive weight loss.  This can be achieved more effectively by avoiding ultra processed carbohydrates, processed meats, unhealthy fats.    It is critically important to know your numbers: how much calorie you are consuming and how much calorie you need. More importantly, our carbohydrates sources should be unprocessed naturally occurring  complex starch food items.  It is always important to balance nutrition also by  appropriate intake of proteins (mainly plant-based), healthy fats/oils, plenty of fruits and vegetables.    -The American College of Lifestyle Medicine (ACL M) recommends nutrition derived mostly from Whole Food, Plant Predominant Sources example an apple instead of applesauce or apple pie. Eat Plenty of vegetables, Mushrooms, fruits, Legumes, Whole Grains, Nuts, seeds in lieu of processed meats, processed snacks/pastries red meat, poultry, eggs.  Use only water or unsweetened tea for hydration.  The College also recommends the need to stay away from risky substances including alcohol, smoking; obtaining 7-9 hours of restorative sleep, at least 150 minutes of moderate intensity exercise weekly, importance of healthy social connections, and being mindful of stress and seek help when it is overwhelming.     -Sticking to a routine mealtime to eat 3 meals a day and avoiding unnecessary snacks is shown to have a big role in  weight control. Under normal circumstances, the only time we burn stored energy is when we are hungry, so allow  some hunger to take place- hunger means no food between appropriate meal times, only water.  It is not advisable to starve.    -It is better to avoid simple carbohydrates including: Cakes, Sweet Desserts, Ice Cream, Soda (diet and regular), Sweet Tea, Candies, Chips, Cookies, Store Bought Juices, Alcohol in Excess of  1-2 drinks a day, Lemonade,  Artificial Sweeteners, Doughnuts, Coffee Creamers, "Sugar-free" Products, etc, etc.  This is not a complete list...Marland Kitchen.    -Consulting with certified diabetes educators is proven to provide you with the most accurate and current information on diet.  Also, you may be  interested in discussing diet options/exchanges , we can schedule a visit with Jearld Fenton, RDN, CDE for individualized nutrition education.   -Exercise: If you are able: 30 -60 minutes a day ,4 days a week, or 150 minutes of moderate intensity exercise weekly.    The longer the better if tolerated.  Combine stretch, strength, and aerobic activities.  If you were told in the past that you have high risk for cardiovascular diseases, or if you are currently symptomatic, you may seek evaluation by your heart doctor prior to initiating moderate to intense exercise programs.                                    Additional Care Considerations for Diabetes/Prediabetes     -Diabetes  is a chronic disease.  The most important care consideration is regular follow-up with  your diabetes care provider with the goal being avoiding or delaying its complications and to take advantage of advances in medications and technology.  If appropriate actions are taken early enough, type 2 diabetes can even be reversed.  Seek information from the right source.   - Whole Food, Plant Predominant Nutrition is highly recommended: Eat Plenty of vegetables, Mushrooms, fruits, Legumes, Whole Grains, Nuts, seeds in lieu  of processed meats, processed snacks/pastries red meat, poultry, eggs as recommended by [employer] of  Lifestyle Medicine (ACLM).   -Type 2 diabetes is known to coexist with other important comorbidities such as high blood pressure and high cholesterol.  It is critical to control not only the diabetes but also the high blood pressure and high cholesterol to minimize and delay the risk of complications including coronary artery disease, stroke, amputations, blindness, etc.  The good news is that this diet recommendation for type 2 diabetes is also very helpful for managing high cholesterol and high blood blood pressure.   - Studies showed that people with diabetes will benefit from a class of medications known as ACE inhibitors and statins.  Unless there are specific reasons not to be on these medications, the standard of care is to consider getting one from these groups of medications at an optimal doses.  These medications are generally considered safe and proven to help protect the heart and the kidneys.     - People with diabetes are encouraged to initiate and maintain regular follow-up with eye doctors, foot doctors, dentists , and if necessary heart and kidney doctors.      - It is highly recommended that people with diabetes quit smoking or stay away from smoking, and get yearly  flu vaccine and pneumonia vaccine at least every 5 years.  See above for additional recommendations on exercise, sleep, stress management , and healthy social connections.    Return to see [facility] clinical pharmacist in 3 weeks and then see Dr. Margarita Rana in 6 weeks for blood pressure follow-up

## 2022-11-05 NOTE — Assessment & Plan Note (Signed)
The following Lifestyle Medicine recommendations according to [place] [facility]) were discussed and offered to patient who agrees to start the journey:  A. Whole Foods, Plant-based plate comprising of fruits and vegetables, plant-based proteins, whole-grain carbohydrates was discussed in detail with the patient.   A list for source of those nutrients were also provided to the patient.  Patient will use only water or unsweetened tea for hydration. B.  The need to stay away from risky substances including alcohol, smoking; obtaining 7 to 9 hours of restorative sleep, at least 150 minutes of moderate intensity exercise weekly, the importance of healthy social connections,  and stress reduction techniques were discussed. C.  A full color page of  Calorie density of various food groups per pound showing examples of each food groups was provided to the patient.

## 2022-11-05 NOTE — Assessment & Plan Note (Addendum)
Type 2 diabetes plan to increase Trulicity to 3 mg weekly due to increased weight and to continue with glucose monitoring  I reviewed with the patient lifestyle management handout including diet and exercise recommendations  The following Lifestyle Medicine recommendations according to [facility]) were discussed and offered to patient who agrees to start the journey:  A. Whole Foods, Plant-based plate comprising of fruits and vegetables, plant-based proteins, whole-grain carbohydrates was discussed in detail with the patient.   A list for source of those nutrients were also provided to the patient.  Patient will use only water or unsweetened tea for hydration. B.  The need to stay away from risky substances including alcohol, smoking; obtaining 7 to 9 hours of restorative sleep, at least 150 minutes of moderate intensity exercise weekly, the importance of healthy social connections,  and stress reduction techniques were discussed. C.  A full color page of  Calorie density of various food groups per pound showing examples of each food groups was provided to the patient.

## 2022-11-05 NOTE — Progress Notes (Signed)
High BP home readings. Medication refills. Would like True Metrix strips and lancets.

## 2022-11-05 NOTE — Progress Notes (Signed)
Established Patient Office Visit  Subjective   Patient ID: HARMONY NOREN, female    DOB: [birth year]  Age: 67 y.o. MRN: [id number]  Chief Complaint  Patient presents with   Hypertension   Headache    PCP [place] last seen 10/05/22 as below 1. Type 2 diabetes mellitus without complication, without long-term current use of insulin (HCC) Controlled with A1c of 5.9 Due to additional weight loss benefit we have checked with her insurance company to see if Darcel Bayley will be covered.  Once we obtain information regarding a decision we will contact her. She was previously on Ozempic and did lose weight with that compared to Trulicity Counseled on Diabetic diet, my plate method, [id number] minutes of moderate intensity exercise/week Blood sugar logs with fasting goals of 80-120 mg/dl, random of less than 180 and in the event of sugars less than 60 mg/dl or greater than 400 mg/dl encouraged to notify the clinic. Advised on the need for annual eye exams, annual foot exams, Pneumonia vaccine. - POCT glycosylated hemoglobin (Hb A1C) - POCT glucose (manual entry) - Microalbumin / creatinine urine ratio - LP+Non-HDL Cholesterol - CMP14+EGFR  2. Sciatica of right side Uncontrolled Likely triggered by recent weight gain Discussed sciatica exercises - tiZANidine (ZANAFLEX) 4 MG tablet; Take 1 tablet (4 mg total) by mouth every 8 (eight) hours as needed for muscle spasms.  Dispense: 60 tablet; Refill: 1 - meloxicam (MOBIC) 7.5 MG tablet; Take 1 tablet (7.5 mg total) by mouth daily.  Dispense: 30 tablet; Refill: 1  3. Hyperlipidemia associated with type 2 diabetes mellitus (HCC) Controlled Low-cholesterol diet - atorvastatin (LIPITOR) 40 MG tablet; TAKE 1 TABLET BY MOUTH ONCE DAILY AT  6PM  Dispense: 90 tablet; Refill: 1  4. Hypertension associated with diabetes ([place]) Uncontrolled Endorses adherence with medication I will see her back at her next visit to reassess blood pressure and adjust regimen  accordingly Counseled on blood pressure goal of less than 130/80, low-sodium, DASH diet, medication compliance, 150 minutes of moderate intensity exercise per week. Discussed medication compliance, adverse effects. - lisinopril-hydrochlorothiazide (ZESTORETIC) 20-12.5 MG tablet; Take 2 tablets by mouth daily.  Dispense: 180 tablet; Refill: 1  5. Morbid obesity ([employer]) She does not exercise much outside of what she gets at her job We are not achieving desirable weight loss with Trulicity and she will be candidate for Darcel Bayley Will try to obtain prior authorization from her insurance   Patient is seen today for follow-up visit medication refills and blood pressure reassessment.  Unfortunately on arrival blood pressure 220/120.  Patient states she is having headaches.  Only on the Zestoretic.  She states at home her blood pressure readings are a bit lower and she brings a record of these.  Typically they are in the 160-170/100 range.  She is also requesting insulin testing supplies.  From a diabetes perspective her sugars have been well-controlled.  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  HENT:  Negative for congestion, hearing loss, nosebleeds, sore throat and tinnitus.   Eyes:  Negative for blurred vision, photophobia and redness.  Respiratory:  Negative for cough, hemoptysis, sputum production, shortness of breath, wheezing and stridor.   Cardiovascular:  Negative for chest pain, palpitations, orthopnea, claudication, leg  swelling and PND.  Gastrointestinal:  Negative for abdominal pain, blood in stool, constipation, diarrhea, heartburn, nausea and vomiting.  Genitourinary:  Negative for dysuria, flank pain, frequency, hematuria and urgency.  Musculoskeletal:  Negative for  back pain, falls, joint pain, myalgias and neck pain.  Skin:  Negative for itching and rash.  Neurological:  Positive for headaches. Negative for dizziness, tingling, tremors, sensory change, speech change, focal weakness, seizures, loss of consciousness and weakness.  Endo/Heme/Allergies:  Negative for environmental allergies and polydipsia. Does not bruise/bleed easily.  Psychiatric/Behavioral:  Negative for depression, memory loss, substance abuse and suicidal ideas. The patient is not nervous/anxious and does not have insomnia.       Objective:     BP (!) 220/120   Pulse 67   Ht '5\' 6"'$  (1.676 m)   Wt 278 lb 6.4 oz (126.3 kg)   SpO2 98%   BMI 44.93 kg/m    Physical Exam Vitals reviewed.  Constitutional:      Appearance: Normal appearance. She is well-developed. She is obese. She is not diaphoretic.  HENT:     Head: Normocephalic and atraumatic.     Nose: No nasal deformity, septal deviation, mucosal edema or rhinorrhea.     Right Sinus: No maxillary sinus tenderness or frontal sinus tenderness.     Left Sinus: No maxillary sinus tenderness or frontal sinus tenderness.     Mouth/Throat:     Pharynx: No oropharyngeal exudate.  Eyes:     General: No scleral icterus.    Conjunctiva/sclera: Conjunctivae normal.     Pupils: Pupils are equal, round, and reactive to light.  Neck:     Thyroid: No thyromegaly.     Vascular: No carotid bruit or JVD.     Trachea: Trachea normal. No tracheal tenderness or tracheal deviation.  Cardiovascular:     Rate and Rhythm: Normal rate and regular rhythm.     Chest Wall: PMI is not displaced.     Pulses: Normal pulses. No decreased pulses.     Heart sounds: Normal heart sounds, S1 normal and S2 normal. Heart sounds not distant. No murmur heard.    No systolic murmur is present.     No diastolic murmur is present.     No friction rub. No gallop. No S3 or S4 sounds.  Pulmonary:     Effort: No tachypnea, accessory muscle usage or respiratory  distress.     Breath sounds: No stridor. No decreased breath sounds, wheezing, rhonchi or rales.  Chest:     Chest wall: No tenderness.  Abdominal:     General: Bowel sounds are normal. There is no distension.     Palpations: Abdomen is soft. Abdomen is not rigid.     Tenderness: There is no abdominal tenderness. There is no guarding or rebound.  Musculoskeletal:        General: Normal range of motion.     Cervical back: Normal range of motion and neck supple. No edema, erythema or rigidity. No muscular tenderness. Normal range of motion.  Lymphadenopathy:     Head:     Right side of head: No submental or submandibular adenopathy.     Left side of head: No submental or submandibular adenopathy.     Cervical: No cervical adenopathy.  Skin:    General: Skin is warm and dry.     Coloration: Skin is not pale.     Findings: No rash.     Nails: There is no clubbing.  Neurological:  Mental Status: She is alert and oriented to person, place, and time.     Sensory: No sensory deficit.  Psychiatric:        Speech: Speech normal.        Behavior: Behavior normal.      No results found for any visits on 11/05/22.    The ASCVD Risk score (Arnett DK, et al., 2019) failed to calculate for the following reasons:   The valid systolic blood pressure range is 90 to 200 mmHg    Assessment & Plan:   Problem List Items Addressed This Visit       Cardiovascular and Mediastinum   Hypertensive urgency - Primary    Hypertensive urgency secondary to lack of response to current medication profile Plan is to discontinue Zestoretic and begin valsartan HCT 320/25 daily and to begin carvedilol 3.125 twice daily and to begin amlodipine 10 mg daily  Patient was given 3 doses of 0.1 mg clonidine with partial response on blood pressure lowering  Patient will return to clinical pharmacy short-term and her primary care provider short-term        Relevant Medications   amLODipine (NORVASC) 10 MG  tablet   valsartan-hydrochlorothiazide (DIOVAN-HCT) 320-25 MG tablet   carvedilol (COREG) 6.25 MG tablet     Endocrine   Type 2 diabetes mellitus without complication, without long-term current use of insulin (HCC)    Type 2 diabetes plan to increase Trulicity to 3 mg weekly due to increased weight and to continue with glucose monitoring  I reviewed with the patient lifestyle management handout including diet and exercise recommendations  The following Lifestyle Medicine recommendations according to [place] of Lifestyle Medicine [facility]) were discussed and offered to patient who agrees to start the journey:  A. Whole Foods, Plant-based plate comprising of fruits and vegetables, plant-based proteins, whole-grain carbohydrates was discussed in detail with the patient.   A list for source of those nutrients were also provided to the patient.  Patient will use only water or unsweetened tea for hydration. B.  The need to stay away from risky substances including alcohol, smoking; obtaining 7 to 9 hours of restorative sleep, at least 150 minutes of moderate intensity exercise weekly, the importance of healthy social connections,  and stress reduction techniques were discussed. C.  A full color page of  Calorie density of various food groups per pound showing examples of each food groups was provided to the patient.       Relevant Medications   valsartan-hydrochlorothiazide (DIOVAN-HCT) 320-25 MG tablet   Dulaglutide (TRULICITY) 3 0000000 SOPN     Other   HLD (hyperlipidemia)    Continue with atorvastatin      Relevant Medications   amLODipine (NORVASC) 10 MG tablet   valsartan-hydrochlorothiazide (DIOVAN-HCT) 320-25 MG tablet   carvedilol (COREG) 6.25 MG tablet   Class 3 severe obesity due to excess calories without serious comorbidity with body mass index (BMI) of 40.0 to 44.9 in adult [facility])    The following Lifestyle Medicine recommendations according to [place] of  Lifestyle Medicine [facility]) were discussed and offered to patient who agrees to start the journey:  A. Whole Foods, Plant-based plate comprising of fruits and vegetables, plant-based proteins, whole-grain carbohydrates was discussed in detail with the patient.   A list for source of those nutrients were also provided to the patient.  Patient will use only water or unsweetened tea for hydration. B.  The need to stay away from risky substances including alcohol, smoking; obtaining  7 to 9 hours of restorative sleep, at least 150 minutes of moderate intensity exercise weekly, the importance of healthy social connections,  and stress reduction techniques were discussed. C.  A full color page of  Calorie density of various food groups per pound showing examples of each food groups was provided to the patient.       Relevant Medications   Dulaglutide (TRULICITY) 3 0000000 SOPN   35 minutes spent extra time needed for patient education and assessment of blood pressure Return in about 6 weeks (around 12/17/2022).    Asencion Noble, MD

## 2022-11-05 NOTE — Assessment & Plan Note (Signed)
Continue with atorvastatin

## 2022-11-29 ENCOUNTER — Other Ambulatory Visit: Payer: Self-pay | Admitting: Family Medicine

## 2022-11-29 DIAGNOSIS — M5431 Sciatica, right side: Secondary | ICD-10-CM

## 2022-12-10 ENCOUNTER — Encounter: Payer: Self-pay | Admitting: Pharmacist

## 2022-12-10 ENCOUNTER — Ambulatory Visit: Payer: 59 | Attending: Family Medicine | Admitting: Pharmacist

## 2022-12-10 VITALS — BP 128/69 | HR 65

## 2022-12-10 DIAGNOSIS — I10 Essential (primary) hypertension: Secondary | ICD-10-CM | POA: Diagnosis not present

## 2022-12-10 MED ORDER — CARVEDILOL 3.125 MG PO TABS: 3.1250 mg | ORAL_TABLET | Freq: Two times a day (BID) | ORAL | 1 refills | Status: DC

## 2022-12-10 MED ORDER — AMLODIPINE BESYLATE 5 MG PO TABS: 5.0000 mg | ORAL_TABLET | Freq: Every day | ORAL | 1 refills | Status: DC

## 2022-12-10 NOTE — Progress Notes (Signed)
S:    PCP: Dr. Margarita Rana   No chief complaint on file.  Emily Dickson is a 67 y.o. female who presents for hypertension evaluation, education, and management. PMH is significant for HTN, T2DM, HLD, obesity. Patient was referred and last seen by Dr. Joya Gaskins on 11/05/2022. BP was 220/120 at that visit. She endorsed compliance with lisinopril-HCTZ at that time. This was changed to valsartan-HCTZ. Amlodipine and carvedilol were started as well.   Today, patient arrives in good spirits and presents without assistance. Endorses dizziness, BLE edema, and fatigue since her last visit. She has some objective hypotension at home and I have noted this.   Patient reports hypertension is longstanding.  Family/Social history:  Fhx: no pertinent positives  Tobacco: never smoker  Alcohol: denies use   Medication adherence reported. Patient has taken BP medications today.   Current antihypertensives include: valsartan-HCTZ 320/25 mg daily, carvedilol 3.125 mg BID, amlodipine 10 mg daily   Reported home BP readings:  - Takes at her job  - Gives readings as follows:  - SBP range: 92 - 141 - DBP range: 52 - 85 - Most readings are in the low 100s-110s/60s-70s  Patient reported dietary habits:  - Compliant with salt restriction  - Denies excessive intake of caffeine  -Endorses poor PO intake d/t recent deaths in the family. Has a funeral today and tomorrow.   Patient-reported exercise habits:  - Treadmill at gym: 1 day/week for an hour at a time  - Instructions given for a target of 150s mins/week  O:  Vitals:   12/10/22 1630  BP: 128/69  Pulse: 65   Last 3 Office BP readings: BP Readings from Last 3 Encounters:  12/10/22 128/69  11/05/22 (!) 220/120  10/05/22 (!) 159/91   BMET    Component Value Date/Time   NA 142 10/05/2022 0929   K 4.1 10/05/2022 0929   CL 100 10/05/2022 0929   CO2 25 10/05/2022 0929   GLUCOSE 86 10/05/2022 0929   GLUCOSE 102 (H) 09/21/2016 1100   BUN 12  10/05/2022 0929   CREATININE 0.59 10/05/2022 0929   CREATININE 0.54 09/21/2016 1100   CALCIUM 9.9 10/05/2022 0929   GFRNONAA 94 04/11/2020 0850   GFRNONAA >89 09/21/2016 1100   GFRAA 108 04/11/2020 0850   GFRAA >89 09/21/2016 1100    Renal function: CrCl cannot be calculated (Patient's most recent lab result is older than the maximum 21 days allowed.).  Clinical ASCVD: No  The 10-year ASCVD risk score (Arnett DK, et al., 2019) is: 15.6%   Values used to calculate the score:     Age: 9 years     Sex: Female     Is Non-Hispanic African American: Yes     Diabetic: Yes     Tobacco smoker: No     Systolic Blood Pressure: 0000000 mmHg     Is BP treated: Yes     HDL Cholesterol: 66 mg/dL     Total Cholesterol: 164 mg/dL  A/P: Hypertension longstanding currently borderline hypotensive on current medications. BP goal < 130/80 mmHg. Medication adherence appears appropriate. She endorses LE edema, fatigue, and dizziness since her last visit. Recommend to decrease amlodipine and carvedilol dose.  -Decrease amlodipine to 5 mg daily.  -Decrease carvedilol to 3.125 mg BID -Continue valsartan-HCTZ 320-25 mg daily -Counseled on lifestyle modifications for blood pressure control including reduced dietary sodium, increased exercise, adequate sleep. -Counseled regarding symptoms of hypotension, orthostasis.  -Encouraged patient to check BP at home and bring  log of readings to next visit. Counseled on proper use of home BP cuff.   Results reviewed and written information provided. Patient verbalized understanding of treatment plan. Total time in face-to-face counseling 30 minutes.   F/u clinic visit 12/17/22 w/ Dr. Margarita Rana.  Benard Halsted, PharmD, [place], [place] [phone]

## 2022-12-17 ENCOUNTER — Other Ambulatory Visit: Payer: Self-pay

## 2022-12-17 ENCOUNTER — Ambulatory Visit: Payer: 59 | Attending: Family Medicine | Admitting: Family Medicine

## 2022-12-17 ENCOUNTER — Encounter: Payer: Self-pay | Admitting: Family Medicine

## 2022-12-17 VITALS — BP 164/93 | HR 71 | Temp 98.5°F | Ht 67.0 in | Wt 286.2 lb

## 2022-12-17 DIAGNOSIS — M5431 Sciatica, right side: Secondary | ICD-10-CM

## 2022-12-17 DIAGNOSIS — I152 Hypertension secondary to endocrine disorders: Secondary | ICD-10-CM | POA: Diagnosis not present

## 2022-12-17 DIAGNOSIS — R600 Localized edema: Secondary | ICD-10-CM

## 2022-12-17 DIAGNOSIS — E1142 Type 2 diabetes mellitus with diabetic polyneuropathy: Secondary | ICD-10-CM | POA: Diagnosis not present

## 2022-12-17 DIAGNOSIS — E1159 Type 2 diabetes mellitus with other circulatory complications: Secondary | ICD-10-CM | POA: Diagnosis not present

## 2022-12-17 DIAGNOSIS — J3089 Other allergic rhinitis: Secondary | ICD-10-CM

## 2022-12-17 MED ORDER — TIRZEPATIDE 10 MG/0.5ML ~~LOC~~ SOAJ
10.0000 mg | SUBCUTANEOUS | 6 refills | Status: DC
  Filled 2022-12-17 (×2): qty 6, 84d supply, fill #0

## 2022-12-17 MED ORDER — NITROGLYCERIN 0.4 MG SL SUBL: 0.4000 mg | SUBLINGUAL_TABLET | SUBLINGUAL | 1 refills | Status: DC | PRN

## 2022-12-17 MED ORDER — CARVEDILOL 6.25 MG PO TABS
6.2500 mg | ORAL_TABLET | Freq: Two times a day (BID) | ORAL | 1 refills | Status: DC
Start: 2022-12-17 — End: 2023-04-12

## 2022-12-17 MED ORDER — FUROSEMIDE 20 MG PO TABS: 20.0000 mg | ORAL_TABLET | Freq: Every day | ORAL | 3 refills | Status: DC

## 2022-12-17 MED ORDER — TIRZEPATIDE 7.5 MG/0.5ML ~~LOC~~ SOAJ
7.5000 mg | SUBCUTANEOUS | 0 refills | Status: DC
  Filled 2022-12-17 (×2): qty 2, 28d supply, fill #0

## 2022-12-17 MED ORDER — MELOXICAM 7.5 MG PO TABS: 7.5000 mg | ORAL_TABLET | Freq: Every day | ORAL | 1 refills | Status: DC

## 2022-12-17 MED ORDER — LORATADINE 10 MG PO TABS: 10.0000 mg | ORAL_TABLET | Freq: Every day | ORAL | 1 refills | Status: DC

## 2022-12-17 NOTE — Patient Instructions (Signed)
Tirzepatide Injection What is this medication? TIRZEPATIDE (tir ZEP a tide) treats type 2 diabetes. It works by increasing insulin levels in your body, which decreases your blood sugar (glucose). Changes to diet and exercise are often combined with this medication. This medicine may be used for other purposes; ask your health care provider or pharmacist if you have questions. COMMON BRAND NAME(S): MOUNJARO What should I tell my care team before I take this medication? They need to know if you have any of these conditions: Endocrine tumors (MEN 2) or if someone in your family had these tumors Eye disease, vision problems Gallbladder disease History of pancreatitis Kidney disease Stomach or intestine problems Thyroid cancer or if someone in your family had thyroid cancer An unusual or allergic reaction to tirzepatide, other medications, foods, dyes, or preservatives Pregnant or trying to get pregnant Breast-feeding How should I use this medication? This medication is injected under the skin. You will be taught how to prepare and give it. It is given once every week (every 7 days). Keep taking it unless your health care provider tells you to stop. If you use this medication with insulin, you should inject this medication and the insulin separately. Do not mix them together. Do not give the injections right next to each other. Change (rotate) injection sites with each injection. This medication comes with INSTRUCTIONS FOR USE. Ask your pharmacist for directions on how to use this medication. Read the information carefully. Talk to your pharmacist or care team if you have questions. It is important that you put your used needles and syringes in a special sharps container. Do not put them in a trash can. If you do not have a sharps container, call your pharmacist or care team to get one. A special MedGuide will be given to you by the pharmacist with each prescription and refill. Be sure to read this  information carefully each time. Talk to your care team about the use of this medication in children. Special care may be needed. Overdosage: If you think you have taken too much of this medicine contact a poison control center or emergency room at once. NOTE: This medicine is only for you. Do not share this medicine with others. What if I miss a dose? If you miss a dose, take it as soon as you can unless it is more than 4 days (96 hours) late. If it is more than 4 days late, skip the missed dose. Take the next dose at the normal time. Do not take 2 doses within 3 days of each other. What may interact with this medication? Alcohol Antiviral medications for HIV or AIDS Aspirin and aspirin-like medications Beta blockers, such as atenolol, metoprolol, propranolol Certain medications for blood pressure, heart disease, irregular heart beat Chromium Clonidine Diuretics Estrogen or progestin hormones Fenofibrate Gemfibrozil Guanethidine Isoniazid Lanreotide Female hormones or anabolic steroids MAOIs, such as Marplan, Nardil, and Parnate Medications for weight loss Medications for allergies, asthma, cold, or cough Medications for depression, anxiety, or mental health conditions Niacin Nicotine NSAIDs, medications for pain and inflammation, such as ibuprofen or naproxen Octreotide Other medications for diabetes, such as glyburide, glipizide, or glimepiride Pasireotide Pentamidine Phenytoin Probenecid Quinolone antibiotics, such as ciprofloxacin, levofloxacin, ofloxacin Reserpine Some herbal dietary supplements Steroid medications, such as prednisone or cortisone Sulfamethoxazole; trimethoprim Thyroid hormones Warfarin This list may not describe all possible interactions. Give your health care provider a list of all the medicines, herbs, non-prescription drugs, or dietary supplements you use. Also tell them   if you smoke, drink alcohol, or use illegal drugs. Some items may interact with  your medicine. What should I watch for while using this medication? Visit your care team for regular checks on your progress. Drink plenty of fluids while taking this medication. Check with your care team if you get an attack of severe diarrhea, nausea, and vomiting. The loss of too much body fluid can make it dangerous for you to take this medication. A test called the HbA1C (A1C) will be monitored. This is a simple blood test. It measures your blood sugar control over the last 2 to 3 months. You will receive this test every 3 to 6 months. Learn how to check your blood sugar. Learn the symptoms of low and high blood sugar and how to manage them. Always carry a quick-source of sugar with you in case you have symptoms of low blood sugar. Examples include hard sugar candy or glucose tablets. Make sure others know that you can choke if you eat or drink when you develop serious symptoms of low blood sugar, such as seizures or unconsciousness. They must get medical help at once. Tell your care team if you have high blood sugar. You might need to change the dose of your medication. If you are sick or exercising more than usual, you might need to change the dose of your medication. Do not skip meals. Ask your care team if you should avoid alcohol. Many nonprescription cough and cold products contain sugar or alcohol. These can affect blood sugar. Pens should never be shared. Even if the needle is changed, sharing may result in passing of viruses like hepatitis or HIV. Wear a medical ID bracelet or chain, and carry a card that describes your disease and details of your medication and dosage times. Birth control may not work properly while you are taking this medication. If you take birth control pills by mouth, your care team may recommend another type of birth control for 4 weeks after you start this medication and for 4 weeks after each increase in your dose of this medication. Ask your care team which birth  control methods you should use. What side effects may I notice from receiving this medication? Side effects that you should report to your care team as soon as possible: Allergic reactions or angioedema--skin rash, itching or hives, swelling of the face, eyes, lips, tongue, arms, or legs, trouble swallowing or breathing Change in vision Dehydration--increased thirst, dry mouth, feeling faint or lightheaded, headache, dark yellow or brown urine Gallbladder problems--severe stomach pain, nausea, vomiting, fever Kidney injury--decrease in the amount of urine, swelling of the ankles, hands, or feet Pancreatitis--severe stomach pain that spreads to your back or gets worse after eating or when touched, fever, nausea, vomiting Thyroid cancer--new mass or lump in the neck, pain or trouble swallowing, trouble breathing, hoarseness Side effects that usually do not require medical attention (report these to your care team if they continue or are bothersome): Constipation Diarrhea Loss of appetite Nausea Stomach pain Upset stomach Vomiting This list may not describe all possible side effects. Call your doctor for medical advice about side effects. You may report side effects to FDA at 1-800-FDA-1088. Where should I keep my medication? Keep out of the reach of children and pets. Refrigeration (preferred): Store in the refrigerator. Keep this medication in the original carton until you are ready to take it. Do not freeze. Protect from light. Get rid of opened vials after use, even if there is medication   left. Get rid of any unopened vials or pens after the expiration date. Room Temperature: This medication may be stored at room temperature below 30 degrees C (86 degrees F) for up to 21 days. Keep this medication in the original carton until you are ready to take it. Protect from light. Avoid exposure to extreme heat. Get rid of opened vials after use, even if there is medication left. Get rid of any unopened  vials or pens after 21 days, or after they expire, whichever is first. To get rid of medications that are no longer needed or have expired: Take the medication to a medication take-back program. Check with your pharmacy or law enforcement to find a location. If you cannot return the medication, ask your pharmacist or care team how to get rid of this medication safely. NOTE: This sheet is a summary. It may not cover all possible information. If you have questions about this medicine, talk to your doctor, pharmacist, or health care provider.  2023 Elsevier/Gold Standard (2022-07-09 00:00:00)  

## 2022-12-17 NOTE — Progress Notes (Signed)
Discuss  weight

## 2022-12-17 NOTE — Progress Notes (Signed)
Subjective:  Patient ID: Emily Dickson, female    DOB: [birth year]  Age: 67 y.o. MRN: [id number]  CC: Hypertension   HPI Cathyann A Rothbauer is a 67 y.o. year old female with a history of hypertension, hyperlipidemia, chronic rhinitis, right knee osteoarthritis, type 2 DM (A1c 5.9).   Interval History: Today her blood pressure is elevated. She states BP at home was 131/83 and log reveal values of 117-131/63-82.  She Complains of pedal edema.  Of note amlodipine was commenced at her visit in 10/2022 when she saw Dr. Delford Field and was found to have hypertensive urgency, carvedilol was also added.  At her visit with the clinical pharmacist last week she had complained of hypotension with some readings as low as 92/52.  Amlodipine dose was decreased and carvedilol was decreased.  She is unhappy about her weight gain and has gained 12 pounds in the last 3 months.  She was previously on Ozempic but insurance would only cover Trulicity.  Her weight gain is causing her knees to hurt more causing her to be more out of breath. She is requesting refill of her allergy pills and refill of nitroglycerin. Past Medical History:  Diagnosis Date   Allergy    seasonal   Arthritis    Back pain    Blood transfusion without reported diagnosis    with child birth   Diabetes mellitus without complication    High cholesterol    Hypertension     No past surgical history on file.  Family History  Problem Relation Age of Onset   Colon cancer Neg Hx    Colon polyps Neg Hx    Esophageal cancer Neg Hx    Stomach cancer Neg Hx    Rectal cancer Neg Hx     Social History   Socioeconomic History   Marital status: Single    Spouse name: Not on file   Number of children: Not on file   Years of education: Not on file   Highest education level: Not on file  Occupational History   Not on file  Tobacco Use   Smoking status: Never   Smokeless tobacco: Never  Vaping Use   Vaping Use: Never used   Substance and Sexual Activity   Alcohol use: No   Drug use: No   Sexual activity: Yes    Partners: Male    Birth control/protection: None  Other Topics Concern   Not on file  Social History Narrative   Not on file   Social Determinants of Health   Financial Resource Strain: Low Risk  (12/10/2022)   Overall Financial Resource Strain (CARDIA)    Difficulty of Paying Living Expenses: Not very hard  Food Insecurity: No Food Insecurity (12/10/2022)   Hunger Vital Sign    Worried About Running Out of Food in the Last Year: Never true    Ran Out of Food in the Last Year: Never true  Transportation Needs: No Transportation Needs (12/10/2022)   PRAPARE - Administrator, Civil Service (Medical): No    Lack of Transportation (Non-Medical): No  Physical Activity: Sufficiently Active (12/10/2022)   Exercise Vital Sign    Days of Exercise per Week: 5 days    Minutes of Exercise per Session: 30 min  Stress: No Stress Concern Present (12/10/2022)   [employer] of Occupational Health - Occupational Stress Questionnaire    Feeling of Stress : Only a little  Social Connections: Moderately Integrated (12/10/2022)   Social Connection  and Isolation Panel [NHANES]    Frequency of Communication with Friends and Family: More than three times a week    Frequency of Social Gatherings with Friends and Family: More than three times a week    Attends Religious Services: More than 4 times per year    Active Member of [employer] or Organizations: Yes    Attends Engineer, structural: More than 4 times per year    Marital Status: Divorced    Allergies  Allergen Reactions   Penicillins Rash    Has patient had a PCN reaction causing immediate rash, facial/tongue/throat swelling, SOB or lightheadedness with hypotension: No Has patient had a PCN reaction causing severe rash involving mucus membranes or skin necrosis: No Has patient had a PCN reaction that required hospitalization No Has patient  had a PCN reaction occurring within the last 10 years: No If all of the above answers are "NO", then may proceed with Cephalosporin use.    Outpatient Medications Prior to Visit  Medication Sig Dispense Refill   Apple Cider Vinegar 500 MG TABS Take 500 mg by mouth one time only at 6 PM.     aspirin EC 81 MG tablet Take 1 tablet (81 mg total) by mouth daily. 90 tablet 1   atorvastatin (LIPITOR) 40 MG tablet TAKE 1 TABLET BY MOUTH ONCE DAILY AT  6PM 90 tablet 1   b complex vitamins capsule Take 1 capsule by mouth daily.     Blood Glucose Monitoring Suppl (ONETOUCH VERIO REFLECT) w/Device KIT 1 kit by Does not apply route 3 (three) times daily. Use as instructed to check blood sugar three times daily. E11.9 1 kit 0   Dulaglutide (TRULICITY) 3 MG/0.5ML SOPN Inject 3 mg as directed once a week. 2 mL 4   fluticasone (FLONASE) 50 MCG/ACT nasal spray Place 2 sprays into both nostrils daily. 16 g 6   Garlic 1000 MG CAPS Take by mouth.     glucose blood (TRUE METRIX BLOOD GLUCOSE TEST) test strip Use as instructed 100 each 12   Multiple Vitamin (MULTIVITAMIN WITH MINERALS) TABS tablet Take 1 tablet by mouth daily.     potassium chloride (KLOR-CON) 10 MEQ tablet Take 1 tablet by mouth once daily 90 tablet 3   tiZANidine (ZANAFLEX) 4 MG tablet Take 1 tablet (4 mg total) by mouth every 8 (eight) hours as needed for muscle spasms. 60 tablet 1   TRUEplus Lancets 28G MISC Use to measure blood sugar twice a day 100 each 1   valsartan-hydrochlorothiazide (DIOVAN-HCT) 320-25 MG tablet Take 1 tablet by mouth daily. 90 tablet 3   amLODipine (NORVASC) 5 MG tablet Take 1 tablet (5 mg total) by mouth daily. 90 tablet 1   carvedilol (COREG) 3.125 MG tablet Take 1 tablet (3.125 mg total) by mouth 2 (two) times daily with a meal. 180 tablet 1   GNP LORATADINE 10 MG tablet TAKE 1 TABLET (10 MG TOTAL) BY MOUTH DAILY. 30 tablet 5   meloxicam (MOBIC) 7.5 MG tablet Take 1 tablet (7.5 mg total) by mouth daily. 30 tablet 1    nitroGLYCERIN (NITROSTAT) 0.4 MG SL tablet PLACE 1 TABLET (0.4 MG TOTAL) UNDER THE TONGUE EVERY 5 (FIVE) MINUTES AS NEEDED FOR CHEST PAIN. 25 tablet 1   Facility-Administered Medications Prior to Visit  Medication Dose Route Frequency Provider Last Rate Last Admin   0.9 %  sodium chloride infusion  500 mL Intravenous Continuous Nandigam, Eleonore Chiquito, MD  ROS Review of Systems  Constitutional:  Positive for unexpected weight change. Negative for activity change and appetite change.  HENT:  Negative for sinus pressure and sore throat.   Respiratory:  Negative for chest tightness, shortness of breath and wheezing.   Cardiovascular:  Negative for chest pain and palpitations.  Gastrointestinal:  Negative for abdominal distention, abdominal pain and constipation.  Genitourinary: Negative.   Musculoskeletal: Negative.   Psychiatric/Behavioral:  Negative for behavioral problems and dysphoric mood.     Objective:  BP (!) 164/93   Pulse 71   Temp 98.5 F (36.9 C) (Oral)   Ht 5\' 7"  (1.702 m)   Wt 286 lb 3.2 oz (129.8 kg)   SpO2 97%   BMI 44.83 kg/m      12/17/2022   11:00 AM 12/17/2022   10:12 AM 12/10/2022    4:30 PM  BP/Weight  Systolic BP 164 176 128  Diastolic BP 93 90 69  Wt. (Lbs)  286.2   BMI  44.83 kg/m2     Wt Readings from Last 3 Encounters:  12/17/22 286 lb 3.2 oz (129.8 kg)  11/05/22 278 lb 6.4 oz (126.3 kg)  10/05/22 274 lb 3.2 oz (124.4 kg)      Physical Exam Constitutional:      Appearance: She is well-developed. She is obese.  Cardiovascular:     Rate and Rhythm: Normal rate.     Heart sounds: Normal heart sounds. No murmur heard. Pulmonary:     Effort: Pulmonary effort is normal.     Breath sounds: Normal breath sounds. No wheezing or rales.  Chest:     Chest wall: No tenderness.  Abdominal:     General: Bowel sounds are normal. There is no distension.     Palpations: Abdomen is soft. There is no mass.     Tenderness: There is no abdominal  tenderness.  Musculoskeletal:        General: Normal range of motion.     Right lower leg: No edema.     Left lower leg: No edema.  Neurological:     Mental Status: She is alert and oriented to person, place, and time.  Psychiatric:        Mood and Affect: Mood normal.        Latest Ref Rng & Units 10/05/2022    9:29 AM 04/01/2022    9:27 AM 02/06/2022    9:38 AM  CMP  Glucose 70 - 99 mg/dL 86  161100  [id number]   BUN 8 - 27 mg/dL 12  12  15    Creatinine 0.57 - 1.00 mg/dL 0.450.59  4.090.69  8.110.81   Sodium 134 - 144 mmol/L 142  142  133   Potassium 3.5 - 5.2 mmol/L 4.1  4.1  3.9   Chloride 96 - 106 mmol/L 100  98  91   CO2 20 - 29 mmol/L 25  28  27    Calcium 8.7 - 10.3 mg/dL 9.9  9.9  9.7   Total Protein 6.0 - 8.5 g/dL 7.3   7.0   Total Bilirubin 0.0 - 1.2 mg/dL 0.2   0.5   Alkaline Phos 44 - 121 IU/L 71   66   AST 0 - 40 IU/L 17   27   ALT 0 - 32 IU/L 17   26     Lipid Panel     Component Value Date/Time   CHOL 164 10/05/2022 0929   TRIG 62 10/05/2022 0929   HDL 66 10/05/2022  0929   CHOLHDL 2.7 12/11/2020 1017   CHOLHDL 2.6 03/11/2016 1043   VLDL 13 03/11/2016 1043   LDLCALC 86 10/05/2022 0929    CBC    Component Value Date/Time   WBC 4.2 05/20/2016 1525   RBC 4.42 05/20/2016 1525   HGB 12.2 05/20/2016 1525   HCT 38.0 05/20/2016 1525   PLT 181 05/20/2016 1525   MCV 86.0 05/20/2016 1525   MCH 27.6 05/20/2016 1525   MCHC 32.1 05/20/2016 1525   RDW 18.3 (H) 05/20/2016 1525   LYMPHSABS 1.5 06/18/2015 1815   MONOABS 0.8 06/18/2015 1815   EOSABS 0.1 06/18/2015 1815   BASOSABS 0.0 06/18/2015 1815    Lab Results  Component Value Date   HGBA1C 5.9 10/05/2022    Assessment & Plan:  1. Seasonal allergic rhinitis due to other allergic trigger - loratadine (GNP LORATADINE) 10 MG tablet; Take 1 tablet (10 mg total) by mouth daily.  Dispense: 90 tablet; Refill: 1  2. Sciatica of right side Intermittent flares due to recent weight gain - meloxicam (MOBIC) 7.5 MG tablet; Take 1  tablet (7.5 mg total) by mouth daily.  Dispense: 90 tablet; Refill: 1  3. Pedal edema Amlodipine could be contributing and I have discontinued this Encouraged to comply with a low-sodium diet, elevate feet, use compression stockings - furosemide (LASIX) 20 MG tablet; Take 1 tablet (20 mg total) by mouth daily.  Dispense: 30 tablet; Refill: 3  4. Hypertension associated with diabetes Uncontrolled Increased dose of Coreg, amlodipine decreased I will see her at her next visit and adjust regimen accordingly Counseled on blood pressure goal of less than 130/80, low-sodium, DASH diet, medication compliance, 150 minutes of moderate intensity exercise per week. Discussed medication compliance, adverse effects. - carvedilol (COREG) 6.25 MG tablet; Take 1 tablet (6.25 mg total) by mouth 2 (two) times daily with a meal.  Dispense: 60 tablet; Refill: 1  5. Diabetic polyneuropathy associated with type 2 diabetes mellitus Controlled with A1c of 5.9 Due to desire for additional weight loss will substitute Trulicity with [facility] The pharmacy will proceed with prior authorization - tirzepatide [facility]) 7.5 MG/0.5ML Pen; Inject 7.5 mg into the skin once a week. For 4 weeks then increase to 10 mg once a week  Dispense: 6 mL; Refill: 0 - tirzepatide (MOUNJARO) 10 MG/0.5ML Pen; Inject 10 mg into the skin once a week.  Dispense: 6 mL; Refill: 6  Meds ordered this encounter  Medications   furosemide (LASIX) 20 MG tablet    Sig: Take 1 tablet (20 mg total) by mouth daily.    Dispense:  30 tablet    Refill:  3   loratadine (GNP LORATADINE) 10 MG tablet    Sig: Take 1 tablet (10 mg total) by mouth daily.    Dispense:  90 tablet    Refill:  1   meloxicam (MOBIC) 7.5 MG tablet    Sig: Take 1 tablet (7.5 mg total) by mouth daily.    Dispense:  90 tablet    Refill:  1   nitroGLYCERIN (NITROSTAT) 0.4 MG SL tablet    Sig: Place 1 tablet (0.4 mg total) under the tongue every 5 (five) minutes as needed for  chest pain.    Dispense:  30 tablet    Refill:  1   tirzepatide (MOUNJARO) 7.5 MG/0.5ML Pen    Sig: Inject 7.5 mg into the skin once a week. For 4 weeks then increase to 10 mg once a week    Dispense:  6 mL    Refill:  0   tirzepatide (MOUNJARO) 10 MG/0.5ML Pen    Sig: Inject 10 mg into the skin once a week.    Dispense:  6 mL    Refill:  6   carvedilol (COREG) 6.25 MG tablet    Sig: Take 1 tablet (6.25 mg total) by mouth 2 (two) times daily with a meal.    Dispense:  60 tablet    Refill:  1    Dose increase, discontinue amlodipine    Follow-up: Return in about 1 month (around 01/16/2023) for Blood Pressure follow-up.       Hoy Register, MD, FAAFP. [facility] [place], [place] [phone]   12/17/2022, 3:41 PM

## 2022-12-18 ENCOUNTER — Other Ambulatory Visit: Payer: Self-pay

## 2022-12-18 ENCOUNTER — Other Ambulatory Visit: Payer: Self-pay | Admitting: Family Medicine

## 2022-12-18 MED ORDER — VICTOZA 18 MG/3ML ~~LOC~~ SOPN
1.8000 mg | PEN_INJECTOR | Freq: Every day | SUBCUTANEOUS | 0 refills | Status: DC
  Filled 2022-12-18: qty 9, 30d supply, fill #0

## 2022-12-18 NOTE — Progress Notes (Signed)
Per pharmacy, Greggory Keen will not be approved by insurance until patient has failed both Trulicity and Victoza.  I relayed this information to the patient and she is agreeable to being placed on Victoza.  She will hold off on Trulicity and initiate Victoza and if she fails we will submit a prior authorization for [facility].

## 2022-12-21 ENCOUNTER — Other Ambulatory Visit: Payer: Self-pay

## 2022-12-22 ENCOUNTER — Other Ambulatory Visit: Payer: Self-pay

## 2022-12-23 ENCOUNTER — Ambulatory Visit: Payer: 59 | Admitting: Family Medicine

## 2023-01-27 ENCOUNTER — Other Ambulatory Visit: Payer: Self-pay | Admitting: Pharmacist

## 2023-01-27 ENCOUNTER — Telehealth: Payer: Self-pay

## 2023-01-27 ENCOUNTER — Other Ambulatory Visit: Payer: Self-pay

## 2023-01-27 DIAGNOSIS — E119 Type 2 diabetes mellitus without complications: Secondary | ICD-10-CM

## 2023-01-27 MED ORDER — ONETOUCH DELICA PLUS LANCET33G MISC
3 refills | Status: DC
Start: 2023-01-27 — End: 2023-01-27
  Filled 2023-01-27: qty 100, fill #0

## 2023-01-27 MED ORDER — ONETOUCH VERIO VI STRP
ORAL_STRIP | 2 refills | Status: DC
Start: 2023-01-27 — End: 2023-01-27
  Filled 2023-01-27: qty 100, fill #0

## 2023-01-27 MED ORDER — ACCU-CHEK GUIDE VI STRP
ORAL_STRIP | 2 refills | Status: DC
  Filled 2023-01-27: qty 100, 30d supply, fill #0

## 2023-01-27 MED ORDER — ACCU-CHEK SOFTCLIX LANCETS MISC
3 refills | Status: DC
  Filled 2023-01-27: qty 100, 30d supply, fill #0

## 2023-01-27 MED ORDER — ONETOUCH VERIO W/DEVICE KIT
PACK | 0 refills | Status: DC
Start: 2023-01-27 — End: 2023-01-27
  Filled 2023-01-27: qty 1, fill #0

## 2023-01-27 MED ORDER — ACCU-CHEK GUIDE W/DEVICE KIT
PACK | 0 refills | Status: DC
  Filled 2023-01-27: qty 1, 30d supply, fill #0

## 2023-01-27 NOTE — Progress Notes (Signed)
   Emily Dickson [birth year] [id number]  Patient outreached by Caprice Beaver , PharmD Candidate on 01/27/2023.  Blood Pressure Readings: Last documented ambulatory systolic blood pressure: 164 Last documented ambulatory diastolic blood pressure: 93 Does the patient have a validated home blood pressure machine?: Yes They report home readings 129/70 as of last night.   Medication review was performed. Is the patient taking their medications as prescribed?: No Differences from their prescribed list include:  Atorvastatin - taking in morning not at 6pm as directed Blood pressure monitor and supplies - reports taking once only a few days a week, counter to the instructed 3 times daily. Furosemide - does not take daily, will only take until "feet are better". Counter to the prescribed daily instructions. Mounjaro - Not covered by insurance, and has not received as a result. Is interested in starting due to weight gain concerns. Would like to discuss options with provider. Tizanidine - No longer taking due to negative side effects, and reports it did not help with muscle spasms.    The following barriers to adherence were noted: Does the patient have cost concerns?: Yes Does the patient have transportation concerns?: No Does the patient need assistance obtaining refills?: No Does the patient occassionally forget to take some of their prescribed medications?: No Does the patient feel like one/some of their medications make them feel poorly?: No Does the patient have questions or concerns about their medications?: No Does the patient have a follow up scheduled with their primary care provider/cardiologist?: Yes  Patient reports needing assistance with medication affordability. Specifically mentioned test strips and lancets are too expensive and are not covered by her insurance. Reached out to preceptor, Catie Clearance Coots, for potential public health services interventions.    Interventions: Interventions Completed: Medications were reviewed  The patient has follow up scheduled:  PCP: Hoy Register, MD   Caprice Beaver, Student-PharmD

## 2023-01-28 ENCOUNTER — Ambulatory Visit: Payer: 59 | Attending: Family Medicine | Admitting: Family Medicine

## 2023-01-28 ENCOUNTER — Ambulatory Visit
Admission: RE | Admit: 2023-01-28 | Discharge: 2023-01-28 | Disposition: A | Discharge status: Home / Self Care | Payer: 59 | Source: Ambulatory Visit | Attending: Family Medicine | Admitting: Family Medicine

## 2023-01-28 ENCOUNTER — Other Ambulatory Visit: Payer: Self-pay

## 2023-01-28 VITALS — BP 165/88 | HR 63 | Temp 98.0°F | Ht 67.0 in | Wt 289.6 lb

## 2023-01-28 DIAGNOSIS — N951 Menopausal and female climacteric states: Secondary | ICD-10-CM | POA: Diagnosis not present

## 2023-01-28 DIAGNOSIS — E2839 Other primary ovarian failure: Secondary | ICD-10-CM | POA: Diagnosis not present

## 2023-01-28 DIAGNOSIS — M810 Age-related osteoporosis without current pathological fracture: Secondary | ICD-10-CM | POA: Diagnosis not present

## 2023-01-28 DIAGNOSIS — R600 Localized edema: Secondary | ICD-10-CM

## 2023-01-28 DIAGNOSIS — E349 Endocrine disorder, unspecified: Secondary | ICD-10-CM | POA: Diagnosis not present

## 2023-01-28 DIAGNOSIS — Z7985 Long-term (current) use of injectable non-insulin antidiabetic drugs: Secondary | ICD-10-CM | POA: Diagnosis not present

## 2023-01-28 DIAGNOSIS — E1142 Type 2 diabetes mellitus with diabetic polyneuropathy: Secondary | ICD-10-CM | POA: Diagnosis not present

## 2023-01-28 MED ORDER — FUROSEMIDE 20 MG PO TABS
40.0000 mg | ORAL_TABLET | Freq: Every day | ORAL | 3 refills | Status: DC
Start: 2023-01-28 — End: 2023-01-28

## 2023-01-28 MED ORDER — TIRZEPATIDE 7.5 MG/0.5ML ~~LOC~~ SOAJ: 7.5000 mg | SUBCUTANEOUS | 0 refills | Status: DC

## 2023-01-28 MED ORDER — FUROSEMIDE 40 MG PO TABS
40.0000 mg | ORAL_TABLET | Freq: Every day | ORAL | 1 refills | Status: DC
Start: 2023-01-28 — End: 2023-06-01

## 2023-01-28 MED ORDER — TIRZEPATIDE 10 MG/0.5ML ~~LOC~~ SOAJ: 10.0000 mg | SUBCUTANEOUS | 6 refills | Status: DC

## 2023-01-28 NOTE — Progress Notes (Signed)
Subjective:  Patient ID: Emily Dickson, female    DOB: 1956-02-21  Age: 67 y.o. MRN: [id number]  CC: Hypertension   HPI Emily Dickson is a 67 y.o. year old female with a history of hypertension, hyperlipidemia, chronic rhinitis, right knee osteoarthritis, type 2 DM (A1c 5.9).   Interval History:  Last visit she had complained of pedal edema thought to be secondary to amlodipine as amlodipine had been recently initiated by Dr. Delford Field 2 months prior to her last visit.  Amlodipine was discontinued and Coreg dose increased.  Her BP log reveals elevated BP in the morning of >140/90 (prior to taking her antihypertensive) but in the evenings 111/61, 109/57. Blood pressure is elevated today and she endorses taking her antihypertensive. Her pedal edema has improved but is still present. She has been on Victoza due to the fact that insurance would not approve Mounjaro until she had failed Trulicity and Victoza.  She was unable to achieve adequate weight loss with Trulicity and also Victoza and has gained weight since her last visit. She noticed edema of her left arm of recent with no associated pain, no history of bug bite and no recent blood draws or IV in left arm.  She does describe she feels a funny sensation inside her left arm.  She has no dyspnea or chest pain.  Last mammogram from 04/2022 was negative for malignancy.  Past Medical History:  Diagnosis Date   Allergy    seasonal   Arthritis    Back pain    Blood transfusion without reported diagnosis    with child birth   Diabetes mellitus without complication (HCC)    High cholesterol    Hypertension     No past surgical history on file.  Family History  Problem Relation Age of Onset   Colon cancer Neg Hx    Colon polyps Neg Hx    Esophageal cancer Neg Hx    Stomach cancer Neg Hx    Rectal cancer Neg Hx     Social History   Socioeconomic History   Marital status: Single    Spouse name: Not on file   Number of  children: Not on file   Years of education: Not on file   Highest education level: Not on file  Occupational History   Not on file  Tobacco Use   Smoking status: Never   Smokeless tobacco: Never  Vaping Use   Vaping Use: Never used  Substance and Sexual Activity   Alcohol use: No   Drug use: No   Sexual activity: Yes    Partners: Male    Birth control/protection: None  Other Topics Concern   Not on file  Social History Narrative   Not on file   Social Determinants of Health   Financial Resource Strain: Low Risk  (12/10/2022)   Overall Financial Resource Strain (CARDIA)    Difficulty of Paying Living Expenses: Not very hard  Food Insecurity: No Food Insecurity (12/10/2022)   Hunger Vital Sign    Worried About Running Out of Food in the Last Year: Never true    Ran Out of Food in the Last Year: Never true  Transportation Needs: No Transportation Needs (12/10/2022)   PRAPARE - Administrator, Civil Service (Medical): No    Lack of Transportation (Non-Medical): No  Physical Activity: Sufficiently Active (12/10/2022)   Exercise Vital Sign    Days of Exercise per Week: 5 days    Minutes of Exercise  per Session: 30 min  Stress: No Stress Concern Present (12/10/2022)   [employer] of Occupational Health - Occupational Stress Questionnaire    Feeling of Stress : Only a little  Social Connections: Moderately Integrated (12/10/2022)   Social Connection and Isolation Panel [NHANES]    Frequency of Communication with Friends and Family: More than three times a week    Frequency of Social Gatherings with Friends and Family: More than three times a week    Attends Religious Services: More than 4 times per year    Active Member of [employer] or Organizations: Yes    Attends Engineer, structural: More than 4 times per year    Marital Status: Divorced    Allergies  Allergen Reactions   Penicillins Rash    Has patient had a PCN reaction causing immediate rash,  facial/tongue/throat swelling, SOB or lightheadedness with hypotension: No Has patient had a PCN reaction causing severe rash involving mucus membranes or skin necrosis: No Has patient had a PCN reaction that required hospitalization No Has patient had a PCN reaction occurring within the last 10 years: No If all of the above answers are "NO", then may proceed with Cephalosporin use.    Outpatient Medications Prior to Visit  Medication Sig Dispense Refill   Accu-Chek Softclix Lancets lancets Use to check blood sugar three times daily. E11.9 100 each 3   aspirin EC 81 MG tablet Take 1 tablet (81 mg total) by mouth daily. 90 tablet 1   atorvastatin (LIPITOR) 40 MG tablet TAKE 1 TABLET BY MOUTH ONCE DAILY AT  6PM 90 tablet 1   b complex vitamins capsule Take 1 capsule by mouth daily.     Blood Glucose Monitoring Suppl (ACCU-CHEK GUIDE) w/Device KIT Use to check blood sugar three times daily. E11.9 1 kit 0   carvedilol (COREG) 6.25 MG tablet Take 1 tablet (6.25 mg total) by mouth 2 (two) times daily with a meal. 60 tablet 1   fluticasone (FLONASE) 50 MCG/ACT nasal spray Place 2 sprays into both nostrils daily. 16 g 6   Garlic 1000 MG CAPS Take by mouth.     glucose blood (ACCU-CHEK GUIDE) test strip Use to check blood sugar three times daily. E11.9 100 each 2   liraglutide (VICTOZA) 18 MG/3ML SOPN Inject 1.8 mg into the skin daily. 9 mL 0   loratadine (GNP LORATADINE) 10 MG tablet Take 1 tablet (10 mg total) by mouth daily. 90 tablet 1   meloxicam (MOBIC) 7.5 MG tablet Take 1 tablet (7.5 mg total) by mouth daily. 90 tablet 1   Multiple Vitamin (MULTIVITAMIN WITH MINERALS) TABS tablet Take 1 tablet by mouth daily.     nitroGLYCERIN (NITROSTAT) 0.4 MG SL tablet Place 1 tablet (0.4 mg total) under the tongue every 5 (five) minutes as needed for chest pain. 30 tablet 1   potassium chloride (KLOR-CON) 10 MEQ tablet Take 1 tablet by mouth once daily 90 tablet 3   tiZANidine (ZANAFLEX) 4 MG tablet Take 1  tablet (4 mg total) by mouth every 8 (eight) hours as needed for muscle spasms. 60 tablet 1   valsartan-hydrochlorothiazide (DIOVAN-HCT) 320-25 MG tablet Take 1 tablet by mouth daily. 90 tablet 3   furosemide (LASIX) 20 MG tablet Take 1 tablet (20 mg total) by mouth daily. 30 tablet 3   Apple Cider Vinegar 500 MG TABS Take 500 mg by mouth one time only at 6 PM. (Patient not taking: Reported on 01/27/2023)     tirzepatide [facility])  10 MG/0.5ML Pen Inject 10 mg into the skin once a week. (Patient not taking: Reported on 01/27/2023) 6 mL 6   tirzepatide (MOUNJARO) 7.5 MG/0.5ML Pen Inject 7.5 mg into the skin once a week. For 4 weeks then increase to 10 mg once a week (Patient not taking: Reported on 01/27/2023) 6 mL 0   Facility-Administered Medications Prior to Visit  Medication Dose Route Frequency Provider Last Rate Last Admin   0.9 %  sodium chloride infusion  500 mL Intravenous Continuous Nandigam, Kavitha V, MD         ROS Review of Systems  Constitutional:  Negative for activity change and appetite change.  HENT:  Negative for sinus pressure and sore throat.   Respiratory:  Negative for chest tightness, shortness of breath and wheezing.   Cardiovascular:  Positive for leg swelling. Negative for chest pain and palpitations.  Gastrointestinal:  Negative for abdominal distention, abdominal pain and constipation.  Genitourinary: Negative.   Musculoskeletal: Negative.   Psychiatric/Behavioral:  Negative for behavioral problems and dysphoric mood.     Objective:  BP (!) 165/88   Pulse 63   Temp 98 F (36.7 C) (Oral)   Ht 5\' 7"  (1.702 m)   Wt 289 lb 9.6 oz (131.4 kg)   SpO2 97%   BMI 45.36 kg/m      01/28/2023    9:24 AM 01/28/2023    8:58 AM 01/26/2023   10:55 AM  BP/Weight  Systolic BP 165 161 129  Diastolic BP 88 90 70  Wt. (Lbs)  289.6   BMI  45.36 kg/m2    Wt Readings from Last 3 Encounters:  01/28/23 289 lb 9.6 oz (131.4 kg)  12/17/22 286 lb 3.2 oz (129.8 kg)  11/05/22  278 lb 6.4 oz (126.3 kg)       Physical Exam Constitutional:      Appearance: She is well-developed.  Cardiovascular:     Rate and Rhythm: Normal rate.     Heart sounds: Normal heart sounds. No murmur heard. Pulmonary:     Effort: Pulmonary effort is normal.     Breath sounds: Normal breath sounds. No wheezing or rales.  Chest:     Chest wall: No tenderness.  Abdominal:     General: Bowel sounds are normal. There is no distension.     Palpations: Abdomen is soft. There is no mass.     Tenderness: There is no abdominal tenderness.  Musculoskeletal:        General: Normal range of motion.     Right lower leg: Edema (1+ non pitting) present.     Left lower leg: Edema (1+ non pitting) present.     Comments: Slight left upper extremity nonpitting edema Right upper extremity is normal No tenderness of bilateral upper extremities or lower extremities  Neurological:     Mental Status: She is alert and oriented to person, place, and time.  Psychiatric:        Mood and Affect: Mood normal.        Latest Ref Rng & Units 10/05/2022    9:29 AM 04/01/2022    9:27 AM 02/06/2022    9:38 AM  CMP  Glucose 70 - 99 mg/dL 86  161  096   BUN 8 - 27 mg/dL 12  12  15    Creatinine 0.57 - 1.00 mg/dL 0.45  4.09  8.11   Sodium 134 - 144 mmol/L 142  142  133   Potassium 3.5 - 5.2 mmol/L 4.1  4.1  3.9   Chloride 96 - 106 mmol/L 100  98  91   CO2 20 - 29 mmol/L 25  28  27    Calcium 8.7 - 10.3 mg/dL 9.9  9.9  9.7   Total Protein 6.0 - 8.5 g/dL 7.3   7.0   Total Bilirubin 0.0 - 1.2 mg/dL 0.2   0.5   Alkaline Phos 44 - 121 IU/L 71   66   AST 0 - 40 IU/L 17   27   ALT 0 - 32 IU/L 17   26     Lipid Panel     Component Value Date/Time   CHOL 164 10/05/2022 0929   TRIG 62 10/05/2022 0929   HDL 66 10/05/2022 0929   CHOLHDL 2.7 12/11/2020 1017   CHOLHDL 2.6 03/11/2016 1043   VLDL 13 03/11/2016 1043   LDLCALC 86 10/05/2022 0929    CBC    Component Value Date/Time   WBC 4.2 05/20/2016 1525    RBC 4.42 05/20/2016 1525   HGB 12.2 05/20/2016 1525   HCT 38.0 05/20/2016 1525   PLT 181 05/20/2016 1525   MCV 86.0 05/20/2016 1525   MCH 27.6 05/20/2016 1525   MCHC 32.1 05/20/2016 1525   RDW 18.3 (H) 05/20/2016 1525   LYMPHSABS 1.5 06/18/2015 1815   MONOABS 0.8 06/18/2015 1815   EOSABS 0.1 06/18/2015 1815   BASOSABS 0.0 06/18/2015 1815    Lab Results  Component Value Date   HGBA1C 5.9 10/05/2022    Assessment & Plan:  1. Diabetic polyneuropathy associated with type 2 diabetes mellitus (HCC) Controlled with A1c of 5.9 She has failed Trulicity and Victoza and has been gaining weight on these Will proceed with ordering Mounjaro Counseled on Diabetic diet, my plate method, 161 minutes of moderate intensity exercise/week Blood sugar logs with fasting goals of 80-120 mg/dl, random of less than 096 and in the event of sugars less than 60 mg/dl or greater than 045 mg/dl encouraged to notify the clinic. Advised on the need for annual eye exams, annual foot exams, Pneumonia vaccine. - tirzepatide (MOUNJARO) 10 MG/0.5ML Pen; Inject 10 mg into the skin once a week.  Dispense: 6 mL; Refill: 6 - tirzepatide (MOUNJARO) 7.5 MG/0.5ML Pen; Inject 7.5 mg into the skin once a week. For 4 weeks then increase to 10 mg once a week  Dispense: 6 mL; Refill: 0  2. Pedal edema Increase Lasix dose Will also need to evaluate for cardiac etiology Encouraged to comply with a low-sodium diet, elevate feet, use compression stockings - furosemide (LASIX) 20 MG tablet; Take 2 tablets (40 mg total) by mouth daily.  Dispense: 30 tablet; Refill: 3 - Pro b natriuretic peptide - Basic Metabolic Panel  3. Edema of left upper arm She denies presence of insect bite or pain - VAS [place] UPPER EXTREMITY ARTERIAL DUPLEX; Future - CBC with Differential/Platelet - DG Chest 2 View; Future  4. Estrogen deficiency - DG Bone Density; Future    Meds ordered this encounter  Medications   tirzepatide (MOUNJARO) 10  MG/0.5ML Pen    Sig: Inject 10 mg into the skin once a week.    Dispense:  6 mL    Refill:  6   tirzepatide (MOUNJARO) 7.5 MG/0.5ML Pen    Sig: Inject 7.5 mg into the skin once a week. For 4 weeks then increase to 10 mg once a week    Dispense:  6 mL    Refill:  0    Failed Trulicity  and Victoza   DISCONTD: furosemide (LASIX) 20 MG tablet    Sig: Take 2 tablets (40 mg total) by mouth daily.    Dispense:  30 tablet    Refill:  3    Dose increase   furosemide (LASIX) 40 MG tablet    Sig: Take 1 tablet (40 mg total) by mouth daily.    Dispense:  90 tablet    Refill:  1    Dose increase    Follow-up: Return in about 1 month (around 02/28/2023) for Blood Pressure follow-up.       Hoy Register, MD, FAAFP. [facility] [employer], [place] [phone]   01/28/2023, 11:44 AM

## 2023-01-28 NOTE — Patient Instructions (Signed)
Edema ? ?Edema is when you have too much fluid in your body or under your skin. Edema may make your legs, feet, and ankles swell. Swelling often happens in looser tissues, such as around your eyes. This is a common condition. It gets more common as you get older. ?There are many possible causes of edema. These include: ?Eating too much salt (sodium). ?Being on your feet or sitting for a long time. ?Certain medical conditions, such as: ?Pregnancy. ?Heart failure. ?Liver disease. ?Kidney disease. ?Cancer. ?Hot weather may make edema worse. Edema is usually painless. Your skin may look swollen or shiny. ?Follow these instructions at home: ?Medicines ?Take over-the-counter and prescription medicines only as told by your doctor. ?Your doctor may prescribe a medicine to help your body get rid of extra water (diuretic). Take this medicine if you are told to take it. ?Eating and drinking ?Eat a low-salt (low-sodium) diet as told by your doctor. Sometimes, eating less salt may reduce swelling. ?Depending on the cause of your swelling, you may need to limit how much fluid you drink (fluid restriction). ?General instructions ?Raise the injured area above the level of your heart while you are sitting or lying down. ?Do not sit still or stand for a long time. ?Do not wear tight clothes. Do not wear garters on your upper legs. ?Exercise your legs. This can help the swelling go down. ?Wear compression stockings as told by your doctor. It is important that these are the right size. These should be prescribed by your doctor to prevent possible injuries. ?If elastic bandages or wraps are recommended, use them as told by your doctor. ?Contact a doctor if: ?Treatment is not working. ?You have heart, liver, or kidney disease and have symptoms of edema. ?You have sudden and unexplained weight gain. ?Get help right away if: ?You have shortness of breath or chest pain. ?You cannot breathe when you lie down. ?You have pain, redness, or  warmth in the swollen areas. ?You have heart, liver, or kidney disease and get edema all of a sudden. ?You have a fever and your symptoms get worse all of a sudden. ?These symptoms may be an emergency. Get help right away. Call 911. ?Do not wait to see if the symptoms will go away. ?Do not drive yourself to the hospital. ?Summary ?Edema is when you have too much fluid in your body or under your skin. ?Edema may make your legs, feet, and ankles swell. Swelling often happens in looser tissues, such as around your eyes. ?Raise the injured area above the level of your heart while you are sitting or lying down. ?Follow your doctor's instructions about diet and how much fluid you can drink. ?This information is not intended to replace advice given to you by your health care provider. Make sure you discuss any questions you have with your health care provider. ?Document Revised: 04/28/2021 Document Reviewed: 04/28/2021 ?Elsevier Patient Education ? 2023 [employer] ? ?

## 2023-01-28 NOTE — Progress Notes (Signed)
Discuss weight gain Swelling in left arm

## 2023-01-29 LAB — BASIC METABOLIC PANEL
BUN/Creatinine Ratio: 22 (ref 12–28)
BUN: 14 mg/dL (ref 8–27)
CO2: 29 mmol/L (ref 20–29)
Calcium: 9.5 mg/dL (ref 8.7–10.3)
Chloride: 101 mmol/L (ref 96–106)
Creatinine, Ser: 0.65 mg/dL (ref 0.57–1.00)
Glucose: 93 mg/dL (ref 70–99)
Potassium: 3.9 mmol/L (ref 3.5–5.2)
Sodium: 141 mmol/L (ref 134–144)
eGFR: 98 mL/min/{1.73_m2} (ref >59)

## 2023-01-29 LAB — CBC WITH DIFFERENTIAL/PLATELET
Basophils Absolute: 0 10*3/uL (ref 0.0–0.2)
Basos: 1 %
EOS (ABSOLUTE): 0.2 10*3/uL (ref 0.0–0.4)
Eos: 4 %
Hematocrit: 35.9 % (ref 34.0–46.6)
Hemoglobin: 11 g/dL — ABNORMAL LOW (ref 11.1–15.9)
Immature Grans (Abs): 0 10*3/uL (ref 0.0–0.1)
Immature Granulocytes: 0 %
Lymphocytes Absolute: 1.9 10*3/uL (ref 0.7–3.1)
Lymphs: 39 %
MCH: 25.6 pg — ABNORMAL LOW (ref 26.6–33.0)
MCHC: 30.6 g/dL — ABNORMAL LOW (ref 31.5–35.7)
MCV: 84 fL (ref 79–97)
Monocytes Absolute: 0.4 10*3/uL (ref 0.1–0.9)
Monocytes: 8 %
Neutrophils Absolute: 2.3 10*3/uL (ref 1.4–7.0)
Neutrophils: 48 %
Platelets: 208 10*3/uL (ref 150–450)
RBC: 4.3 x10E6/uL (ref 3.77–5.28)
RDW: 14.7 % (ref 11.7–15.4)
WBC: 4.8 10*3/uL (ref 3.4–10.8)

## 2023-01-29 LAB — PRO B NATRIURETIC PEPTIDE: NT-Pro BNP: 51 pg/mL (ref 0–301)

## 2023-02-02 ENCOUNTER — Telehealth: Payer: Self-pay | Admitting: Family Medicine

## 2023-02-02 NOTE — Telephone Encounter (Signed)
FYI

## 2023-02-02 NOTE — Telephone Encounter (Signed)
Patient stated she went to get her Greggory Keen and her insurance will no longer cover and insurance advised her to speak with provider and have her call insurance for another medication that works and is covered

## 2023-02-03 ENCOUNTER — Other Ambulatory Visit: Payer: Self-pay

## 2023-02-04 ENCOUNTER — Other Ambulatory Visit: Payer: Self-pay

## 2023-02-08 ENCOUNTER — Encounter (HOSPITAL_COMMUNITY): Payer: Self-pay

## 2023-02-08 ENCOUNTER — Ambulatory Visit (HOSPITAL_COMMUNITY)
Admission: EM | Admit: 2023-02-08 | Discharge: 2023-02-08 | Disposition: A | Discharge status: Home / Self Care | Payer: 59 | Attending: Internal Medicine | Admitting: Internal Medicine

## 2023-02-08 DIAGNOSIS — S134XXA Sprain of ligaments of cervical spine, initial encounter: Secondary | ICD-10-CM

## 2023-02-08 DIAGNOSIS — R519 Headache, unspecified: Secondary | ICD-10-CM

## 2023-02-08 MED ORDER — METHOCARBAMOL 500 MG PO TABS: 500.0000 mg | ORAL_TABLET | Freq: Every evening | ORAL | 0 refills | Status: DC | PRN

## 2023-02-08 MED ORDER — IBUPROFEN 600 MG PO TABS: 600.0000 mg | ORAL_TABLET | Freq: Four times a day (QID) | ORAL | 0 refills | Status: DC | PRN
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
## 2023-02-08 NOTE — Discharge Instructions (Addendum)
Please take medications as prescribed Please do not drive or operate heavy machinery after taking muscle relaxants because muscle relaxant makes you drowsy Heating pad use only 20 minutes on-20 minutes off cycle As the pain improves please do gentle range of motion exercises If you have worsening headaches, blurry vision, nausea or vomiting please return to urgent care to be reevaluated.

## 2023-02-10 ENCOUNTER — Other Ambulatory Visit: Payer: Self-pay

## 2023-02-12 ENCOUNTER — Other Ambulatory Visit: Payer: Self-pay

## 2023-02-15 ENCOUNTER — Ambulatory Visit (HOSPITAL_COMMUNITY)
Admission: RE | Admit: 2023-02-15 | Discharge: 2023-02-15 | Disposition: A | Discharge status: Home / Self Care | Payer: Medicare Other | Source: Ambulatory Visit | Attending: Family Medicine | Admitting: Family Medicine

## 2023-02-15 ENCOUNTER — Other Ambulatory Visit: Payer: Self-pay | Admitting: Family Medicine

## 2023-02-15 DIAGNOSIS — R600 Localized edema: Secondary | ICD-10-CM | POA: Diagnosis not present

## 2023-02-15 DIAGNOSIS — E2839 Other primary ovarian failure: Secondary | ICD-10-CM

## 2023-02-15 DIAGNOSIS — E1142 Type 2 diabetes mellitus with diabetic polyneuropathy: Secondary | ICD-10-CM

## 2023-02-16 ENCOUNTER — Ambulatory Visit: Payer: Medicare Other

## 2023-02-16 ENCOUNTER — Ambulatory Visit: Payer: Medicare Other | Attending: Family Medicine | Admitting: Family Medicine

## 2023-02-16 ENCOUNTER — Other Ambulatory Visit: Payer: Self-pay

## 2023-02-16 VITALS — BP 124/76 | HR 66 | Ht 67.0 in

## 2023-02-16 DIAGNOSIS — Z7985 Long-term (current) use of injectable non-insulin antidiabetic drugs: Secondary | ICD-10-CM

## 2023-02-16 DIAGNOSIS — R600 Localized edema: Secondary | ICD-10-CM

## 2023-02-16 DIAGNOSIS — E1159 Type 2 diabetes mellitus with other circulatory complications: Secondary | ICD-10-CM

## 2023-02-16 DIAGNOSIS — I152 Hypertension secondary to endocrine disorders: Secondary | ICD-10-CM

## 2023-02-16 DIAGNOSIS — R2232 Localized swelling, mass and lump, left upper limb: Secondary | ICD-10-CM | POA: Diagnosis not present

## 2023-02-16 NOTE — Progress Notes (Signed)
Subjective:  Patient ID: Emily Dickson, female    DOB: [birth year]  Age: 67 y.o. MRN: [id number]  CC: Hypertension   HPI Emily Dickson is a 67 y.o. year old female with a history of hypertension, hyperlipidemia, chronic rhinitis, right knee osteoarthritis, type 2 DM (A1c 5.9).   Interval History:  She presents today for BP follow up and her pressure is controlled.  Lasix had been increased at her last visit due to ongoing pedal edema which she states is still slightly present. She had also complained of left upper extremity swelling and Doppler ordered.  Upper extremity Doppler revealed: Summary:    Right:  No evidence of superficial vein thrombosis in the upper extremity.    Left:  No evidence of deep vein thrombosis in the upper extremity.  Incidental findings - A cystic structure in the left axillary region  measuring  approximately 4.5 x 3.07 X 2.43. Further testing may be warranted.   Past Medical History:  Diagnosis Date   Allergy    seasonal   Arthritis    Back pain    Blood transfusion without reported diagnosis    with child birth   Diabetes mellitus without complication (HCC)    High cholesterol    Hypertension     No past surgical history on file.  Family History  Problem Relation Age of Onset   Colon cancer Neg Hx    Colon polyps Neg Hx    Esophageal cancer Neg Hx    Stomach cancer Neg Hx    Rectal cancer Neg Hx     Social History   Socioeconomic History   Marital status: Single    Spouse name: Not on file   Number of children: Not on file   Years of education: Not on file   Highest education level: Not on file  Occupational History   Not on file  Tobacco Use   Smoking status: Never   Smokeless tobacco: Never  Vaping Use   Vaping Use: Never used  Substance and Sexual Activity   Alcohol use: No   Drug use: No   Sexual activity: Yes    Partners: Male    Birth control/protection: None  Other Topics Concern   Not on file   Social History Narrative   Not on file   Social Determinants of Health   Financial Resource Strain: Low Risk  (12/10/2022)   Overall Financial Resource Strain (CARDIA)    Difficulty of Paying Living Expenses: Not very hard  Food Insecurity: No Food Insecurity (12/10/2022)   Hunger Vital Sign    Worried About Running Out of Food in the Last Year: Never true    Ran Out of Food in the Last Year: Never true  Transportation Needs: No Transportation Needs (12/10/2022)   PRAPARE - Administrator, Civil Service (Medical): No    Lack of Transportation (Non-Medical): No  Physical Activity: Sufficiently Active (12/10/2022)   Exercise Vital Sign    Days of Exercise per Week: 5 days    Minutes of Exercise per Session: 30 min  Stress: No Stress Concern Present (12/10/2022)   [employer] of Occupational Health - Occupational Stress Questionnaire    Feeling of Stress : Only a little  Social Connections: Moderately Integrated (12/10/2022)   Social Connection and Isolation Panel [NHANES]    Frequency of Communication with Friends and Family: More than three times a week    Frequency of Social Gatherings with Friends and Family: More than  three times a week    Attends Religious Services: More than 4 times per year    Active Member of Clubs or Organizations: Yes    Attends Engineer, structural: More than 4 times per year    Marital Status: Divorced    Allergies  Allergen Reactions   Penicillins Rash    Has patient had a PCN reaction causing immediate rash, facial/tongue/throat swelling, SOB or lightheadedness with hypotension: No Has patient had a PCN reaction causing severe rash involving mucus membranes or skin necrosis: No Has patient had a PCN reaction that required hospitalization No Has patient had a PCN reaction occurring within the last 10 years: No If all of the above answers are "NO", then may proceed with Cephalosporin use.    Outpatient Medications Prior to Visit   Medication Sig Dispense Refill   Accu-Chek Softclix Lancets lancets Use to check blood sugar three times daily. E11.9 100 each 3   Apple Cider Vinegar 500 MG TABS Take 500 mg by mouth one time only at 6 PM.     aspirin EC 81 MG tablet Take 1 tablet (81 mg total) by mouth daily. 90 tablet 1   atorvastatin (LIPITOR) 40 MG tablet TAKE 1 TABLET BY MOUTH ONCE DAILY AT  6PM 90 tablet 1   b complex vitamins capsule Take 1 capsule by mouth daily.     Blood Glucose Monitoring Suppl (ACCU-CHEK GUIDE) w/Device KIT Use to check blood sugar three times daily. E11.9 1 kit 0   carvedilol (COREG) 6.25 MG tablet Take 1 tablet (6.25 mg total) by mouth 2 (two) times daily with a meal. 60 tablet 1   fluticasone (FLONASE) 50 MCG/ACT nasal spray Place 2 sprays into both nostrils daily. 16 g 6   furosemide (LASIX) 40 MG tablet Take 1 tablet (40 mg total) by mouth daily. 90 tablet 1   Garlic 1000 MG CAPS Take by mouth.     glucose blood (ACCU-CHEK GUIDE) test strip Use to check blood sugar three times daily. E11.9 100 each 2   ibuprofen (ADVIL) 600 MG tablet Take 1 tablet (600 mg total) by mouth every 6 (six) hours as needed. 30 tablet 0   liraglutide (VICTOZA) 18 MG/3ML SOPN Inject 1.8 mg into the skin daily. 9 mL 0   loratadine (GNP LORATADINE) 10 MG tablet Take 1 tablet (10 mg total) by mouth daily. 90 tablet 1   meloxicam (MOBIC) 7.5 MG tablet Take 1 tablet (7.5 mg total) by mouth daily. 90 tablet 1   methocarbamol (ROBAXIN) 500 MG tablet Take 1 tablet (500 mg total) by mouth at bedtime as needed for muscle spasms. 10 tablet 0   Multiple Vitamin (MULTIVITAMIN WITH MINERALS) TABS tablet Take 1 tablet by mouth daily.     nitroGLYCERIN (NITROSTAT) 0.4 MG SL tablet Place 1 tablet (0.4 mg total) under the tongue every 5 (five) minutes as needed for chest pain. 30 tablet 1   potassium chloride (KLOR-CON) 10 MEQ tablet Take 1 tablet by mouth once daily 90 tablet 3   valsartan-hydrochlorothiazide (DIOVAN-HCT) 320-25 MG  tablet Take 1 tablet by mouth daily. 90 tablet 3   tirzepatide (MOUNJARO) 10 MG/0.5ML Pen Inject 10 mg into the skin once a week. (Patient not taking: Reported on 02/16/2023) 6 mL 6   tirzepatide (MOUNJARO) 7.5 MG/0.5ML Pen Inject 7.5 mg into the skin once a week. For 4 weeks then increase to 10 mg once a week (Patient not taking: Reported on 02/16/2023) 6 mL 0  Facility-Administered Medications Prior to Visit  Medication Dose Route Frequency Provider Last Rate Last Admin   0.9 %  sodium chloride infusion  500 mL Intravenous Continuous Nandigam, Kavitha V, MD         ROS Review of Systems  Constitutional:  Negative for activity change and appetite change.  HENT:  Negative for sinus pressure and sore throat.   Respiratory:  Negative for chest tightness, shortness of breath and wheezing.   Cardiovascular:  Negative for chest pain and palpitations.  Gastrointestinal:  Negative for abdominal distention, abdominal pain and constipation.  Genitourinary: Negative.   Musculoskeletal: Negative.   Psychiatric/Behavioral:  Negative for behavioral problems and dysphoric mood.     Objective:  BP 124/76   Pulse 66   Ht 5\' 7"  (1.702 m)   SpO2 98%   BMI 45.36 kg/m      02/16/2023   11:27 AM 02/08/2023    1:27 PM 01/28/2023    9:24 AM  BP/Weight  Systolic BP 124 143 165  Diastolic BP 76 81 88      Physical Exam Constitutional:      Appearance: She is well-developed.  Cardiovascular:     Rate and Rhythm: Normal rate.     Heart sounds: Normal heart sounds. No murmur heard. Pulmonary:     Effort: Pulmonary effort is normal.     Breath sounds: Normal breath sounds. No wheezing or rales.  Chest:     Chest wall: No tenderness.  Abdominal:     General: Bowel sounds are normal. There is no distension.     Palpations: Abdomen is soft. There is no mass.     Tenderness: There is no abdominal tenderness.  Musculoskeletal:        General: Swelling (LUE) present. Normal range of motion.      Right lower leg: Edema (1+ non pitting) present.     Left lower leg: Edema (1+ non poitting) present.  Lymphadenopathy:     Cervical: No cervical adenopathy.     Upper Body:     Right upper body: No axillary adenopathy.     Left upper body: No axillary adenopathy.  Neurological:     Mental Status: She is alert and oriented to person, place, and time.  Psychiatric:        Mood and Affect: Mood normal.        Latest Ref Rng & Units 01/28/2023   10:08 AM 10/05/2022    9:29 AM 04/01/2022    9:27 AM  CMP  Glucose 70 - 99 mg/dL 93  86  161   BUN 8 - 27 mg/dL 14  12  12    Creatinine 0.57 - 1.00 mg/dL 0.96  0.45  4.09   Sodium 134 - 144 mmol/L 141  142  142   Potassium 3.5 - 5.2 mmol/L 3.9  4.1  4.1   Chloride 96 - 106 mmol/L 101  100  98   CO2 20 - 29 mmol/L 29  25  28    Calcium 8.7 - 10.3 mg/dL 9.5  9.9  9.9   Total Protein 6.0 - 8.5 g/dL  7.3    Total Bilirubin 0.0 - 1.2 mg/dL  0.2    Alkaline Phos 44 - 121 IU/L  71    AST 0 - 40 IU/L  17    ALT 0 - 32 IU/L  17      Lipid Panel     Component Value Date/Time   CHOL 164 10/05/2022 0929  TRIG 62 10/05/2022 0929   HDL 66 10/05/2022 0929   CHOLHDL 2.7 12/11/2020 1017   CHOLHDL 2.6 03/11/2016 1043   VLDL 13 03/11/2016 1043   LDLCALC 86 10/05/2022 0929    CBC    Component Value Date/Time   WBC 4.8 01/28/2023 1008   WBC 4.2 05/20/2016 1525   RBC 4.30 01/28/2023 1008   RBC 4.42 05/20/2016 1525   HGB 11.0 (L) 01/28/2023 1008   HCT 35.9 01/28/2023 1008   PLT 208 01/28/2023 1008   MCV 84 01/28/2023 1008   MCH 25.6 (L) 01/28/2023 1008   MCH 27.6 05/20/2016 1525   MCHC 30.6 (L) 01/28/2023 1008   MCHC 32.1 05/20/2016 1525   RDW 14.7 01/28/2023 1008   LYMPHSABS 1.9 01/28/2023 1008   MONOABS 0.8 06/18/2015 1815   EOSABS 0.2 01/28/2023 1008   BASOSABS 0.0 01/28/2023 1008    Lab Results  Component Value Date   HGBA1C 5.9 10/05/2022    Assessment & Plan:  1. Mass of left axilla Cystic mass noted on ultrasound - [place] LT  UPPER EXTREM LTD SOFT TISSUE NON VASCULAR; Future  2. Pedal edema Currently on Lasix 40 mg Will check potassium Encouraged to comply with a low-sodium diet, elevate feet, use compression stockings - Potassium  3. Hypertension associated with diabetes (HCC) Controlled Continue antihypertensive regimen Counseled on blood pressure goal of less than 130/80, low-sodium, DASH diet, medication compliance, 150 minutes of moderate intensity exercise per week. Discussed medication compliance, adverse effects.    No orders of the defined types were placed in this encounter.   Follow-up: Return in about 3 months (around 05/19/2023) for Medical conditions with PCP; cancel existing appointment.       Emily Register, MD, FAAFP. [facility] [place], [place] [phone]   02/16/2023, 12:53 PM

## 2023-02-16 NOTE — Patient Instructions (Signed)
Edema  Edema is when you have too much fluid in your body or under your skin. Edema may make your legs, feet, and ankles swell. Swelling often happens in looser tissues, such as around your eyes. This is a common condition. It gets more common as you get older. There are many possible causes of edema. These include: Eating too much salt (sodium). Being on your feet or sitting for a long time. Certain medical conditions, such as: Pregnancy. Heart failure. Liver disease. Kidney disease. Cancer. Hot weather may make edema worse. Edema is usually painless. Your skin may look swollen or shiny. Follow these instructions at home: Medicines Take over-the-counter and prescription medicines only as told by your doctor. Your doctor may prescribe a medicine to help your body get rid of extra water (diuretic). Take this medicine if you are told to take it. Eating and drinking Eat a low-salt (low-sodium) diet as told by your doctor. Sometimes, eating less salt may reduce swelling. Depending on the cause of your swelling, you may need to limit how much fluid you drink (fluid restriction). General instructions Raise the injured area above the level of your heart while you are sitting or lying down. Do not sit still or stand for a long time. Do not wear tight clothes. Do not wear garters on your upper legs. Exercise your legs. This can help the swelling go down. Wear compression stockings as told by your doctor. It is important that these are the right size. These should be prescribed by your doctor to prevent possible injuries. If elastic bandages or wraps are recommended, use them as told by your doctor. Contact a doctor if: Treatment is not working. You have heart, liver, or kidney disease and have symptoms of edema. You have sudden and unexplained weight gain. Get help right away if: You have shortness of breath or chest pain. You cannot breathe when you lie down. You have pain, redness, or  warmth in the swollen areas. You have heart, liver, or kidney disease and get edema all of a sudden. You have a fever and your symptoms get worse all of a sudden. These symptoms may be an emergency. Get help right away. Call 911. Do not wait to see if the symptoms will go away. Do not drive yourself to the hospital. Summary Edema is when you have too much fluid in your body or under your skin. Edema may make your legs, feet, and ankles swell. Swelling often happens in looser tissues, such as around your eyes. Raise the injured area above the level of your heart while you are sitting or lying down. Follow your doctor's instructions about diet and how much fluid you can drink. This information is not intended to replace advice given to you by your health care provider. Make sure you discuss any questions you have with your health care provider. Document Revised: 04/28/2021 Document Reviewed: 04/28/2021 Elsevier Patient Education  2024 [employer] Inc.  

## 2023-02-17 LAB — POTASSIUM: Potassium: 4 mmol/L (ref 3.5–5.2)

## 2023-03-06 ENCOUNTER — Other Ambulatory Visit: Payer: Self-pay | Admitting: Family Medicine

## 2023-03-06 DIAGNOSIS — E876 Hypokalemia: Secondary | ICD-10-CM

## 2023-03-09 ENCOUNTER — Inpatient Hospital Stay: Admission: RE | Admit: 2023-03-09 | Payer: Medicare Other | Source: Ambulatory Visit

## 2023-03-09 ENCOUNTER — Telehealth: Payer: Self-pay

## 2023-03-09 ENCOUNTER — Other Ambulatory Visit: Payer: Self-pay | Admitting: Family Medicine

## 2023-03-09 DIAGNOSIS — R2232 Localized swelling, mass and lump, left upper limb: Secondary | ICD-10-CM

## 2023-03-09 NOTE — Telephone Encounter (Signed)
Pt scheduled at breast center for both exams

## 2023-03-09 NOTE — Telephone Encounter (Signed)
Copied from CRM [id number]. Topic: Referral - Question >> Mar 09, 2023  1:37 PM Carrielelia G wrote: Diagnosis: R22.32 (ICD-10-CM) - Mass of left axilla Procedures: [id number] - US SOFT TISSUE UPPER EXTREMITY LIMITED LEFT (NON-VASCULAR) Per Joyce Gross w/ [facility] this should go to [facility]

## 2023-03-17 ENCOUNTER — Other Ambulatory Visit: Payer: Self-pay | Admitting: Family Medicine

## 2023-03-17 ENCOUNTER — Ambulatory Visit
Admission: RE | Admit: 2023-03-17 | Discharge: 2023-03-17 | Disposition: A | Discharge status: Home / Self Care | Payer: Medicare Other | Source: Ambulatory Visit | Attending: Family Medicine | Admitting: Family Medicine

## 2023-03-17 DIAGNOSIS — Z0389 Encounter for observation for other suspected diseases and conditions ruled out: Secondary | ICD-10-CM | POA: Diagnosis not present

## 2023-03-17 DIAGNOSIS — R2232 Localized swelling, mass and lump, left upper limb: Secondary | ICD-10-CM

## 2023-03-17 DIAGNOSIS — E785 Hyperlipidemia, unspecified: Secondary | ICD-10-CM

## 2023-03-17 NOTE — Telephone Encounter (Signed)
Requested Prescriptions  Pending Prescriptions Disp Refills   atorvastatin (LIPITOR) 40 MG tablet [Pharmacy Med Name: Atorvastatin Calcium 40 MG Oral Tablet] 90 tablet 0    Sig: TAKE 1 TABLET BY MOUTH ONCE DAILY AT  6  PM     Cardiovascular:  Antilipid - Statins Failed - 03/17/2023  9:04 AM      Failed - Lipid Panel in normal range within the last 12 months    Cholesterol, Total  Date Value Ref Range Status  10/05/2022 164 100 - 199 mg/dL Final   LDL Chol Calc (NIH)  Date Value Ref Range Status  10/05/2022 86 0 - 99 mg/dL Final   HDL  Date Value Ref Range Status  10/05/2022 66 >39 mg/dL Final   Triglycerides  Date Value Ref Range Status  10/05/2022 62 0 - 149 mg/dL Final         Passed - Patient is not pregnant      Passed - Valid encounter within last 12 months    Recent Outpatient Visits           4 weeks ago Mass of left axilla   [place] [facility] [place], Odette Horns, MD   1 month ago Edema of left upper arm   [place] [facility] [place], Odette Horns, MD   3 months ago Pedal edema   [place] [facility] Hoy Register, MD   3 months ago Essential hypertension   [facility] [place], [place] L, RPH-CPP   4 months ago Hypertensive urgency    [facility] Storm Frisk, MD       Future Appointments             In 2 months Hoy Register, MD [facility]

## 2023-03-19 ENCOUNTER — Other Ambulatory Visit: Payer: Self-pay | Admitting: Family Medicine

## 2023-03-19 DIAGNOSIS — R2230 Localized swelling, mass and lump, unspecified upper limb: Secondary | ICD-10-CM

## 2023-04-02 ENCOUNTER — Other Ambulatory Visit: Payer: Self-pay | Admitting: Family Medicine

## 2023-04-02 ENCOUNTER — Ambulatory Visit
Admission: RE | Admit: 2023-04-02 | Discharge: 2023-04-02 | Disposition: A | Discharge status: Home / Self Care | Payer: Medicare Other | Source: Ambulatory Visit | Attending: Family Medicine | Admitting: Family Medicine

## 2023-04-02 DIAGNOSIS — R600 Localized edema: Secondary | ICD-10-CM

## 2023-04-02 DIAGNOSIS — R2230 Localized swelling, mass and lump, unspecified upper limb: Secondary | ICD-10-CM

## 2023-04-02 NOTE — Telephone Encounter (Signed)
Unable to refill per protocol, Rx expired. Discontinued 01/28/23, dose change.  Requested Prescriptions  Pending Prescriptions Disp Refills   furosemide (LASIX) 20 MG tablet [Pharmacy Med Name: Furosemide 20 MG Oral Tablet] 60 tablet 0    Sig: Take 2 tablets by mouth once daily     Cardiovascular:  Diuretics - Loop Failed - 04/02/2023  6:20 AM      Failed - Mg Level in normal range and within 180 days    Magnesium  Date Value Ref Range Status  06/10/2015 1.6 (L) 1.7 - 2.4 mg/dL Final         Passed - K in normal range and within 180 days    Potassium  Date Value Ref Range Status  02/16/2023 4.0 3.5 - 5.2 mmol/L Final         Passed - Ca in normal range and within 180 days    Calcium  Date Value Ref Range Status  01/28/2023 9.5 8.7 - 10.3 mg/dL Final   Calcium, Ion  Date Value Ref Range Status  07/29/2012 1.21 1.12 - 1.23 mmol/L Final         Passed - Na in normal range and within 180 days    Sodium  Date Value Ref Range Status  01/28/2023 141 134 - 144 mmol/L Final         Passed - Cr in normal range and within 180 days    Creat  Date Value Ref Range Status  09/21/2016 0.54 0.50 - 1.05 mg/dL Final    Comment:      For patients > or = 65 years of age: The upper reference limit for Creatinine is approximately 13% higher for people identified as African-American.      Creatinine, Ser  Date Value Ref Range Status  01/28/2023 0.65 0.57 - 1.00 mg/dL Final         Passed - Cl in normal range and within 180 days    Chloride  Date Value Ref Range Status  01/28/2023 101 96 - 106 mmol/L Final         Passed - Last BP in normal range    BP Readings from Last 1 Encounters:  02/16/23 124/76         Passed - Valid encounter within last 6 months    Recent Outpatient Visits           1 month ago Mass of left axilla   [place] [facility] [place], Odette Horns, MD   2 months ago Edema of left upper arm   [place] [facility] Hoy Register, MD   3 months ago Pedal edema   [place] [facility] Hoy Register, MD   3 months ago Essential hypertension   [facility] [place], [place] L, RPH-CPP   4 months ago Hypertensive urgency   [facility] Storm Frisk, MD       Future Appointments             In 2 months Hoy Register, MD [facility]

## 2023-04-12 ENCOUNTER — Other Ambulatory Visit: Payer: Self-pay | Admitting: Family Medicine

## 2023-04-12 DIAGNOSIS — I152 Hypertension secondary to endocrine disorders: Secondary | ICD-10-CM

## 2023-05-05 ENCOUNTER — Other Ambulatory Visit: Payer: Self-pay | Admitting: Family Medicine

## 2023-05-05 DIAGNOSIS — E1142 Type 2 diabetes mellitus with diabetic polyneuropathy: Secondary | ICD-10-CM

## 2023-06-01 ENCOUNTER — Other Ambulatory Visit: Payer: Self-pay | Admitting: Family Medicine

## 2023-06-01 ENCOUNTER — Ambulatory Visit: Payer: Medicare Other | Attending: Family Medicine | Admitting: Family Medicine

## 2023-06-01 ENCOUNTER — Other Ambulatory Visit: Payer: Self-pay

## 2023-06-01 ENCOUNTER — Encounter: Payer: Self-pay | Admitting: Family Medicine

## 2023-06-01 VITALS — BP 147/82 | HR 66 | Ht 67.0 in | Wt 285.2 lb

## 2023-06-01 DIAGNOSIS — E1159 Type 2 diabetes mellitus with other circulatory complications: Secondary | ICD-10-CM | POA: Diagnosis not present

## 2023-06-01 DIAGNOSIS — Z23 Encounter for immunization: Secondary | ICD-10-CM

## 2023-06-01 DIAGNOSIS — Z7985 Long-term (current) use of injectable non-insulin antidiabetic drugs: Secondary | ICD-10-CM | POA: Diagnosis not present

## 2023-06-01 DIAGNOSIS — G8929 Other chronic pain: Secondary | ICD-10-CM

## 2023-06-01 DIAGNOSIS — E876 Hypokalemia: Secondary | ICD-10-CM | POA: Diagnosis not present

## 2023-06-01 DIAGNOSIS — R2230 Localized swelling, mass and lump, unspecified upper limb: Secondary | ICD-10-CM | POA: Diagnosis not present

## 2023-06-01 DIAGNOSIS — I152 Hypertension secondary to endocrine disorders: Secondary | ICD-10-CM | POA: Diagnosis not present

## 2023-06-01 DIAGNOSIS — E119 Type 2 diabetes mellitus without complications: Secondary | ICD-10-CM | POA: Diagnosis not present

## 2023-06-01 DIAGNOSIS — E1169 Type 2 diabetes mellitus with other specified complication: Secondary | ICD-10-CM | POA: Diagnosis not present

## 2023-06-01 DIAGNOSIS — R600 Localized edema: Secondary | ICD-10-CM

## 2023-06-01 DIAGNOSIS — M545 Low back pain, unspecified: Secondary | ICD-10-CM | POA: Diagnosis not present

## 2023-06-01 DIAGNOSIS — E785 Hyperlipidemia, unspecified: Secondary | ICD-10-CM

## 2023-06-01 LAB — POCT GLYCOSYLATED HEMOGLOBIN (HGB A1C): HbA1c, POC (controlled diabetic range): 6.3 % (ref 0.0–7.0)

## 2023-06-01 MED ORDER — VALSARTAN-HYDROCHLOROTHIAZIDE 320-25 MG PO TABS
1.0000 | ORAL_TABLET | Freq: Every day | ORAL | 1 refills | Status: DC
  Filled 2023-06-01: qty 90, 90d supply, fill #0

## 2023-06-01 MED ORDER — ATORVASTATIN CALCIUM 40 MG PO TABS
ORAL_TABLET | ORAL | 1 refills | Status: DC
Start: 2023-06-01 — End: 2023-06-01
  Filled 2023-06-01: qty 90, 90d supply, fill #0

## 2023-06-01 MED ORDER — VALSARTAN-HYDROCHLOROTHIAZIDE 320-25 MG PO TABS: 1.0000 | ORAL_TABLET | Freq: Every day | ORAL | 1 refills | Status: DC

## 2023-06-01 MED ORDER — FUROSEMIDE 40 MG PO TABS
40.0000 mg | ORAL_TABLET | Freq: Every day | ORAL | 1 refills | Status: DC
Start: 2023-06-01 — End: 2023-06-01
  Filled 2023-06-01: qty 90, 90d supply, fill #0

## 2023-06-01 MED ORDER — MELOXICAM 7.5 MG PO TABS
7.5000 mg | ORAL_TABLET | Freq: Every day | ORAL | 1 refills | Status: DC
Start: 2023-06-01 — End: 2023-12-20

## 2023-06-01 MED ORDER — TIRZEPATIDE 15 MG/0.5ML ~~LOC~~ SOAJ
15.0000 mg | SUBCUTANEOUS | 6 refills | Status: DC
  Filled 2023-06-01: qty 6, 84d supply, fill #0

## 2023-06-01 MED ORDER — POTASSIUM CHLORIDE ER 10 MEQ PO TBCR
10.0000 meq | EXTENDED_RELEASE_TABLET | Freq: Every day | ORAL | 1 refills | Status: DC
Start: 2023-06-01 — End: 2023-09-20

## 2023-06-01 MED ORDER — CARVEDILOL 6.25 MG PO TABS
6.2500 mg | ORAL_TABLET | Freq: Two times a day (BID) | ORAL | 1 refills | Status: DC
Start: 2023-06-01 — End: 2023-09-20

## 2023-06-01 MED ORDER — TIRZEPATIDE 15 MG/0.5ML ~~LOC~~ SOAJ: 15.0000 mg | SUBCUTANEOUS | 6 refills | Status: DC

## 2023-06-01 MED ORDER — FUROSEMIDE 40 MG PO TABS
40.0000 mg | ORAL_TABLET | Freq: Every day | ORAL | 1 refills | Status: DC
Start: 2023-06-01 — End: 2023-09-20

## 2023-06-01 MED ORDER — ATORVASTATIN CALCIUM 40 MG PO TABS
ORAL_TABLET | ORAL | 1 refills | Status: DC
Start: 2023-06-01 — End: 2023-09-20

## 2023-06-01 MED ORDER — MELOXICAM 7.5 MG PO TABS
7.5000 mg | ORAL_TABLET | Freq: Every day | ORAL | 1 refills | Status: DC
Start: 2023-06-01 — End: 2023-06-01
  Filled 2023-06-01: qty 90, 90d supply, fill #0

## 2023-06-01 MED ORDER — POTASSIUM CHLORIDE ER 10 MEQ PO TBCR
10.0000 meq | EXTENDED_RELEASE_TABLET | Freq: Every day | ORAL | 1 refills | Status: DC
Start: 2023-06-01 — End: 2023-06-01
  Filled 2023-06-01: qty 90, 90d supply, fill #0

## 2023-06-01 MED ORDER — TIRZEPATIDE 12.5 MG/0.5ML ~~LOC~~ SOAJ
12.5000 mg | SUBCUTANEOUS | 0 refills | Status: DC
  Filled 2023-06-01: qty 2, 28d supply, fill #0

## 2023-06-01 MED ORDER — CARVEDILOL 6.25 MG PO TABS
6.2500 mg | ORAL_TABLET | Freq: Two times a day (BID) | ORAL | 1 refills | Status: DC
Start: 2023-06-01 — End: 2023-06-01
  Filled 2023-06-01: qty 180, 90d supply, fill #0

## 2023-06-01 MED ORDER — TIRZEPATIDE 12.5 MG/0.5ML ~~LOC~~ SOAJ: 12.5000 mg | SUBCUTANEOUS | 0 refills | Status: DC

## 2023-06-01 NOTE — Progress Notes (Signed)
Subjective:  Patient ID: Emily Dickson, female    DOB: 1956-03-19  Age: 67 y.o. MRN: [id number]  CC: Medical Management of Chronic Issues (Weight gain/Body pain)   HPI Emily Dickson is a 67 y.o. year old female with a history of hypertension, hyperlipidemia, chronic rhinitis, right knee osteoarthritis, type 2 DM (A1c 6.3).     Interval History: Discussed the use of AI scribe software for clinical note transcription with the patient, who gave verbal consent to proceed.   The patient, with a history of hypertension and diabetes, presents for follow-up of a left shoulder mass and high blood pressure. She reports a previous attempt at an MRI was unsuccessful due to the machine being too small for her body size. She notes that the swelling in her left arm appears to have decreased. Ultrasound of left axilla from 03/2023 revealed: RECOMMENDATION: 1.  Screening mammogram in one year.(Code:[id number]) 2. Suggest contrast enhanced shoulder MRI for further characterization of the large cystic structure in the deep left axilla. This is adjacent to the humeral head and deep vascular structures and may potentially be related to the shoulder joint.   I have discussed the findings and recommendations with the patient. If applicable, a reminder letter will be sent to the patient regarding the next appointment.   BI-RADS CATEGORY  1: Negative for breast cancer.     The patient also reports experiencing back pain, rating it as a 6 out of 10. She suspects the pain may be due to her mattress or possibly arthritis, as the pain seems to worsen with rainy weather. She has been taking turmeric, which she believes has helped with inflammation and improved her knee pain.  In addition to these concerns, her blood pressure was high this morning, which she attributes to stress related to her son being late for school. She confirms adherence to her hypertension medication and reports that her blood  pressure readings at home have generally been good, around 116/75.  Regarding her diabetes management, the patient reports she has been taking Mounjaro and has progressed to a 10mg  dose.  She also notes that she has not observed any weight loss since starting the medication, and expresses concern about an increase in abdominal size, which she is unsure is related to the medication.  She has lost 5 pounds in the last 4 months.       Past Medical History:  Diagnosis Date   Allergy    seasonal   Arthritis    Back pain    Blood transfusion without reported diagnosis    with child birth   Diabetes mellitus without complication (HCC)    High cholesterol    Hypertension     History reviewed. No pertinent surgical history.  Family History  Problem Relation Age of Onset   Colon cancer Neg Hx    Colon polyps Neg Hx    Esophageal cancer Neg Hx    Stomach cancer Neg Hx    Rectal cancer Neg Hx     Social History   Socioeconomic History   Marital status: Single    Spouse name: Not on file   Number of children: Not on file   Years of education: Not on file   Highest education level: Not on file  Occupational History   Not on file  Tobacco Use   Smoking status: Never   Smokeless tobacco: Never  Vaping Use   Vaping status: Never Used  Substance and Sexual Activity   Alcohol  use: No   Drug use: No   Sexual activity: Yes    Partners: Male    Birth control/protection: None  Other Topics Concern   Not on file  Social History Narrative   Not on file   Social Determinants of Health   Financial Resource Strain: Low Risk  (12/10/2022)   Overall Financial Resource Strain (CARDIA)    Difficulty of Paying Living Expenses: Not very hard  Food Insecurity: No Food Insecurity (12/10/2022)   Hunger Vital Sign    Worried About Running Out of Food in the Last Year: Never true    Ran Out of Food in the Last Year: Never true  Transportation Needs: No Transportation Needs (12/10/2022)    PRAPARE - Administrator, Civil Service (Medical): No    Lack of Transportation (Non-Medical): No  Physical Activity: Sufficiently Active (12/10/2022)   Exercise Vital Sign    Days of Exercise per Week: 5 days    Minutes of Exercise per Session: 30 min  Stress: No Stress Concern Present (12/10/2022)   [employer] of Occupational Health - Occupational Stress Questionnaire    Feeling of Stress : Only a little  Social Connections: Moderately Integrated (12/10/2022)   Social Connection and Isolation Panel [NHANES]    Frequency of Communication with Friends and Family: More than three times a week    Frequency of Social Gatherings with Friends and Family: More than three times a week    Attends Religious Services: More than 4 times per year    Active Member of [employer] or Organizations: Yes    Attends Engineer, structural: More than 4 times per year    Marital Status: Divorced    Allergies  Allergen Reactions   Penicillins Rash    Has patient had a PCN reaction causing immediate rash, facial/tongue/throat swelling, SOB or lightheadedness with hypotension: No Has patient had a PCN reaction causing severe rash involving mucus membranes or skin necrosis: No Has patient had a PCN reaction that required hospitalization No Has patient had a PCN reaction occurring within the last 10 years: No If all of the above answers are "NO", then may proceed with Cephalosporin use.    Outpatient Medications Prior to Visit  Medication Sig Dispense Refill   Accu-Chek Softclix Lancets lancets Use to check blood sugar three times daily. E11.9 100 each 3   Apple Cider Vinegar 500 MG TABS Take 500 mg by mouth one time only at 6 PM.     aspirin EC 81 MG tablet Take 1 tablet (81 mg total) by mouth daily. 90 tablet 1   b complex vitamins capsule Take 1 capsule by mouth daily.     Blood Glucose Monitoring Suppl (ACCU-CHEK GUIDE) w/Device KIT Use to check blood sugar three times daily. E11.9 1 kit  0   fluticasone (FLONASE) 50 MCG/ACT nasal spray Place 2 sprays into both nostrils daily. 16 g 6   Garlic 1000 MG CAPS Take by mouth.     glucose blood (ACCU-CHEK GUIDE) test strip Use to check blood sugar three times daily. E11.9 100 each 2   ibuprofen (ADVIL) 600 MG tablet Take 1 tablet (600 mg total) by mouth every 6 (six) hours as needed. 30 tablet 0   loratadine (GNP LORATADINE) 10 MG tablet Take 1 tablet (10 mg total) by mouth daily. 90 tablet 1   methocarbamol (ROBAXIN) 500 MG tablet Take 1 tablet (500 mg total) by mouth at bedtime as needed for muscle spasms. 10 tablet  0   Multiple Vitamin (MULTIVITAMIN WITH MINERALS) TABS tablet Take 1 tablet by mouth daily.     nitroGLYCERIN (NITROSTAT) 0.4 MG SL tablet Place 1 tablet (0.4 mg total) under the tongue every 5 (five) minutes as needed for chest pain. 30 tablet 1   atorvastatin (LIPITOR) 40 MG tablet TAKE 1 TABLET BY MOUTH ONCE DAILY AT  6  PM 90 tablet 0   carvedilol (COREG) 6.25 MG tablet TAKE 1 TABLET BY MOUTH TWICE DAILY WITH A MEAL 180 tablet 0   furosemide (LASIX) 40 MG tablet Take 1 tablet (40 mg total) by mouth daily. 90 tablet 1   liraglutide (VICTOZA) 18 MG/3ML SOPN Inject 1.8 mg into the skin daily. 9 mL 0   meloxicam (MOBIC) 7.5 MG tablet Take 1 tablet (7.5 mg total) by mouth daily. 90 tablet 1   potassium chloride (KLOR-CON) 10 MEQ tablet Take 1 tablet by mouth once daily 90 tablet 0   tirzepatide (MOUNJARO) 10 MG/0.5ML Pen Inject 10 mg into the skin once a week. (Patient not taking: Reported on 02/16/2023) 6 mL 6   tirzepatide (MOUNJARO) 7.5 MG/0.5ML Pen Inject 7.5 mg into the skin once a week. For 4 weeks then increase to 10 mg once a week (Patient not taking: Reported on 02/16/2023) 6 mL 0   valsartan-hydrochlorothiazide (DIOVAN-HCT) 320-25 MG tablet Take 1 tablet by mouth daily. 90 tablet 3   Facility-Administered Medications Prior to Visit  Medication Dose Route Frequency Provider Last Rate Last Admin   0.9 %  sodium  chloride infusion  500 mL Intravenous Continuous Nandigam, Kavitha V, MD         ROS Review of Systems  Constitutional:  Negative for activity change and appetite change.  HENT:  Negative for sinus pressure and sore throat.   Respiratory:  Negative for chest tightness, shortness of breath and wheezing.   Cardiovascular:  Negative for chest pain and palpitations.  Gastrointestinal:  Negative for abdominal distention, abdominal pain and constipation.  Genitourinary: Negative.   Musculoskeletal:  Positive for back pain.  Psychiatric/Behavioral:  Negative for behavioral problems and dysphoric mood.     Objective:  BP (!) 147/82   Pulse 66   Ht 5\' 7"  (1.702 m)   Wt 285 lb 3.2 oz (129.4 kg)   SpO2 98%   BMI 44.67 kg/m      06/01/2023    9:09 AM 06/01/2023    8:43 AM 02/16/2023   11:27 AM  BP/Weight  Systolic BP 147 161 124  Diastolic BP 82 92 76  Wt. (Lbs)  285.2   BMI  44.67 kg/m2     Wt Readings from Last 3 Encounters:  06/01/23 285 lb 3.2 oz (129.4 kg)  01/28/23 289 lb 9.6 oz (131.4 kg)  12/17/22 286 lb 3.2 oz (129.8 kg)      Physical Exam Constitutional:      Appearance: She is well-developed.  Cardiovascular:     Rate and Rhythm: Normal rate.     Heart sounds: Normal heart sounds. No murmur heard. Pulmonary:     Effort: Pulmonary effort is normal.     Breath sounds: Normal breath sounds. No wheezing or rales.  Chest:     Chest wall: No tenderness.  Abdominal:     General: Bowel sounds are normal. There is no distension.     Palpations: Abdomen is soft. There is no mass.     Tenderness: There is no abdominal tenderness.  Musculoskeletal:        General:  Normal range of motion.     Right lower leg: No edema.     Left lower leg: No edema.  Neurological:     Mental Status: She is alert and oriented to person, place, and time.  Psychiatric:        Mood and Affect: Mood normal.        Latest Ref Rng & Units 02/16/2023   11:52 AM 01/28/2023   10:08 AM  10/05/2022    9:29 AM  CMP  Glucose 70 - 99 mg/dL  93  86   BUN 8 - 27 mg/dL  14  12   Creatinine 8.29 - 1.00 mg/dL  5.62  1.30   Sodium 865 - 144 mmol/L  141  142   Potassium 3.5 - 5.2 mmol/L 4.0  3.9  4.1   Chloride 96 - 106 mmol/L  101  100   CO2 20 - 29 mmol/L  29  25   Calcium 8.7 - 10.3 mg/dL  9.5  9.9   Total Protein 6.0 - 8.5 g/dL   7.3   Total Bilirubin 0.0 - 1.2 mg/dL   0.2   Alkaline Phos 44 - 121 IU/L   71   AST 0 - 40 IU/L   17   ALT 0 - 32 IU/L   17     Lipid Panel     Component Value Date/Time   CHOL 164 10/05/2022 0929   TRIG 62 10/05/2022 0929   HDL 66 10/05/2022 0929   CHOLHDL 2.7 12/11/2020 1017   CHOLHDL 2.6 03/11/2016 1043   VLDL 13 03/11/2016 1043   LDLCALC 86 10/05/2022 0929    CBC    Component Value Date/Time   WBC 4.8 01/28/2023 1008   WBC 4.2 05/20/2016 1525   RBC 4.30 01/28/2023 1008   RBC 4.42 05/20/2016 1525   HGB 11.0 (L) 01/28/2023 1008   HCT 35.9 01/28/2023 1008   PLT 208 01/28/2023 1008   MCV 84 01/28/2023 1008   MCH 25.6 (L) 01/28/2023 1008   MCH 27.6 05/20/2016 1525   MCHC 30.6 (L) 01/28/2023 1008   MCHC 32.1 05/20/2016 1525   RDW 14.7 01/28/2023 1008   LYMPHSABS 1.9 01/28/2023 1008   MONOABS 0.8 06/18/2015 1815   EOSABS 0.2 01/28/2023 1008   BASOSABS 0.0 01/28/2023 1008    Lab Results  Component Value Date   HGBA1C 6.3 06/01/2023    Assessment & Plan:      Left Shoulder Mass Ultrasound revealed a 4.7 cm mass near the head of the humerus. MRI was not completed due to patient's size. Patient reports that the swelling has decreased. -Refer to orthopedics for further evaluation and management.  Hypertension Blood pressure elevated at today's visit, but patient reports stressors this morning. Patient is currently on Carvedilol 6.25mg  and Valsartan/hydrochlorothiazide 320/25mg . -No changes to medication at this time. -Counseled on blood pressure goal of less than 130/80, low-sodium, DASH diet, medication compliance, 150  minutes of moderate intensity exercise per week. Discussed medication compliance, adverse effects. -Recheck blood pressure at next visit.  Type 2 Diabetes A1c is 6.3, which is within target range. Patient is currently on Mounjaro, titrating up to 15mg . -Continue titration of Mounjaro as discussed. -Monitor for hypoglycemia.  Back Pain Patient reports chronic back pain, rating it as a 6/10. No signs of sciatica at this time. -Refer to physical therapy for pain management. -Advise continued use of heat and weight loss to relieve pressure on the back.  General Health Maintenance -Administer flu  shot today. -Continue Aspirin and Furosemide (patient needs refill). -Continue Meloxicam for inflammation. -Request eye exam records from [facility] for review.          Meds ordered this encounter  Medications   DISCONTD: atorvastatin (LIPITOR) 40 MG tablet    Sig: TAKE 1 TABLET BY MOUTH ONCE DAILY AT  6  PM    Dispense:  90 tablet    Refill:  1   DISCONTD: carvedilol (COREG) 6.25 MG tablet    Sig: Take 1 tablet (6.25 mg total) by mouth 2 (two) times daily with a meal.    Dispense:  180 tablet    Refill:  1   DISCONTD: potassium chloride (KLOR-CON) 10 MEQ tablet    Sig: Take 1 tablet (10 mEq total) by mouth daily.    Dispense:  90 tablet    Refill:  1   DISCONTD: valsartan-hydrochlorothiazide (DIOVAN-HCT) 320-25 MG tablet    Sig: Take 1 tablet by mouth daily.    Dispense:  90 tablet    Refill:  1   DISCONTD: tirzepatide (MOUNJARO) 12.5 MG/0.5ML Pen    Sig: Inject 12.5 mg into the skin once a week. For 4 weeks then increase to 15 mg once a week    Dispense:  6 mL    Refill:  0   DISCONTD: tirzepatide (MOUNJARO) 15 MG/0.5ML Pen    Sig: Inject 15 mg into the skin once a week.    Dispense:  6 mL    Refill:  6   DISCONTD: furosemide (LASIX) 40 MG tablet    Sig: Take 1 tablet (40 mg total) by mouth daily.    Dispense:  90 tablet    Refill:  1   DISCONTD: meloxicam (MOBIC) 7.5 MG  tablet    Sig: Take 1 tablet (7.5 mg total) by mouth daily.    Dispense:  90 tablet    Refill:  1   atorvastatin (LIPITOR) 40 MG tablet    Sig: TAKE 1 TABLET BY MOUTH ONCE DAILY AT  6  PM    Dispense:  90 tablet    Refill:  1   carvedilol (COREG) 6.25 MG tablet    Sig: Take 1 tablet (6.25 mg total) by mouth 2 (two) times daily with a meal.    Dispense:  180 tablet    Refill:  1   furosemide (LASIX) 40 MG tablet    Sig: Take 1 tablet (40 mg total) by mouth daily.    Dispense:  90 tablet    Refill:  1   meloxicam (MOBIC) 7.5 MG tablet    Sig: Take 1 tablet (7.5 mg total) by mouth daily.    Dispense:  90 tablet    Refill:  1   potassium chloride (KLOR-CON) 10 MEQ tablet    Sig: Take 1 tablet (10 mEq total) by mouth daily.    Dispense:  90 tablet    Refill:  1   tirzepatide (MOUNJARO) 12.5 MG/0.5ML Pen    Sig: Inject 12.5 mg into the skin once a week. For 4 weeks then increase to 15 mg once a week    Dispense:  6 mL    Refill:  0   tirzepatide (MOUNJARO) 15 MG/0.5ML Pen    Sig: Inject 15 mg into the skin once a week.    Dispense:  6 mL    Refill:  6   valsartan-hydrochlorothiazide (DIOVAN-HCT) 320-25 MG tablet    Sig: Take 1 tablet by mouth daily.    Dispense:  90 tablet    Refill:  1    Follow-up: Return in about 6 months (around 11/29/2023) for Chronic medical conditions.       Hoy Register, MD, FAAFP. [facility] [place], [place] [phone]   06/01/2023, 11:53 AM

## 2023-06-01 NOTE — Patient Instructions (Signed)

## 2023-06-02 LAB — CMP14+EGFR
ALT: 19 IU/L (ref 0–32)
AST: 21 IU/L (ref 0–40)
Albumin: 4 g/dL (ref 3.9–4.9)
Alkaline Phosphatase: 75 IU/L (ref 44–121)
BUN/Creatinine Ratio: 19 (ref 12–28)
BUN: 13 mg/dL (ref 8–27)
Bilirubin Total: 0.4 mg/dL (ref 0.0–1.2)
CO2: 27 mmol/L (ref 20–29)
Calcium: 10 mg/dL (ref 8.7–10.3)
Chloride: 101 mmol/L (ref 96–106)
Creatinine, Ser: 0.7 mg/dL (ref 0.57–1.00)
Globulin, Total: 3 g/dL (ref 1.5–4.5)
Glucose: 97 mg/dL (ref 70–99)
Potassium: 4.5 mmol/L (ref 3.5–5.2)
Sodium: 142 mmol/L (ref 134–144)
Total Protein: 7 g/dL (ref 6.0–8.5)
eGFR: 96 mL/min/{1.73_m2} (ref >59)

## 2023-06-02 LAB — LP+NON-HDL CHOLESTEROL
Cholesterol, Total: 150 mg/dL (ref 100–199)
HDL: 56 mg/dL (ref >39)
LDL Chol Calc (NIH): 77 mg/dL (ref 0–99)
Total Non-HDL-Chol (LDL+VLDL): 94 mg/dL (ref 0–129)
Triglycerides: 89 mg/dL (ref 0–149)
VLDL Cholesterol Cal: 17 mg/dL (ref 5–40)

## 2023-06-09 ENCOUNTER — Other Ambulatory Visit (INDEPENDENT_AMBULATORY_CARE_PROVIDER_SITE_OTHER): Payer: Medicare Other

## 2023-06-09 ENCOUNTER — Ambulatory Visit: Payer: Medicare Other | Admitting: Orthopaedic Surgery

## 2023-06-09 ENCOUNTER — Encounter: Payer: Self-pay | Admitting: Orthopaedic Surgery

## 2023-06-09 DIAGNOSIS — M25512 Pain in left shoulder: Secondary | ICD-10-CM

## 2023-06-09 DIAGNOSIS — G8929 Other chronic pain: Secondary | ICD-10-CM

## 2023-06-09 NOTE — Progress Notes (Signed)
Office Visit Note   Patient: Emily Dickson           Date of Birth: Feb 01, 1956           MRN: [id number] Visit Date: 06/09/2023              Requested by: Hoy Register, MD [address] [place] [address] [place],  [place] [address] PCP: Hoy Register, MD   Assessment & Plan: Visit Diagnoses:  1. Chronic left shoulder pain     Plan: Patient is a 67 year old female with incidental finding of a cystic mass in the left axillary region found on a routine mammogram.  Patient not reporting any symptoms.  Clinically I am not able to detect this mass.  She was unable to fit in a standard MRI therefore we will order an open MRI to evaluate the lesion.  Follow-up after the MRI.  Follow-Up Instructions: No follow-ups on file.   Orders:  Orders Placed This Encounter  Procedures   XR Shoulder Left   No orders of the defined types were placed in this encounter.     Procedures: No procedures performed   Clinical Data: No additional findings.   Subjective: Chief Complaint  Patient presents with   Left Shoulder - Pain    HPI Patient is a 67 year old female referral from PCP for evaluation of incidental left axillary mass found on routine mammogram.  Patient reports no symptoms in this area.  They tried to do an MRI but she could not fit into a standard machine. Review of Systems  Constitutional: Negative.   HENT: Negative.    Eyes: Negative.   Respiratory: Negative.    Cardiovascular: Negative.   Endocrine: Negative.   Musculoskeletal: Negative.   Neurological: Negative.   Hematological: Negative.   Psychiatric/Behavioral: Negative.    All other systems reviewed and are negative.    Objective: Vital Signs: There were no vitals taken for this visit.  Physical Exam Vitals and nursing note reviewed.  Constitutional:      Appearance: She is well-developed.  HENT:     Head: Atraumatic.     Nose: Nose normal.  Eyes:     Extraocular Movements: Extraocular movements  intact.  Cardiovascular:     Pulses: Normal pulses.  Pulmonary:     Effort: Pulmonary effort is normal.  Abdominal:     Palpations: Abdomen is soft.  Musculoskeletal:     Cervical back: Neck supple.  Skin:    General: Skin is warm.     Capillary Refill: Capillary refill takes less than 2 seconds.  Neurological:     Mental Status: She is alert. Mental status is at baseline.  Psychiatric:        Behavior: Behavior normal.        Thought Content: Thought content normal.        Judgment: Judgment normal.     Ortho Exam Exam of the left shoulder shows no palpable masses.  Exam is limited by body habitus.  No focal motor or sensory deficits.  No overlying skin changes. Specialty Comments:  No specialty comments available.  Imaging: XR Shoulder Left  Result Date: 06/09/2023 X-rays of the left shoulder show degenerative spurring of the acromion and distal clavicle.  No acute abnormalities.  No obvious soft tissue masses seen in the axilla.    PMFS History: Patient Active Problem List   Diagnosis Date Noted   Type 2 diabetes mellitus without complication, without long-term current use of insulin (HCC) 08/05/2021  Class 3 severe obesity due to excess calories without serious comorbidity with body mass index (BMI) of 40.0 to 44.9 in adult [facility]) 01/11/2017   AKI (acute kidney injury) (HCC) 06/10/2015   HLD (hyperlipidemia) 11/08/2014   Hypertensive urgency 02/27/2010   Past Medical History:  Diagnosis Date   Allergy    seasonal   Arthritis    Back pain    Blood transfusion without reported diagnosis    with child birth   Diabetes mellitus without complication (HCC)    High cholesterol    Hypertension     Family History  Problem Relation Age of Onset   Colon cancer Neg Hx    Colon polyps Neg Hx    Esophageal cancer Neg Hx    Stomach cancer Neg Hx    Rectal cancer Neg Hx     History reviewed. No pertinent surgical history. Social History   Occupational History    Not on file  Tobacco Use   Smoking status: Never   Smokeless tobacco: Never  Vaping Use   Vaping status: Never Used  Substance and Sexual Activity   Alcohol use: No   Drug use: No   Sexual activity: Yes    Partners: Male    Birth control/protection: None

## 2023-06-11 ENCOUNTER — Other Ambulatory Visit: Payer: Self-pay | Admitting: Family Medicine

## 2023-06-11 DIAGNOSIS — J3089 Other allergic rhinitis: Secondary | ICD-10-CM

## 2023-06-21 ENCOUNTER — Telehealth: Payer: Self-pay | Admitting: Family Medicine

## 2023-06-21 NOTE — Telephone Encounter (Signed)
Copied from CRM [id number]. Topic: General - Inquiry >> Jun 21, 2023  1:46 PM Runell Gess P wrote: Reason for CRM: pt wants to talk to provider about the price increase of the [facility].  CB#  [phone]

## 2023-06-21 NOTE — Telephone Encounter (Signed)
Pt has been called and informed to contact her insurance company

## 2023-07-20 ENCOUNTER — Encounter: Payer: Self-pay | Admitting: Family Medicine

## 2023-07-20 ENCOUNTER — Other Ambulatory Visit: Payer: Self-pay

## 2023-07-20 ENCOUNTER — Ambulatory Visit: Payer: Medicare Other | Attending: Family Medicine | Admitting: Family Medicine

## 2023-07-20 VITALS — BP 165/88 | HR 60 | Ht 67.0 in | Wt 291.4 lb

## 2023-07-20 DIAGNOSIS — E1149 Type 2 diabetes mellitus with other diabetic neurological complication: Secondary | ICD-10-CM | POA: Diagnosis not present

## 2023-07-20 DIAGNOSIS — E1159 Type 2 diabetes mellitus with other circulatory complications: Secondary | ICD-10-CM

## 2023-07-20 DIAGNOSIS — I152 Hypertension secondary to endocrine disorders: Secondary | ICD-10-CM | POA: Diagnosis not present

## 2023-07-20 DIAGNOSIS — E1169 Type 2 diabetes mellitus with other specified complication: Secondary | ICD-10-CM | POA: Diagnosis not present

## 2023-07-20 DIAGNOSIS — Z0000 Encounter for general adult medical examination without abnormal findings: Secondary | ICD-10-CM | POA: Diagnosis not present

## 2023-07-20 MED ORDER — GABAPENTIN 300 MG PO CAPS
300.0000 mg | ORAL_CAPSULE | Freq: Every evening | ORAL | 3 refills | Status: DC | PRN
Start: 2023-07-20 — End: 2023-09-20
  Filled 2023-07-20: qty 30, 30d supply, fill #0

## 2023-07-20 MED ORDER — METFORMIN HCL 500 MG PO TABS
500.0000 mg | ORAL_TABLET | Freq: Two times a day (BID) | ORAL | 0 refills | Status: DC
Start: 2023-07-20 — End: 2023-10-14
  Filled 2023-07-20: qty 180, 90d supply, fill #0

## 2023-07-20 NOTE — Progress Notes (Signed)
Subjective:    Emily Dickson is a 67 y.o. female who presents for a Welcome to Medicare exam.   Cardiac Risk Factors include: advanced age (>35men, >62 women);obesity (BMI >30kg/m2);hypertension     Her blood pressure is elevated but she states at home in the evenings her blood pressures have been in the 109 systolic range and she endorses adherence with her antihypertensive. She complains of inability to afford [facility] as she was informed it would cost $180 to obtain.  She currently does not have any medication for her diabetes mellitus and has noticed progressive weight gain for the last 1 month. Complains of intermittent burning on the lateral aspect of her right thigh which radiates down to her right leg and was present yesterday but is absent now. Objective:    Today's Vitals   07/20/23 1005 07/20/23 1106  BP: (!) 160/91 (!) 165/88  Pulse: 60   SpO2: 100%   Weight: 291 lb 6.4 oz (132.2 kg)   Height: 5\' 7"  (1.702 m)   PainSc: 6    Body mass index is 45.64 kg/m.  Medications Outpatient Encounter Medications as of 07/20/2023  Medication Sig   Accu-Chek Softclix Lancets lancets Use to check blood sugar three times daily. E11.9   Apple Cider Vinegar 500 MG TABS Take 500 mg by mouth one time only at 6 PM.   aspirin EC 81 MG tablet Take 1 tablet (81 mg total) by mouth daily.   atorvastatin (LIPITOR) 40 MG tablet TAKE 1 TABLET BY MOUTH ONCE DAILY AT  6  PM   b complex vitamins capsule Take 1 capsule by mouth daily.   Blood Glucose Monitoring Suppl (ACCU-CHEK GUIDE) w/Device KIT Use to check blood sugar three times daily. E11.9   carvedilol (COREG) 6.25 MG tablet Take 1 tablet (6.25 mg total) by mouth 2 (two) times daily with a meal.   EQ ALL DAY ALLERGY RELIEF 10 MG tablet Take 1 tablet by mouth once daily   fluticasone (FLONASE) 50 MCG/ACT nasal spray Place 2 sprays into both nostrils daily.   furosemide (LASIX) 40 MG tablet Take 1 tablet (40 mg total) by mouth daily.    gabapentin (NEURONTIN) 300 MG capsule Take 1 capsule (300 mg total) by mouth at bedtime as needed.   Garlic 1000 MG CAPS Take by mouth.   glucose blood (ACCU-CHEK GUIDE) test strip Use to check blood sugar three times daily. E11.9   meloxicam (MOBIC) 7.5 MG tablet Take 1 tablet (7.5 mg total) by mouth daily.   metFORMIN (GLUCOPHAGE) 500 MG tablet Take 1 tablet (500 mg total) by mouth 2 (two) times daily with a meal.   methocarbamol (ROBAXIN) 500 MG tablet Take 1 tablet (500 mg total) by mouth at bedtime as needed for muscle spasms.   Multiple Vitamin (MULTIVITAMIN WITH MINERALS) TABS tablet Take 1 tablet by mouth daily.   nitroGLYCERIN (NITROSTAT) 0.4 MG SL tablet Place 1 tablet (0.4 mg total) under the tongue every 5 (five) minutes as needed for chest pain.   potassium chloride (KLOR-CON) 10 MEQ tablet Take 1 tablet (10 mEq total) by mouth daily.   tirzepatide (MOUNJARO) 12.5 MG/0.5ML Pen Inject 12.5 mg into the skin once a week. For 4 weeks then increase to 15 mg once a week   tirzepatide (MOUNJARO) 15 MG/0.5ML Pen Inject 15 mg into the skin once a week.   valsartan-hydrochlorothiazide (DIOVAN-HCT) 320-25 MG tablet Take 1 tablet by mouth daily.   [DISCONTINUED] ibuprofen (ADVIL) 600 MG tablet Take 1  tablet (600 mg total) by mouth every 6 (six) hours as needed.   Facility-Administered Encounter Medications as of 07/20/2023  Medication   0.9 %  sodium chloride infusion     History: Past Medical History:  Diagnosis Date   Allergy    seasonal   Arthritis    Back pain    Blood transfusion without reported diagnosis    with child birth   Diabetes mellitus without complication (HCC)    High cholesterol    Hypertension    History reviewed. No pertinent surgical history.  Family History  Problem Relation Age of Onset   Colon cancer Neg Hx    Colon polyps Neg Hx    Esophageal cancer Neg Hx    Stomach cancer Neg Hx    Rectal cancer Neg Hx    Social History   Occupational History    Not on file  Tobacco Use   Smoking status: Never   Smokeless tobacco: Never  Vaping Use   Vaping status: Never Used  Substance and Sexual Activity   Alcohol use: No   Drug use: No   Sexual activity: Yes    Partners: Male    Birth control/protection: None    Tobacco Counseling Counseling given: Not Answered   Immunizations and Health Maintenance Immunization History  Administered Date(s) Administered   Fluad Trivalent(High Dose 65+) 06/01/2023   Influenza, Quadrivalent, Recombinant, Inj, Pf 06/10/2022   Influenza,inj,Quad PF,6+ Mos 07/07/2013, 08/01/2014, 06/20/2015, 09/21/2016, 07/21/2017, 06/15/2018, 07/10/2019, 07/16/2020, 08/05/2021   Moderna Sars-Covid-2 Vaccination 10/15/2019, 11/12/2019   PNEUMOCOCCAL CONJUGATE-20 08/05/2021   Pneumococcal Polysaccharide-23 02/24/2018   Tdap 07/21/2017   Zoster Recombinant(Shingrix) 01/13/2022, 03/15/2022   Health Maintenance Due  Topic Date Due   OPHTHALMOLOGY EXAM  02/26/2022   COVID-19 Vaccine (3 - 2023-24 season) 05/09/2023    Activities of Daily Living    07/20/2023   10:07 AM  In your present state of health, do you have any difficulty performing the following activities:  Hearing? 0  Vision? 0  Difficulty concentrating or making decisions? 0  Walking or climbing stairs? 0  Dressing or bathing? 0  Doing errands, shopping? 0  Preparing Food and eating ? N  Using the Toilet? N  In the past six months, have you accidently leaked urine? N  Do you have problems with loss of bowel control? N  Managing your Medications? Y  Managing your Finances? N  Housekeeping or managing your Housekeeping? N    Physical Exam   Physical Exam (optional), or other factors deemed appropriate based on the beneficiary's medical and social history and current clinical standards.   Advanced Directives: Does Patient Have a Medical Advance Directive?: No Would patient like information on creating a medical advance directive?: Yes (ED -  Information included in AVS)       Assessment:    This is a routine wellness examination for this patient .   Vision/Hearing screen No results found.   Goals      Exercise 150 min/wk Moderate Activity        Depression Screen    07/20/2023   10:10 AM 06/01/2023    8:44 AM 01/28/2023    9:01 AM 12/17/2022   10:13 AM  PHQ 2/9 Scores  PHQ - 2 Score 0 0 0 0  PHQ- 9 Score 0 1 3 4      Fall Risk    07/20/2023   10:09 AM  Fall Risk   Falls in the past year? 0  Number falls in  past yr: 0  Injury with Fall? 0  Risk for fall due to : No Fall Risks  Follow up Falls evaluation completed    Cognitive Function:        07/20/2023   10:11 AM  6CIT Screen  What Year? 0 points  What month? 0 points  What time? 0 points  Count back from 20 0 points  Months in reverse 0 points  Repeat phrase 0 points  Total Score 0 points    Patient Care Team: Hoy Register, MD as PCP - General (Family Medicine)     Plan:   1. Encounter for Medicare annual wellness exam Counseled on 150 minutes of exercise per week, healthy eating (including decreased daily intake of saturated fats, cholesterol, added sugars, sodium), routine healthcare maintenance.   2. Other diabetic neurological complication associated with type 2 diabetes mellitus (HCC) Initiate Gabapentin -discussed benefits and adverse effect - gabapentin (NEURONTIN) 300 MG capsule; Take 1 capsule (300 mg total) by mouth at bedtime as needed.  Dispense: 30 capsule; Refill: 3  3. Hypertension associated with diabetes (HCC) Uncontrolled She states blood pressures at home have been low and she is hesitant to initiate an additional antihypertensive Blood pressure was slightly elevated at her last visit. We will need to have her come in with her home blood pressure cuff so we can compare  4. Type 2 diabetes mellitus with other specified complication, without long-term current use of insulin (HCC) I have spoken with the  pharmacist who verified that she is currently in the Medicare donut hole hence the high co-pay for [facility].  I will place her on metformin and at her next visit in the beginning of the year prescribe Mounjaro. - metFORMIN (GLUCOPHAGE) 500 MG tablet; Take 1 tablet (500 mg total) by mouth 2 (two) times daily with a meal.  Dispense: 180 tablet; Refill: 0   I have personally reviewed and noted the following in the patient's chart:   Medical and social history Use of alcohol, tobacco or illicit drugs  Current medications and supplements Functional ability and status Nutritional status Physical activity Advanced directives List of other physicians Hospitalizations, surgeries, and ER visits in previous 12 months Vitals Screenings to include cognitive, depression, and falls Referrals and appointments  In addition, I have reviewed and discussed with patient certain preventive protocols, quality metrics, and best practice recommendations. A written personalized care plan for preventive services as well as general preventive health recommendations were provided to patient.     Hoy Register, MD 07/20/2023

## 2023-07-20 NOTE — Patient Instructions (Signed)
  Emily Dickson , Thank you for taking time to come for your Medicare Wellness Visit. I appreciate your ongoing commitment to your health goals. Please review the following plan we discussed and let me know if I can assist you in the future.   These are the goals we discussed:  Goals      Exercise 150 min/wk Moderate Activity        This is a list of the screening recommended for you and due dates:  Health Maintenance  Topic Date Due   Eye exam for diabetics  02/26/2022   COVID-19 Vaccine (3 - 2023-24 season) 05/09/2023   Yearly kidney health urinalysis for diabetes  10/06/2023   Complete foot exam   10/06/2023   Hemoglobin A1C  11/29/2023   Yearly kidney function blood test for diabetes  05/31/2024   Medicare Annual Wellness Visit  07/19/2024   Mammogram  03/16/2025   Pap with HPV screening  09/04/2026   Colon Cancer Screening  12/25/2026   DTaP/Tdap/Td vaccine (2 - Td or Tdap) 07/22/2027   Pneumonia Vaccine  Completed   Flu Shot  Completed   DEXA scan (bone density measurement)  Completed   Hepatitis C Screening  Completed   HIV Screening  Completed   Zoster (Shingles) Vaccine  Completed   HPV Vaccine  Aged Out

## 2023-07-21 ENCOUNTER — Other Ambulatory Visit: Payer: Self-pay

## 2023-08-30 ENCOUNTER — Encounter: Payer: Self-pay | Admitting: Family Medicine

## 2023-08-30 NOTE — Telephone Encounter (Signed)
 Care team updated and letter sent for eye exam notes.

## 2023-09-20 ENCOUNTER — Ambulatory Visit: Payer: Medicare Other | Attending: Family Medicine | Admitting: Family Medicine

## 2023-09-20 ENCOUNTER — Other Ambulatory Visit: Payer: Self-pay

## 2023-09-20 ENCOUNTER — Encounter: Payer: Self-pay | Admitting: Family Medicine

## 2023-09-20 VITALS — BP 134/86 | HR 66 | Ht 67.0 in | Wt 292.6 lb

## 2023-09-20 DIAGNOSIS — I152 Hypertension secondary to endocrine disorders: Secondary | ICD-10-CM

## 2023-09-20 DIAGNOSIS — E66813 Obesity, class 3: Secondary | ICD-10-CM

## 2023-09-20 DIAGNOSIS — E1149 Type 2 diabetes mellitus with other diabetic neurological complication: Secondary | ICD-10-CM | POA: Diagnosis not present

## 2023-09-20 DIAGNOSIS — Z7985 Long-term (current) use of injectable non-insulin antidiabetic drugs: Secondary | ICD-10-CM | POA: Diagnosis not present

## 2023-09-20 DIAGNOSIS — E1159 Type 2 diabetes mellitus with other circulatory complications: Secondary | ICD-10-CM | POA: Diagnosis not present

## 2023-09-20 DIAGNOSIS — E876 Hypokalemia: Secondary | ICD-10-CM | POA: Diagnosis not present

## 2023-09-20 DIAGNOSIS — R600 Localized edema: Secondary | ICD-10-CM | POA: Diagnosis not present

## 2023-09-20 DIAGNOSIS — E1169 Type 2 diabetes mellitus with other specified complication: Secondary | ICD-10-CM

## 2023-09-20 DIAGNOSIS — E785 Hyperlipidemia, unspecified: Secondary | ICD-10-CM

## 2023-09-20 DIAGNOSIS — Z6841 Body Mass Index (BMI) 40.0 and over, adult: Secondary | ICD-10-CM

## 2023-09-20 LAB — POCT GLYCOSYLATED HEMOGLOBIN (HGB A1C): HbA1c, POC (controlled diabetic range): 7 % (ref 0.0–7.0)

## 2023-09-20 MED ORDER — TIRZEPATIDE 5 MG/0.5ML ~~LOC~~ SOAJ
5.0000 mg | SUBCUTANEOUS | 6 refills | Status: DC
  Filled 2024-03-21: qty 6, 84d supply, fill #0

## 2023-09-20 MED ORDER — POTASSIUM CHLORIDE ER 10 MEQ PO TBCR: 10.0000 meq | EXTENDED_RELEASE_TABLET | Freq: Every day | ORAL | 1 refills | Status: DC

## 2023-09-20 MED ORDER — CARVEDILOL 6.25 MG PO TABS
6.2500 mg | ORAL_TABLET | Freq: Two times a day (BID) | ORAL | 1 refills | Status: DC
Start: 2023-09-20 — End: 2023-12-22

## 2023-09-20 MED ORDER — GABAPENTIN 300 MG PO CAPS: 300.0000 mg | ORAL_CAPSULE | Freq: Every evening | ORAL | 3 refills | Status: DC | PRN

## 2023-09-20 MED ORDER — TIRZEPATIDE 7.5 MG/0.5ML ~~LOC~~ SOAJ
7.5000 mg | SUBCUTANEOUS | 0 refills | Status: DC
Start: 2023-09-20 — End: 2024-04-26

## 2023-09-20 MED ORDER — VALSARTAN-HYDROCHLOROTHIAZIDE 320-25 MG PO TABS: 1.0000 | ORAL_TABLET | Freq: Every day | ORAL | 1 refills | Status: DC

## 2023-09-20 MED ORDER — TIRZEPATIDE 10 MG/0.5ML ~~LOC~~ SOAJ: 10.0000 mg | SUBCUTANEOUS | 0 refills | Status: DC

## 2023-09-20 MED ORDER — ATORVASTATIN CALCIUM 40 MG PO TABS
ORAL_TABLET | ORAL | 1 refills | Status: DC
Start: 2023-09-20 — End: 2023-12-22

## 2023-09-20 MED ORDER — FUROSEMIDE 40 MG PO TABS
40.0000 mg | ORAL_TABLET | Freq: Every day | ORAL | 1 refills | Status: DC
Start: 2023-09-20 — End: 2023-12-22

## 2023-09-20 NOTE — Progress Notes (Signed)
 Subjective:  Patient ID: Emily Dickson, female    DOB: 1955/11/11  Age: 68 y.o. MRN: [id number]  CC: Medical Management of Chronic Issues   HPI Emily Dickson is a 68 y.o. year old female with a history of hypertension, hyperlipidemia, chronic rhinitis, right knee osteoarthritis, type 2 DM (A1c 7.0).   Interval History: Discussed the use of AI scribe software for clinical note transcription with the patient, who gave verbal consent to proceed.   The patient, with a history of diabetes and hypertension, presents for a follow-up visit. The patient's A1c has increased from 6.3 to 7, which the patient attributes to changes in medication coverage. The patient has been on metformin  and is due to restart Mounjaro , a medication that was previously stopped due to insurance issues.  At her last visit she had been in the donut hole and could not afford Mounjaro .   Her blood pressure is controlled today and is improved compared to her last visit when it was elevated.  She endorses adherence with her antihypertensive. The patient also reports pedal swelling, which is managed with Lasix . The patient also takes potassium for diuretic induced hypokalemia.  The patient also mentions knee pain, which has improved.        Past Medical History:  Diagnosis Date   Allergy    seasonal   Arthritis    Back pain    Blood transfusion without reported diagnosis    with child birth   Diabetes mellitus without complication (HCC)    High cholesterol    Hypertension     No past surgical history on file.  Family History  Problem Relation Age of Onset   Colon cancer Neg Hx    Colon polyps Neg Hx    Esophageal cancer Neg Hx    Stomach cancer Neg Hx    Rectal cancer Neg Hx     Social History   Socioeconomic History   Marital status: Single    Spouse name: Not on file   Number of children: Not on file   Years of education: Not on file   Highest education level: Not on file  Occupational  History   Not on file  Tobacco Use   Smoking status: Never   Smokeless tobacco: Never  Vaping Use   Vaping status: Never Used  Substance and Sexual Activity   Alcohol use: No   Drug use: No   Sexual activity: Yes    Partners: Male    Birth control/protection: None  Other Topics Concern   Not on file  Social History Narrative   Not on file   Social Drivers of Health   Financial Resource Strain: Medium Risk (09/20/2023)   Overall Financial Resource Strain (CARDIA)    Difficulty of Paying Living Expenses: Somewhat hard  Food Insecurity: No Food Insecurity (09/20/2023)   Hunger Vital Sign    Worried About Running Out of Food in the Last Year: Never true    Ran Out of Food in the Last Year: Never true  Transportation Needs: No Transportation Needs (09/20/2023)   PRAPARE - Administrator, Civil Service (Medical): No    Lack of Transportation (Non-Medical): No  Physical Activity: Patient Declined (09/20/2023)   Exercise Vital Sign    Days of Exercise per Week: Patient declined    Minutes of Exercise per Session: Patient declined  Stress: Stress Concern Present (09/20/2023)   [employer] of Occupational Health - Occupational Stress Questionnaire    Feeling of  Stress : To some extent  Social Connections: Moderately Integrated (09/20/2023)   Social Connection and Isolation Panel [NHANES]    Frequency of Communication with Friends and Family: More than three times a week    Frequency of Social Gatherings with Friends and Family: More than three times a week    Attends Religious Services: 1 to 4 times per year    Active Member of [employer] or Organizations: Yes    Attends Engineer, Structural: More than 4 times per year    Marital Status: Separated    Allergies  Allergen Reactions   Penicillins Rash    Has patient had a PCN reaction causing immediate rash, facial/tongue/throat swelling, SOB or lightheadedness with hypotension: No Has patient had a PCN reaction  causing severe rash involving mucus membranes or skin necrosis: No Has patient had a PCN reaction that required hospitalization No Has patient had a PCN reaction occurring within the last 10 years: No If all of the above answers are NO, then may proceed with Cephalosporin use.    Outpatient Medications Prior to Visit  Medication Sig Dispense Refill   Accu-Chek Softclix Lancets lancets Use to check blood sugar three times daily. E11.9 100 each 3   Apple Cider Vinegar 500 MG TABS Take 500 mg by mouth one time only at 6 PM.     aspirin  EC 81 MG tablet Take 1 tablet (81 mg total) by mouth daily. 90 tablet 1   b complex vitamins capsule Take 1 capsule by mouth daily.     Blood Glucose Monitoring Suppl (ACCU-CHEK GUIDE) w/Device KIT Use to check blood sugar three times daily. E11.9 1 kit 0   EQ ALL DAY ALLERGY RELIEF 10 MG tablet Take 1 tablet by mouth once daily 90 tablet 0   fluticasone  (FLONASE ) 50 MCG/ACT nasal spray Place 2 sprays into both nostrils daily. 16 g 6   Garlic 1000 MG CAPS Take by mouth.     glucose blood (ACCU-CHEK GUIDE) test strip Use to check blood sugar three times daily. E11.9 100 each 2   meloxicam  (MOBIC ) 7.5 MG tablet Take 1 tablet (7.5 mg total) by mouth daily. 90 tablet 1   metFORMIN  (GLUCOPHAGE ) 500 MG tablet Take 1 tablet (500 mg total) by mouth 2 (two) times daily with a meal. 180 tablet 0   methocarbamol  (ROBAXIN ) 500 MG tablet Take 1 tablet (500 mg total) by mouth at bedtime as needed for muscle spasms. 10 tablet 0   Multiple Vitamin (MULTIVITAMIN WITH MINERALS) TABS tablet Take 1 tablet by mouth daily.     nitroGLYCERIN  (NITROSTAT ) 0.4 MG SL tablet Place 1 tablet (0.4 mg total) under the tongue every 5 (five) minutes as needed for chest pain. 30 tablet 1   atorvastatin  (LIPITOR) 40 MG tablet TAKE 1 TABLET BY MOUTH ONCE DAILY AT  6  PM 90 tablet 1   carvedilol  (COREG ) 6.25 MG tablet Take 1 tablet (6.25 mg total) by mouth 2 (two) times daily with a meal. 180 tablet  1   furosemide  (LASIX ) 40 MG tablet Take 1 tablet (40 mg total) by mouth daily. 90 tablet 1   gabapentin  (NEURONTIN ) 300 MG capsule Take 1 capsule (300 mg total) by mouth at bedtime as needed. 30 capsule 3   potassium chloride  (KLOR-CON ) 10 MEQ tablet Take 1 tablet (10 mEq total) by mouth daily. 90 tablet 1   valsartan -hydrochlorothiazide  (DIOVAN -HCT) 320-25 MG tablet Take 1 tablet by mouth daily. 90 tablet 1   tirzepatide  (MOUNJARO ) 12.5  MG/0.5ML Pen Inject 12.5 mg into the skin once a week. For 4 weeks then increase to 15 mg once a week (Patient not taking: Reported on 09/20/2023) 6 mL 0   tirzepatide  (MOUNJARO ) 15 MG/0.5ML Pen Inject 15 mg into the skin once a week. (Patient not taking: Reported on 09/20/2023) 6 mL 6   Facility-Administered Medications Prior to Visit  Medication Dose Route Frequency Provider Last Rate Last Admin   0.9 %  sodium chloride  infusion  500 mL Intravenous Continuous Nandigam, Kavitha V, MD         ROS Review of Systems  Constitutional:  Negative for activity change and appetite change.  HENT:  Negative for sinus pressure and sore throat.   Respiratory:  Negative for chest tightness, shortness of breath and wheezing.   Cardiovascular:  Negative for chest pain and palpitations.  Gastrointestinal:  Negative for abdominal distention, abdominal pain and constipation.  Genitourinary: Negative.   Musculoskeletal: Negative.   Psychiatric/Behavioral:  Negative for behavioral problems and dysphoric mood.     Objective:  BP 134/86   Pulse 66   Ht 5' 7 (1.702 m)   Wt 292 lb 9.6 oz (132.7 kg)   SpO2 99%   BMI 45.83 kg/m      09/20/2023    9:59 AM 07/20/2023   11:06 AM 07/20/2023   10:05 AM  BP/Weight  Systolic BP 134 165 160  Diastolic BP 86 88 91  Wt. (Lbs) 292.6  291.4  BMI 45.83 kg/m2  45.64 kg/m2    Wt Readings from Last 3 Encounters:  09/20/23 292 lb 9.6 oz (132.7 kg)  07/20/23 291 lb 6.4 oz (132.2 kg)  06/01/23 285 lb 3.2 oz (129.4 kg)       Physical Exam Constitutional:      Appearance: She is well-developed.  Cardiovascular:     Rate and Rhythm: Normal rate.     Heart sounds: Normal heart sounds. No murmur heard. Pulmonary:     Effort: Pulmonary effort is normal.     Breath sounds: Normal breath sounds. No wheezing or rales.  Chest:     Chest wall: No tenderness.  Abdominal:     General: Bowel sounds are normal. There is no distension.     Palpations: Abdomen is soft. There is no mass.     Tenderness: There is no abdominal tenderness.  Musculoskeletal:        General: Normal range of motion.     Right lower leg: No edema.     Left lower leg: No edema.  Neurological:     Mental Status: She is alert and oriented to person, place, and time.  Psychiatric:        Mood and Affect: Mood normal.        Latest Ref Rng & Units 06/01/2023    9:33 AM 02/16/2023   11:52 AM 01/28/2023   10:08 AM  CMP  Glucose 70 - 99 mg/dL 97   93   BUN 8 - 27 mg/dL 13   14   Creatinine 9.42 - 1.00 mg/dL 9.29   9.34   Sodium 865 - 144 mmol/L 142   141   Potassium 3.5 - 5.2 mmol/L 4.5  4.0  3.9   Chloride 96 - 106 mmol/L 101   101   CO2 20 - 29 mmol/L 27   29   Calcium  8.7 - 10.3 mg/dL 89.9   9.5   Total Protein 6.0 - 8.5 g/dL 7.0     Total Bilirubin  0.0 - 1.2 mg/dL 0.4     Alkaline Phos 44 - 121 IU/L 75     AST 0 - 40 IU/L 21     ALT 0 - 32 IU/L 19       Lipid Panel     Component Value Date/Time   CHOL 150 06/01/2023 0933   TRIG 89 06/01/2023 0933   HDL 56 06/01/2023 0933   CHOLHDL 2.7 12/11/2020 1017   CHOLHDL 2.6 03/11/2016 1043   VLDL 13 03/11/2016 1043   LDLCALC 77 06/01/2023 0933    CBC    Component Value Date/Time   WBC 4.8 01/28/2023 1008   WBC 4.2 05/20/2016 1525   RBC 4.30 01/28/2023 1008   RBC 4.42 05/20/2016 1525   HGB 11.0 (L) 01/28/2023 1008   HCT 35.9 01/28/2023 1008   PLT 208 01/28/2023 1008   MCV 84 01/28/2023 1008   MCH 25.6 (L) 01/28/2023 1008   MCH 27.6 05/20/2016 1525   MCHC 30.6 (L)  01/28/2023 1008   MCHC 32.1 05/20/2016 1525   RDW 14.7 01/28/2023 1008   LYMPHSABS 1.9 01/28/2023 1008   MONOABS 0.8 06/18/2015 1815   EOSABS 0.2 01/28/2023 1008   BASOSABS 0.0 01/28/2023 1008    Lab Results  Component Value Date   HGBA1C 7.0 09/20/2023    Assessment & Plan:      Type 2 Diabetes Mellitus/diabetic neuropathy A1c increased from 6.3 to 7.0 after discontinuation of Mounjaro  due to insurance coverage issues. Discussed the importance of maintaining a lower A1c for this patient and the plan to restart Mounjaro . -Submit a new prescription for Mounjaro , starting at 5mg , with a plan to increase the dose gradually. If the patient cannot tolerate 5mg , reduce to 2.5mg . -Discontinue Metformin  once Mounjaro  is started. -Plan to increase Mounjaro  to the maximum dose over time for weight loss benefits. -Controlled on gabapentin  -Check A1c in 3 months to assess response to Mounjaro .  Hypertension Blood pressure well controlled on current regimen. -Continue current antihypertensive medications.  Edema Managed with Lasix  as needed. -Continue Lasix  as needed for swelling.  Hypokalemia -Diuretic induced -Continue potassium supplement -Will check potassium level today.  Hyperlipidemia -Controlled -Low-cholesterol diet   General Health Maintenance -Ensure adequate supply of all current medications, including potassium. -Plan for follow-up visit in 3 months to assess response to Mounjaro  and overall health status.          Meds ordered this encounter  Medications   tirzepatide  (MOUNJARO ) 5 MG/0.5ML Pen    Sig: Inject 5 mg into the skin once a week.    Dispense:  6 mL    Refill:  6   tirzepatide  (MOUNJARO ) 7.5 MG/0.5ML Pen    Sig: Inject 7.5 mg into the skin once a week. For 4 weeks then increase to 10 mg once a week    Dispense:  6 mL    Refill:  0   atorvastatin  (LIPITOR) 40 MG tablet    Sig: TAKE 1 TABLET BY MOUTH ONCE DAILY AT  6  PM    Dispense:  90 tablet     Refill:  1   carvedilol  (COREG ) 6.25 MG tablet    Sig: Take 1 tablet (6.25 mg total) by mouth 2 (two) times daily with a meal.    Dispense:  180 tablet    Refill:  1   furosemide  (LASIX ) 40 MG tablet    Sig: Take 1 tablet (40 mg total) by mouth daily.    Dispense:  90 tablet  Refill:  1   gabapentin  (NEURONTIN ) 300 MG capsule    Sig: Take 1 capsule (300 mg total) by mouth at bedtime as needed.    Dispense:  30 capsule    Refill:  3   potassium chloride  (KLOR-CON ) 10 MEQ tablet    Sig: Take 1 tablet (10 mEq total) by mouth daily.    Dispense:  90 tablet    Refill:  1   valsartan -hydrochlorothiazide  (DIOVAN -HCT) 320-25 MG tablet    Sig: Take 1 tablet by mouth daily.    Dispense:  90 tablet    Refill:  1   tirzepatide  (MOUNJARO ) 10 MG/0.5ML Pen    Sig: Inject 10 mg into the skin once a week. For 4 weeks then increase to 12.5 mg once a week    Dispense:  6 mL    Refill:  0    Follow-up: Return in about 3 months (around 12/19/2023) for Chronic medical conditions.       Corrina Sabin, MD, FAAFP. [facility] [place], [place] [phone]   09/20/2023, 12:35 PM

## 2023-09-20 NOTE — Patient Instructions (Signed)
Tirzepatide Injection What is this medication? TIRZEPATIDE (tir ZEP a tide) treats type 2 diabetes. It works by increasing insulin levels in your body, which decreases your blood sugar (glucose). It also reduces the amount of sugar released into your blood and slows down your digestion. Changes to diet and exercise are often combined with this medication. This medicine may be used for other purposes; ask your health care provider or pharmacist if you have questions. COMMON BRAND NAME(S): MOUNJARO What should I tell my care team before I take this medication? They need to know if you have any of these conditions: Endocrine tumors (MEN 2) or if someone in your family had these tumors Eye disease, vision problems Gallbladder disease History of pancreatitis Kidney disease Stomach or intestine problems Thyroid cancer or if someone in your family had thyroid cancer An unusual or allergic reaction to tirzepatide, other medications, foods, dyes, or preservatives Pregnant or trying to get pregnant Breast-feeding How should I use this medication? This medication is injected under the skin. You will be taught how to prepare and give it. It is given once every week (every 7 days). Keep taking it unless your health care provider tells you to stop. If you use this medication with insulin, you should inject this medication and the insulin separately. Do not mix them together. Do not give the injections right next to each other. Change (rotate) injection sites with each injection. This medication comes with INSTRUCTIONS FOR USE. Ask your pharmacist for directions on how to use this medication. Read the information carefully. Talk to your pharmacist or care team if you have questions. It is important that you put your used needles and syringes in a special sharps container. Do not put them in a trash can. If you do not have a sharps container, call your pharmacist or care team to get one. A special MedGuide  will be given to you by the pharmacist with each prescription and refill. Be sure to read this information carefully each time. Talk to your care team about the use of this medication in children. Special care may be needed. Overdosage: If you think you have taken too much of this medicine contact a poison control center or emergency room at once. NOTE: This medicine is only for you. Do not share this medicine with others. What if I miss a dose? If you miss a dose, take it as soon as you can unless it is more than 4 days (96 hours) late. If it is more than 4 days late, skip the missed dose. Take the next dose at the normal time. Do not take 2 doses within 3 days of each other. What may interact with this medication? Alcohol Antiviral medications for HIV or AIDS Aspirin and aspirin-like medications Beta blockers, such as atenolol, metoprolol, propranolol Certain medications for blood pressure, heart disease, irregular heart beat Chromium Clonidine Diuretics Estrogen or progestin hormones Fenofibrate Gemfibrozil Guanethidine Isoniazid Lanreotide Female hormones or anabolic steroids MAOIs, such as Marplan, Nardil, and Parnate Medications for weight loss Medications for allergies, asthma, cold, or cough Medications for depression, anxiety, or mental health conditions Niacin Nicotine NSAIDs, medications for pain and inflammation, such as ibuprofen or naproxen Octreotide Other medications for diabetes, such as glyburide, glipizide, or glimepiride Pasireotide Pentamidine Phenytoin Probenecid Quinolone antibiotics, such as ciprofloxacin, levofloxacin, ofloxacin Reserpine Some herbal dietary supplements Steroid medications, such as prednisone or cortisone Sulfamethoxazole; trimethoprim Thyroid hormones Warfarin This list may not describe all possible interactions. Give your health care provider a  list of all the medicines, herbs, non-prescription drugs, or dietary supplements you use.  Also tell them if you smoke, drink alcohol, or use illegal drugs. Some items may interact with your medicine. What should I watch for while using this medication? Visit your care team for regular checks on your progress. You may need blood work done while you are taking this medication. Your care team will monitor your HbA1C (A1C). This test shows what your average blood sugar (glucose) level was over the past 2 to 3 months. Know the symptoms of low blood sugar and know how to treat it. Always carry a source of quick sugar with you. Examples include hard sugar candy or glucose tablets. Make sure others know that you can choke if you eat or drink if your blood sugar is too low and you are unable to care for yourself. Get medical help at once. Tell your care team if you have high blood sugar. Your medication dose may change if your body is under stress. Some types of stress that may affect your blood sugar include fever, infection, and surgery. Check with your care team if you have severe diarrhea, nausea, and vomiting, or if you sweat a lot. The loss of too much body fluid may make it dangerous for you to take this medication. Do not share pens or cartridges with anyone, even if the needle is changed. Each pen should only be used by one person. Sharing could cause an infection. Wear a medical ID bracelet or chain. Carry a card that describes your condition. List the medications and doses you take on the card. Talk to your care team about your risk of cancer. You may be more at risk for certain types of cancer if you take this medication. Estrogen and progestin hormones may not work as well while you are taking this medication. If you take these as pills by mouth, your care team may recommend another type of contraception for 4 weeks after you start this medication and for 4 weeks after each dose increase. Talk to your care team about contraceptive options. They can help you find the option that works for  you. What side effects may I notice from receiving this medication? Side effects that you should report to your care team as soon as possible: Allergic reactions or angioedema--skin rash, itching or hives, swelling of the face, eyes, lips, tongue, arms, or legs, trouble swallowing or breathing Change in vision Dehydration--increased thirst, dry mouth, feeling faint or lightheaded, headache, dark yellow or brown urine Gallbladder problems--severe stomach pain, nausea, vomiting, fever Kidney injury--decrease in the amount of urine, swelling of the ankles, hands, or feet Pancreatitis--severe stomach pain that spreads to your back or gets worse after eating or when touched, fever, nausea, vomiting Thoughts of suicide or self-harm, worsening mood, feelings of depression Thyroid cancer--new mass or lump in the neck, pain or trouble swallowing, trouble breathing, hoarseness Side effects that usually do not require medical attention (report these to your care team if they continue or are bothersome): Diarrhea Loss of appetite Nausea Upset stomach This list may not describe all possible side effects. Call your doctor for medical advice about side effects. You may report side effects to FDA at 1-800-FDA-1088. Where should I keep my medication? Keep out of the reach of children and pets. Refrigeration (preferred): Store in the refrigerator. Keep this medication in the original carton until you are ready to take it. Do not freeze. Protect from light. Get rid of opened vials  after use, even if there is medication left. Get rid of any unopened vials or pens after the expiration date. Room Temperature: This medication may be stored at room temperature below 30 degrees C (86 degrees F) for up to 21 days. Keep this medication in the original carton until you are ready to take it. Protect from light. Avoid exposure to extreme heat. Get rid of opened vials after use, even if there is medication left. Get rid of any  unopened vials or pens after 21 days, or after they expire, whichever is first. To get rid of medications that are no longer needed or have expired: Take the medication to a medication take-back program. Check with your pharmacy or law enforcement to find a location. If you cannot return the medication, ask your pharmacist or care team how to get rid of this medication safely. NOTE: This sheet is a summary. It may not cover all possible information. If you have questions about this medicine, talk to your doctor, pharmacist, or health care provider.  2024 Elsevier/Gold Standard (2022-12-17 00:00:00)

## 2023-09-22 ENCOUNTER — Telehealth: Payer: Self-pay | Admitting: Family Medicine

## 2023-09-22 LAB — CMP14+EGFR
ALT: 20 [IU]/L (ref 0–32)
AST: 20 [IU]/L (ref 0–40)
Albumin: 4.4 g/dL (ref 3.9–4.9)
Alkaline Phosphatase: 74 [IU]/L (ref 44–121)
BUN/Creatinine Ratio: 26 (ref 12–28)
BUN: 18 mg/dL (ref 8–27)
Bilirubin Total: 0.5 mg/dL (ref 0.0–1.2)
CO2: 29 mmol/L (ref 20–29)
Calcium: 10.4 mg/dL — ABNORMAL HIGH (ref 8.7–10.3)
Chloride: 98 mmol/L (ref 96–106)
Creatinine, Ser: 0.69 mg/dL (ref 0.57–1.00)
Globulin, Total: 3.3 g/dL (ref 1.5–4.5)
Glucose: 119 mg/dL — ABNORMAL HIGH (ref 70–99)
Potassium: 4.1 mmol/L (ref 3.5–5.2)
Sodium: 141 mmol/L (ref 134–144)
Total Protein: 7.7 g/dL (ref 6.0–8.5)
eGFR: 96 mL/min/{1.73_m2} (ref >59)

## 2023-09-22 LAB — LP+NON-HDL CHOLESTEROL
Cholesterol, Total: 180 mg/dL (ref 100–199)
HDL: 70 mg/dL (ref >39)
LDL Chol Calc (NIH): 92 mg/dL (ref 0–99)
Total Non-HDL-Chol (LDL+VLDL): 110 mg/dL (ref 0–129)
Triglycerides: 98 mg/dL (ref 0–149)
VLDL Cholesterol Cal: 18 mg/dL (ref 5–40)

## 2023-09-22 LAB — MICROALBUMIN / CREATININE URINE RATIO
Creatinine, Urine: 118.5 mg/dL
Microalb/Creat Ratio: 5 mg/g{creat} (ref 0–29)
Microalbumin, Urine: 5.4 ug/mL

## 2023-09-22 NOTE — Telephone Encounter (Signed)
 Pt called in states that her insurance won't cover the Emily Dickson until March. She is asking for temp supply until March

## 2023-09-23 ENCOUNTER — Other Ambulatory Visit: Payer: Self-pay

## 2023-09-24 NOTE — Telephone Encounter (Signed)
Spoke with k. Godwin for pharmacy advised of the following "I called and left a message for the patient advising that the office is not able to assist with a temporary supply and she would need to call Medicare Part D to set up payment arrangements on her deductible in order for her copay to reduce.    Call to patient unable to reach to advised of the above. VM left

## 2023-10-14 ENCOUNTER — Other Ambulatory Visit: Payer: Self-pay

## 2023-10-14 ENCOUNTER — Other Ambulatory Visit: Payer: Self-pay | Admitting: Family Medicine

## 2023-10-14 DIAGNOSIS — E1169 Type 2 diabetes mellitus with other specified complication: Secondary | ICD-10-CM

## 2023-10-14 MED ORDER — METFORMIN HCL 500 MG PO TABS
500.0000 mg | ORAL_TABLET | Freq: Two times a day (BID) | ORAL | 1 refills | Status: DC
  Filled 2023-10-14: qty 180, 90d supply, fill #0

## 2023-10-15 ENCOUNTER — Other Ambulatory Visit: Payer: Self-pay

## 2023-10-19 ENCOUNTER — Other Ambulatory Visit: Payer: Self-pay

## 2023-10-21 ENCOUNTER — Other Ambulatory Visit: Payer: Self-pay

## 2023-10-27 ENCOUNTER — Other Ambulatory Visit: Payer: Self-pay

## 2023-11-08 ENCOUNTER — Other Ambulatory Visit: Payer: Self-pay | Admitting: Family Medicine

## 2023-11-08 DIAGNOSIS — M5431 Sciatica, right side: Secondary | ICD-10-CM

## 2023-11-29 ENCOUNTER — Ambulatory Visit: Payer: Medicare Other | Admitting: Family Medicine

## 2023-12-18 ENCOUNTER — Other Ambulatory Visit: Payer: Self-pay | Admitting: Family Medicine

## 2023-12-18 DIAGNOSIS — G8929 Other chronic pain: Secondary | ICD-10-CM

## 2023-12-20 NOTE — Telephone Encounter (Signed)
 Requested Prescriptions  Pending Prescriptions Disp Refills   meloxicam (MOBIC) 7.5 MG tablet [Pharmacy Med Name: Meloxicam 7.5 MG Oral Tablet] 90 tablet 0    Sig: Take 1 tablet by mouth once daily     Analgesics:  COX2 Inhibitors Failed - 12/20/2023  2:40 PM      Failed - Manual Review: Labs are only required if the patient has taken medication for more than 8 weeks.      Failed - HGB in normal range and within 360 days    Hemoglobin  Date Value Ref Range Status  01/28/2023 11.0 (L) 11.1 - 15.9 g/dL Final         Passed - Cr in normal range and within 360 days    Creat  Date Value Ref Range Status  09/21/2016 0.54 0.50 - 1.05 mg/dL Final    Comment:      For patients > or = 66 years of age: The upper reference limit for Creatinine is approximately 13% higher for people identified as African-American.      Creatinine, Ser  Date Value Ref Range Status  09/20/2023 0.69 0.57 - 1.00 mg/dL Final         Passed - HCT in normal range and within 360 days    Hematocrit  Date Value Ref Range Status  01/28/2023 35.9 34.0 - 46.6 % Final         Passed - AST in normal range and within 360 days    AST  Date Value Ref Range Status  09/20/2023 20 0 - 40 IU/L Final         Passed - ALT in normal range and within 360 days    ALT  Date Value Ref Range Status  09/20/2023 20 0 - 32 IU/L Final         Passed - eGFR is 30 or above and within 360 days    GFR, Est African American  Date Value Ref Range Status  09/21/2016 >89 >=60 mL/min Final   GFR calc Af Amer  Date Value Ref Range Status  04/11/2020 108 >59 mL/min/1.73 Final    Comment:    **[facility] currently reports eGFR in compliance with the current**   recommendations of the [employer]. [facility] will   update reporting as new guidelines are published from the NKF-ASN   Task force.    GFR, Est Non African American  Date Value Ref Range Status  09/21/2016 >89 >=60 mL/min Final   GFR calc non Af Amer   Date Value Ref Range Status  04/11/2020 94 >59 mL/min/1.73 Final   eGFR  Date Value Ref Range Status  09/20/2023 96 >59 mL/min/1.73 Final         Passed - Patient is not pregnant      Passed - Valid encounter within last 12 months    Recent Outpatient Visits           3 months ago Type 2 diabetes mellitus with other specified complication, without long-term current use of insulin (HCC)   [facility] [place]. [facility] Joaquin Mulberry, MD   5 months ago Encounter for [employer] annual wellness exam   [facility] Of [place]. [facility] Joaquin Mulberry, MD   6 months ago Long-term current use of injectable noninsulin antidiabetic medication   [facility] - A Dept Of [place]. [facility] [place], [place],  MD   10 months ago Mass of left axilla   [facility] - A Dept Of [place]. [facility] Joaquin Mulberry, MD   10 months ago Edema of left upper arm   [facility] [place] - A Dept Of [place]. [facility] Joaquin Mulberry, MD       Future Appointments             In 2 days Joaquin Mulberry, MD [facility] [place] - A Dept Of [place]. [facility]

## 2023-12-22 ENCOUNTER — Other Ambulatory Visit: Payer: Self-pay

## 2023-12-22 ENCOUNTER — Encounter: Payer: Self-pay | Admitting: Family Medicine

## 2023-12-22 ENCOUNTER — Ambulatory Visit: Payer: Medicare Other | Attending: Family Medicine | Admitting: Family Medicine

## 2023-12-22 ENCOUNTER — Other Ambulatory Visit: Payer: Self-pay | Admitting: Pharmacist

## 2023-12-22 VITALS — BP 137/82 | HR 69 | Wt 279.0 lb

## 2023-12-22 DIAGNOSIS — Z7985 Long-term (current) use of injectable non-insulin antidiabetic drugs: Secondary | ICD-10-CM

## 2023-12-22 DIAGNOSIS — E1169 Type 2 diabetes mellitus with other specified complication: Secondary | ICD-10-CM

## 2023-12-22 DIAGNOSIS — M1711 Unilateral primary osteoarthritis, right knee: Secondary | ICD-10-CM

## 2023-12-22 DIAGNOSIS — M5431 Sciatica, right side: Secondary | ICD-10-CM

## 2023-12-22 DIAGNOSIS — Z7984 Long term (current) use of oral hypoglycemic drugs: Secondary | ICD-10-CM | POA: Diagnosis not present

## 2023-12-22 DIAGNOSIS — E876 Hypokalemia: Secondary | ICD-10-CM

## 2023-12-22 DIAGNOSIS — M549 Dorsalgia, unspecified: Secondary | ICD-10-CM | POA: Diagnosis not present

## 2023-12-22 DIAGNOSIS — I10 Essential (primary) hypertension: Secondary | ICD-10-CM | POA: Diagnosis not present

## 2023-12-22 DIAGNOSIS — E669 Obesity, unspecified: Secondary | ICD-10-CM

## 2023-12-22 DIAGNOSIS — E119 Type 2 diabetes mellitus without complications: Secondary | ICD-10-CM | POA: Diagnosis not present

## 2023-12-22 DIAGNOSIS — R600 Localized edema: Secondary | ICD-10-CM | POA: Diagnosis not present

## 2023-12-22 DIAGNOSIS — E1149 Type 2 diabetes mellitus with other diabetic neurological complication: Secondary | ICD-10-CM

## 2023-12-22 DIAGNOSIS — Z6841 Body Mass Index (BMI) 40.0 and over, adult: Secondary | ICD-10-CM

## 2023-12-22 DIAGNOSIS — E1159 Type 2 diabetes mellitus with other circulatory complications: Secondary | ICD-10-CM

## 2023-12-22 DIAGNOSIS — G8929 Other chronic pain: Secondary | ICD-10-CM

## 2023-12-22 LAB — POCT GLYCOSYLATED HEMOGLOBIN (HGB A1C): HbA1c, POC (controlled diabetic range): 6.5 % (ref 0.0–7.0)

## 2023-12-22 LAB — GLUCOSE, POCT (MANUAL RESULT ENTRY): POC Glucose: 77 mg/dL (ref 70–99)

## 2023-12-22 MED ORDER — VALSARTAN-HYDROCHLOROTHIAZIDE 320-25 MG PO TABS
1.0000 | ORAL_TABLET | Freq: Every day | ORAL | 1 refills | Status: DC
  Filled 2023-12-22: qty 90, 90d supply, fill #0

## 2023-12-22 MED ORDER — METFORMIN HCL 500 MG PO TABS
500.0000 mg | ORAL_TABLET | Freq: Two times a day (BID) | ORAL | 1 refills | Status: DC
  Filled 2023-12-22: qty 180, 90d supply, fill #0

## 2023-12-22 MED ORDER — CARVEDILOL 6.25 MG PO TABS
6.2500 mg | ORAL_TABLET | Freq: Two times a day (BID) | ORAL | 1 refills | Status: DC
  Filled 2023-12-22: qty 180, 90d supply, fill #0

## 2023-12-22 MED ORDER — ACCU-CHEK GUIDE W/DEVICE KIT
PACK | 0 refills | Status: AC
  Filled 2023-12-22: qty 1, 30d supply, fill #0

## 2023-12-22 MED ORDER — ACCU-CHEK SOFTCLIX LANCETS MISC
3 refills | Status: AC
  Filled 2023-12-22: qty 100, 33d supply, fill #0

## 2023-12-22 MED ORDER — ATORVASTATIN CALCIUM 40 MG PO TABS
ORAL_TABLET | ORAL | 1 refills | Status: DC
  Filled 2023-12-22: qty 90, fill #0

## 2023-12-22 MED ORDER — GABAPENTIN 300 MG PO CAPS
300.0000 mg | ORAL_CAPSULE | Freq: Every evening | ORAL | 3 refills | Status: DC | PRN
  Filled 2023-12-22: qty 30, 30d supply, fill #0

## 2023-12-22 MED ORDER — FUROSEMIDE 40 MG PO TABS
40.0000 mg | ORAL_TABLET | Freq: Every day | ORAL | 1 refills | Status: DC
  Filled 2023-12-22: qty 90, 90d supply, fill #0

## 2023-12-22 MED ORDER — POTASSIUM CHLORIDE ER 10 MEQ PO TBCR
10.0000 meq | EXTENDED_RELEASE_TABLET | Freq: Every day | ORAL | 1 refills | Status: DC
  Filled 2023-12-22 – 2024-05-15 (×3): qty 90, 90d supply, fill #0

## 2023-12-22 MED ORDER — ACCU-CHEK GUIDE TEST VI STRP
ORAL_STRIP | 6 refills | Status: AC
  Filled 2023-12-22: qty 100, 33d supply, fill #0

## 2023-12-22 MED ORDER — TIZANIDINE HCL 4 MG PO TABS
4.0000 mg | ORAL_TABLET | Freq: Three times a day (TID) | ORAL | 1 refills | Status: DC | PRN
  Filled 2023-12-22: qty 60, 20d supply, fill #0

## 2023-12-22 NOTE — Progress Notes (Signed)
 Subjective:  Patient ID: Emily Dickson, female    DOB: May 18, 1956  Age: 68 y.o. MRN: [id number]  CC: Medical Management of Chronic Issues (Patient stated that she was transferring a resident and hurt her back, went to [facility] and was given medication but still having pain)     Discussed the use of AI scribe software for clinical note transcription with the patient, who gave verbal consent to proceed.  History of Present Illness The patient, with a history of hypertension, hyperlipidemia, chronic rhinitis, right knee osteoarthritis, type 2 DM  presents with back pain following a work-related incident.  The patient was attempting to prevent a resident from falling and twisted her back in the process. The pain is severe enough to have caused the patient to take time off work and seek physical therapy. The patient reports that the pain radiates from the back down the right leg, describing it as a sharp pain. The patient has been given a Medrol dose pack for the pain and an injection.  She is currently being managed by Dr. Vaughn Georges send this is a workers comp case.  In addition to the back pain, the patient is struggling with diabetes management. The patient is currently on metformin 500mg  twice daily, but has had issues with insurance coverage for other medications, including Mounjaro and Ozempic. The patient's A1c has improved from 7 to 6.5.  She had been informed there would be a high deductible from her insurance and the pharmacy had advised her to call her insurance to set up a payment plan.  She is also requesting a prescription for glucometer.  The patient also reports knee pain, which has improved with the use of a deep heat treatment.     Past Medical History:  Diagnosis Date   Allergy    seasonal   Arthritis    Back pain    Blood transfusion without reported diagnosis    with child birth   Diabetes mellitus without complication (HCC)    High cholesterol    Hypertension      No past surgical history on file.  Family History  Problem Relation Age of Onset   Colon cancer Neg Hx    Colon polyps Neg Hx    Esophageal cancer Neg Hx    Stomach cancer Neg Hx    Rectal cancer Neg Hx     Social History   Socioeconomic History   Marital status: Single    Spouse name: Not on file   Number of children: Not on file   Years of education: Not on file   Highest education level: Not on file  Occupational History   Not on file  Tobacco Use   Smoking status: Never   Smokeless tobacco: Never  Vaping Use   Vaping status: Never Used  Substance and Sexual Activity   Alcohol use: No   Drug use: No   Sexual activity: Yes    Partners: Male    Birth control/protection: None  Other Topics Concern   Not on file  Social History Narrative   Not on file   Social Drivers of Health   Financial Resource Strain: Medium Risk (09/20/2023)   Overall Financial Resource Strain (CARDIA)    Difficulty of Paying Living Expenses: Somewhat hard  Food Insecurity: No Food Insecurity (09/20/2023)   Hunger Vital Sign    Worried About Running Out of Food in the Last Year: Never true    Ran Out of Food in the Last Year: Never  true  Transportation Needs: No Transportation Needs (09/20/2023)   PRAPARE - Administrator, Civil Service (Medical): No    Lack of Transportation (Non-Medical): No  Physical Activity: Patient Declined (09/20/2023)   Exercise Vital Sign    Days of Exercise per Week: Patient declined    Minutes of Exercise per Session: Patient declined  Stress: Stress Concern Present (09/20/2023)   [employer] of Occupational Health - Occupational Stress Questionnaire    Feeling of Stress : To some extent  Social Connections: Moderately Integrated (09/20/2023)   Social Connection and Isolation Panel [NHANES]    Frequency of Communication with Friends and Family: More than three times a week    Frequency of Social Gatherings with Friends and Family: More than  three times a week    Attends Religious Services: 1 to 4 times per year    Active Member of [employer] or Organizations: Yes    Attends Engineer, structural: More than 4 times per year    Marital Status: Separated    Allergies  Allergen Reactions   Penicillins Rash    Has patient had a PCN reaction causing immediate rash, facial/tongue/throat swelling, SOB or lightheadedness with hypotension: No Has patient had a PCN reaction causing severe rash involving mucus membranes or skin necrosis: No Has patient had a PCN reaction that required hospitalization No Has patient had a PCN reaction occurring within the last 10 years: No If all of the above answers are "NO", then may proceed with Cephalosporin use.    Outpatient Medications Prior to Visit  Medication Sig Dispense Refill   Accu-Chek Softclix Lancets lancets Use to check blood sugar three times daily. E11.9 100 each 3   Apple Cider Vinegar 500 MG TABS Take 500 mg by mouth one time only at 6 PM.     aspirin EC 81 MG tablet Take 1 tablet (81 mg total) by mouth daily. 90 tablet 1   atorvastatin (LIPITOR) 40 MG tablet TAKE 1 TABLET BY MOUTH ONCE DAILY AT  6  PM 90 tablet 1   b complex vitamins capsule Take 1 capsule by mouth daily.     Blood Glucose Monitoring Suppl (ACCU-CHEK GUIDE) w/Device KIT Use to check blood sugar three times daily. E11.9 1 kit 0   carvedilol (COREG) 6.25 MG tablet Take 1 tablet (6.25 mg total) by mouth 2 (two) times daily with a meal. 180 tablet 1   EQ ALL DAY ALLERGY RELIEF 10 MG tablet Take 1 tablet by mouth once daily 90 tablet 0   fluticasone (FLONASE) 50 MCG/ACT nasal spray Place 2 sprays into both nostrils daily. 16 g 6   furosemide (LASIX) 40 MG tablet Take 1 tablet (40 mg total) by mouth daily. 90 tablet 1   gabapentin (NEURONTIN) 300 MG capsule Take 1 capsule (300 mg total) by mouth at bedtime as needed. 30 capsule 3   Garlic 1000 MG CAPS Take by mouth.     glucose blood (ACCU-CHEK GUIDE) test strip  Use to check blood sugar three times daily. E11.9 100 each 2   meloxicam (MOBIC) 7.5 MG tablet Take 1 tablet by mouth once daily 90 tablet 0   metFORMIN (GLUCOPHAGE) 500 MG tablet Take 1 tablet (500 mg total) by mouth 2 (two) times daily with a meal. 180 tablet 1   Multiple Vitamin (MULTIVITAMIN WITH MINERALS) TABS tablet Take 1 tablet by mouth daily.     nitroGLYCERIN (NITROSTAT) 0.4 MG SL tablet Place 1 tablet (0.4 mg total) under  the tongue every 5 (five) minutes as needed for chest pain. 30 tablet 1   potassium chloride (KLOR-CON) 10 MEQ tablet Take 1 tablet (10 mEq total) by mouth daily. 90 tablet 1   tirzepatide (MOUNJARO) 10 MG/0.5ML Pen Inject 10 mg into the skin once a week. For 4 weeks then increase to 12.5 mg once a week 6 mL 0   tirzepatide (MOUNJARO) 5 MG/0.5ML Pen Inject 5 mg into the skin once a week. 6 mL 6   tirzepatide (MOUNJARO) 7.5 MG/0.5ML Pen Inject 7.5 mg into the skin once a week. For 4 weeks then increase to 10 mg once a week 6 mL 0   tiZANidine (ZANAFLEX) 4 MG tablet TAKE 1 TABLET BY MOUTH EVERY 8 HOURS AS NEEDED FOR MUSCLE SPASM 60 tablet 0   valsartan-hydrochlorothiazide (DIOVAN-HCT) 320-25 MG tablet Take 1 tablet by mouth daily. 90 tablet 1   Facility-Administered Medications Prior to Visit  Medication Dose Route Frequency Provider Last Rate Last Admin   0.9 %  sodium chloride infusion  500 mL Intravenous Continuous Nandigam, Kavitha V, MD         ROS Review of Systems  Constitutional:  Negative for activity change and appetite change.  HENT:  Negative for sinus pressure and sore throat.   Respiratory:  Negative for chest tightness, shortness of breath and wheezing.   Cardiovascular:  Negative for chest pain and palpitations.  Gastrointestinal:  Negative for abdominal distention, abdominal pain and constipation.  Genitourinary: Negative.   Musculoskeletal: Negative.   Psychiatric/Behavioral:  Negative for behavioral problems and dysphoric mood.      Objective:  BP 137/82 (BP Location: Left Arm, Patient Position: Sitting, Cuff Size: Large)   Pulse 69   Wt 279 lb (126.6 kg)   SpO2 97%   BMI 43.70 kg/m      12/22/2023    9:14 AM 12/22/2023    9:12 AM 09/20/2023    9:59 AM  BP/Weight  Systolic BP 137  253  Diastolic BP 82  86  Wt. (Lbs)  279 292.6  BMI  43.7 kg/m2 45.83 kg/m2      Physical Exam Constitutional:      Appearance: She is well-developed.  Cardiovascular:     Rate and Rhythm: Normal rate.     Heart sounds: Normal heart sounds. No murmur heard. Pulmonary:     Effort: Pulmonary effort is normal.     Breath sounds: Normal breath sounds. No wheezing or rales.  Chest:     Chest wall: No tenderness.  Abdominal:     General: Bowel sounds are normal. There is no distension.     Palpations: Abdomen is soft. There is no mass.     Tenderness: There is no abdominal tenderness.  Musculoskeletal:        General: Normal range of motion.     Right lower leg: No edema.     Left lower leg: Edema (L lateral malleolus) present.  Neurological:     Mental Status: She is alert and oriented to person, place, and time.  Psychiatric:        Mood and Affect: Mood normal.    Diabetic Foot Exam - Simple   Simple Foot Form Diabetic Foot exam was performed with the following findings: Yes 12/22/2023  9:59 AM  Visual Inspection No deformities, no ulcerations, no other skin breakdown bilaterally: Yes Sensation Testing Intact to touch and monofilament testing bilaterally: Yes Pulse Check Posterior Tibialis and Dorsalis pulse intact bilaterally: Yes Comments  Latest Ref Rng & Units 09/20/2023   10:53 AM 06/01/2023    9:33 AM 02/16/2023   11:52 AM  CMP  Glucose 70 - 99 mg/dL 130  97    BUN 8 - 27 mg/dL 18  13    Creatinine 8.65 - 1.00 mg/dL 7.84  6.96    Sodium 295 - 144 mmol/L 141  142    Potassium 3.5 - 5.2 mmol/L 4.1  4.5  4.0   Chloride 96 - 106 mmol/L 98  101    CO2 20 - 29 mmol/L 29  27    Calcium 8.7 -  10.3 mg/dL 28.4  13.2    Total Protein 6.0 - 8.5 g/dL 7.7  7.0    Total Bilirubin 0.0 - 1.2 mg/dL 0.5  0.4    Alkaline Phos 44 - 121 IU/L 74  75    AST 0 - 40 IU/L 20  21    ALT 0 - 32 IU/L 20  19      Lipid Panel     Component Value Date/Time   CHOL 180 09/20/2023 1053   TRIG 98 09/20/2023 1053   HDL 70 09/20/2023 1053   CHOLHDL 2.7 12/11/2020 1017   CHOLHDL 2.6 03/11/2016 1043   VLDL 13 03/11/2016 1043   LDLCALC 92 09/20/2023 1053    CBC    Component Value Date/Time   WBC 4.8 01/28/2023 1008   WBC 4.2 05/20/2016 1525   RBC 4.30 01/28/2023 1008   RBC 4.42 05/20/2016 1525   HGB 11.0 (L) 01/28/2023 1008   HCT 35.9 01/28/2023 1008   PLT 208 01/28/2023 1008   MCV 84 01/28/2023 1008   MCH 25.6 (L) 01/28/2023 1008   MCH 27.6 05/20/2016 1525   MCHC 30.6 (L) 01/28/2023 1008   MCHC 32.1 05/20/2016 1525   RDW 14.7 01/28/2023 1008   LYMPHSABS 1.9 01/28/2023 1008   MONOABS 0.8 06/18/2015 1815   EOSABS 0.2 01/28/2023 1008   BASOSABS 0.0 01/28/2023 1008    Lab Results  Component Value Date   HGBA1C 6.5 12/22/2023       Assessment & Plan Back Pain with Sciatica Back pain radiating down the right leg, likely sciatica. Pain began after a work-related incident. Undergoing physical therapy and received an injection and Medrol dose pack. - Continue physical therapy. - Use heat application for pain relief. - Continue to follow-up with Dr. Vaughn Georges  Type 2 Diabetes Mellitus Diabetes managed with Metformin. A1c improved from 7.0 to 6.5. Insurance issues with obtaining additional medications. - Continue Metformin 500 mg orally twice daily. - Monitor blood glucose levels regularly. - Investigate insurance options for additional diabetes medications. - Pharmacy technician has verified that the patient had called her insurance company to set up a payment plan and Mounjaro will be covered - Glucometer prescription sent to the pharmacy  Obesity Diagnosed with obesity. Difficulty  obtaining insurance coverage for weight loss medications. Attempting lifestyle modifications. -Hopefully she can have her Mounjaro filled, we will work with the pharmacy on this. - Advise continuation of lifestyle modifications and exercise.   Hypertension Blood pressure well-controlled. - Continue current antihypertensive regimen. -Counseled on blood pressure goal of less than 130/80, low-sodium, DASH diet, medication compliance, 150 minutes of moderate intensity exercise per week. Discussed medication compliance, adverse effects.   Right Knee Osteoarthritis Improvement in knee pain with topical treatments. - Continue using NSAIDs.  Pedal Edema Mild swelling managed with Lasix. - Continue Furosemide (Lasix) 40 mg orally daily.  General Health Maintenance Pap  smears not necessary after age 12. Bone density test completed. Mammogram due in July. - Schedule mammogram for July.       No orders of the defined types were placed in this encounter.   Follow-up: No follow-ups on file.       Joaquin Mulberry, MD, FAAFP. [facility] [place], [place] [phone]   12/22/2023, 9:37 AM

## 2023-12-22 NOTE — Patient Instructions (Signed)
 VISIT SUMMARY:  You came in today because of back pain that started after a work-related incident. You also discussed your diabetes management, knee pain, obesity, and general health maintenance. We reviewed your current medications and made some adjustments to your treatment plan.  YOUR PLAN:  -BACK PAIN WITH SCIATICA: You have back pain that radiates down your right leg, which is likely sciatica. This started after you twisted your back at work. Continue with physical therapy and use heat application to help relieve the pain.  -TYPE 2 DIABETES MELLITUS: Your diabetes is being managed with Metformin, and your A1c has improved from 7.0 to 6.5. Continue taking Metformin 500 mg twice daily and monitor your blood glucose levels regularly. We will look into insurance options for additional diabetes medications.  -OBESITY: You have been diagnosed with obesity and are having difficulty getting insurance coverage for weight loss medications. Continue with lifestyle modifications and exercise. We will inquire about obtaining Ozempic or other weight loss medications and follow up with your insurance company in April regarding coverage changes.  -HYPERTENSION: Your blood pressure is well-controlled. Continue with your current blood pressure medications.  -RIGHT KNEE OSTEOARTHRITIS: Your knee pain has improved with the use of topical treatments. Continue using Afrin and Deep Heat for managing the pain.  -EDEMA: You have mild swelling that is being managed with Lasix. Continue taking Furosemide (Lasix) 40 mg daily.  -GENERAL HEALTH MAINTENANCE: You do not need Pap smears after age 15. Your bone density test has been completed, and your next mammogram is due in July. Please schedule your mammogram for July and review your bone density test results.  INSTRUCTIONS:  Please follow up with your insurance company in April regarding coverage changes for weight loss medications. Schedule your mammogram for July  and review your bone density test results.

## 2024-01-19 ENCOUNTER — Other Ambulatory Visit: Payer: Self-pay

## 2024-02-03 ENCOUNTER — Other Ambulatory Visit: Payer: Self-pay | Admitting: Family Medicine

## 2024-02-03 DIAGNOSIS — E1149 Type 2 diabetes mellitus with other diabetic neurological complication: Secondary | ICD-10-CM

## 2024-02-04 ENCOUNTER — Other Ambulatory Visit: Payer: Self-pay

## 2024-02-10 ENCOUNTER — Other Ambulatory Visit: Payer: Self-pay | Admitting: Family Medicine

## 2024-02-10 DIAGNOSIS — Z1231 Encounter for screening mammogram for malignant neoplasm of breast: Secondary | ICD-10-CM

## 2024-02-11 ENCOUNTER — Telehealth: Payer: Self-pay | Admitting: Family Medicine

## 2024-02-11 NOTE — Telephone Encounter (Signed)
 Copied from CRM [id number]. Topic: Clinical - Medication Question >> Feb 11, 2024  1:31 PM Lotus Round B wrote: Reason for CRM: pt called in to see if Dr. newlin would like her to continue taking metformin  and monjaro both at same time . If she can get a call

## 2024-02-11 NOTE — Telephone Encounter (Signed)
 VM has been left informing patient to continue both medications.

## 2024-03-16 ENCOUNTER — Other Ambulatory Visit: Payer: Self-pay | Admitting: Family Medicine

## 2024-03-16 DIAGNOSIS — G8929 Other chronic pain: Secondary | ICD-10-CM

## 2024-03-17 ENCOUNTER — Ambulatory Visit
Admission: RE | Admit: 2024-03-17 | Discharge: 2024-03-17 | Disposition: A | Discharge status: Home / Self Care | Source: Ambulatory Visit | Attending: Family Medicine | Admitting: Family Medicine

## 2024-03-17 ENCOUNTER — Other Ambulatory Visit: Payer: Self-pay

## 2024-03-17 DIAGNOSIS — Z1231 Encounter for screening mammogram for malignant neoplasm of breast: Secondary | ICD-10-CM | POA: Diagnosis not present

## 2024-03-17 MED FILL — Meloxicam Tab 7.5 MG: ORAL | 90 days supply | Qty: 90 | Fill #0 | Status: CN

## 2024-03-20 ENCOUNTER — Other Ambulatory Visit: Payer: Self-pay

## 2024-03-21 ENCOUNTER — Other Ambulatory Visit (HOSPITAL_COMMUNITY): Payer: Self-pay

## 2024-03-21 ENCOUNTER — Other Ambulatory Visit: Payer: Self-pay

## 2024-03-21 MED FILL — Meloxicam Tab 7.5 MG: ORAL | 90 days supply | Qty: 90 | Fill #0 | Status: AC

## 2024-03-22 ENCOUNTER — Other Ambulatory Visit: Payer: Self-pay

## 2024-03-22 ENCOUNTER — Ambulatory Visit: Payer: Self-pay | Admitting: Family Medicine

## 2024-03-30 ENCOUNTER — Other Ambulatory Visit: Payer: Self-pay

## 2024-04-25 ENCOUNTER — Other Ambulatory Visit: Payer: Self-pay

## 2024-04-25 ENCOUNTER — Telehealth: Payer: Self-pay | Admitting: Family Medicine

## 2024-04-25 ENCOUNTER — Other Ambulatory Visit: Payer: Self-pay | Admitting: Family Medicine

## 2024-04-25 ENCOUNTER — Other Ambulatory Visit (HOSPITAL_BASED_OUTPATIENT_CLINIC_OR_DEPARTMENT_OTHER): Payer: Self-pay

## 2024-04-25 ENCOUNTER — Other Ambulatory Visit (HOSPITAL_COMMUNITY): Payer: Self-pay

## 2024-04-25 DIAGNOSIS — Z7985 Long-term (current) use of injectable non-insulin antidiabetic drugs: Secondary | ICD-10-CM

## 2024-04-25 DIAGNOSIS — E1169 Type 2 diabetes mellitus with other specified complication: Secondary | ICD-10-CM

## 2024-04-25 NOTE — Telephone Encounter (Signed)
 Copied from CRM [id number]. Topic: Clinical - Prescription Issue >> Apr 25, 2024  1:44 PM Wess RAMAN wrote: Reason for CRM: Patient states the pharmacy informed her that she should be taking tirzepatide  (MOUNJARO ) 7.5 MG/0.5ML Pen. Patient stated she was taking 5 MG and should be on the 7.5 MG but the 5MG  was called in again. She would like to know which is correct  Callback #: [phone]  Pharmacy: [facility] [address] [place] [address] Phone: [phone] Fax: [phone] Hours: M-F 7:30a-7:00p

## 2024-04-25 NOTE — Telephone Encounter (Signed)
 At her last visit with me, her insurance did not approve Mounjaro  and she was to call them to set up a payment plan. Please check with her if this is her first dose and if so then we will need to start at 2.5mg . Thanks

## 2024-04-26 ENCOUNTER — Other Ambulatory Visit: Payer: Self-pay

## 2024-04-26 MED ORDER — TIRZEPATIDE 10 MG/0.5ML ~~LOC~~ SOAJ
10.0000 mg | SUBCUTANEOUS | 2 refills | Status: DC
  Filled 2024-04-26 – 2024-06-01 (×3): qty 6, 84d supply, fill #0
  Filled 2024-06-01: qty 2, 28d supply, fill #0
  Filled 2024-06-01: qty 6, 84d supply, fill #0
  Filled 2024-07-03: qty 2, 28d supply, fill #1
  Filled 2024-08-09: qty 2, 28d supply, fill #2

## 2024-04-26 MED ORDER — TIRZEPATIDE 7.5 MG/0.5ML ~~LOC~~ SOAJ
7.5000 mg | SUBCUTANEOUS | 0 refills | Status: DC
  Filled 2024-04-26 – 2024-05-03 (×2): qty 2, 28d supply, fill #0
  Filled 2024-05-31: qty 2, 28d supply, fill #1

## 2024-04-26 NOTE — Telephone Encounter (Signed)
 Patient wants to know which dosage of medication she should be taking. Patient states that she is currently taking 5mg 

## 2024-04-26 NOTE — Telephone Encounter (Signed)
 If she is currently taking 5 mg of Mounjaro  and has been taking this for 4 weeks I am sending a prescription for 7.5 mg to her pharmacy.  She will take that for 4 weeks then increase to 10 mg.  Thanks.

## 2024-04-26 NOTE — Addendum Note (Signed)
 Addended by: Sanuel Ladnier on: 04/26/2024 01:49 PM   Modules accepted: Orders

## 2024-04-27 NOTE — Telephone Encounter (Signed)
 Patient has been informed.

## 2024-05-01 ENCOUNTER — Other Ambulatory Visit: Payer: Self-pay

## 2024-05-01 DIAGNOSIS — E1169 Type 2 diabetes mellitus with other specified complication: Secondary | ICD-10-CM

## 2024-05-01 MED ORDER — METFORMIN HCL 500 MG PO TABS: 500.0000 mg | ORAL_TABLET | Freq: Two times a day (BID) | ORAL | 1 refills | Status: DC

## 2024-05-03 ENCOUNTER — Other Ambulatory Visit: Payer: Self-pay

## 2024-05-03 ENCOUNTER — Other Ambulatory Visit (HOSPITAL_COMMUNITY): Payer: Self-pay

## 2024-05-09 ENCOUNTER — Other Ambulatory Visit: Payer: Self-pay | Admitting: Family Medicine

## 2024-05-09 DIAGNOSIS — E876 Hypokalemia: Secondary | ICD-10-CM

## 2024-05-09 NOTE — Telephone Encounter (Signed)
 Rx 12/22/23 #90 1RF- too soon Requested Prescriptions  Pending Prescriptions Disp Refills   potassium chloride  (KLOR-CON ) 10 MEQ tablet [Pharmacy Med Name: Potassium Chloride  ER 10 MEQ Oral Tablet Extended Release] 90 tablet 0    Sig: Take 1 tablet by mouth once daily     Endocrinology:  Minerals - Potassium Supplementation Passed - 05/09/2024  3:42 PM      Passed - K in normal range and within 360 days    Potassium  Date Value Ref Range Status  09/20/2023 4.1 3.5 - 5.2 mmol/L Final         Passed - Cr in normal range and within 360 days    Creat  Date Value Ref Range Status  09/21/2016 0.54 0.50 - 1.05 mg/dL Final    Comment:      For patients > or = 66 years of age: The upper reference limit for Creatinine is approximately 13% higher for people identified as African-American.      Creatinine, Ser  Date Value Ref Range Status  09/20/2023 0.69 0.57 - 1.00 mg/dL Final         Passed - Valid encounter within last 12 months    Recent Outpatient Visits           4 months ago Type 2 diabetes mellitus without complication, without long-term current use of insulin (HCC)   [facility] [place]. [facility] Delbert Clam, MD   7 months ago Type 2 diabetes mellitus with other specified complication, without long-term current use of insulin (HCC)   [facility] [facility] [place] [facility] [place]. [facility] Delbert Clam, MD   9 months ago Encounter for [employer] annual wellness exam   [place] [facility] [place] [facility] [place]. [facility] Delbert Clam, MD   11 months ago Long-term current use of injectable noninsulin antidiabetic medication   [place] [facility] [place]. [facility] Delbert Clam, MD   1 year ago Mass of left axilla   [facility] [place] [facility] [place]. [facility] Delbert Clam, MD

## 2024-05-10 ENCOUNTER — Other Ambulatory Visit: Payer: Self-pay

## 2024-05-10 ENCOUNTER — Telehealth: Payer: Self-pay

## 2024-05-10 NOTE — Telephone Encounter (Signed)
 Copied from CRM #[id number]. Topic: Clinical - Prescription Issue >> May 10, 2024  1:57 PM Donna BRAVO wrote: Reason for CRM: patient calling in asking for a refill update for potassium chloride  (KLOR-CON ) 10 MEQ tablet     Patient has questions if Dr Delbert is taking her off the Potassium or to continue.  Patient would like to speak with nurse. [phone]

## 2024-05-10 NOTE — Telephone Encounter (Addendum)
 Spoke with pharmacy. Patient has refill available with and is aware

## 2024-05-10 NOTE — Telephone Encounter (Signed)
 Spoke with the pharmacy. Patient

## 2024-05-15 ENCOUNTER — Other Ambulatory Visit (HOSPITAL_COMMUNITY): Payer: Self-pay

## 2024-05-15 ENCOUNTER — Other Ambulatory Visit: Payer: Self-pay

## 2024-05-31 ENCOUNTER — Other Ambulatory Visit (HOSPITAL_COMMUNITY): Payer: Self-pay

## 2024-05-31 ENCOUNTER — Other Ambulatory Visit: Payer: Self-pay

## 2024-06-01 ENCOUNTER — Other Ambulatory Visit (HOSPITAL_COMMUNITY): Payer: Self-pay

## 2024-06-01 ENCOUNTER — Other Ambulatory Visit: Payer: Self-pay

## 2024-06-02 ENCOUNTER — Other Ambulatory Visit (HOSPITAL_COMMUNITY): Payer: Self-pay

## 2024-06-02 ENCOUNTER — Other Ambulatory Visit: Payer: Self-pay

## 2024-06-12 ENCOUNTER — Other Ambulatory Visit: Payer: Self-pay

## 2024-06-13 ENCOUNTER — Other Ambulatory Visit: Payer: Self-pay | Admitting: Family Medicine

## 2024-06-13 ENCOUNTER — Telehealth: Payer: Self-pay | Admitting: Family Medicine

## 2024-06-13 DIAGNOSIS — E1159 Type 2 diabetes mellitus with other circulatory complications: Secondary | ICD-10-CM

## 2024-06-13 DIAGNOSIS — E1169 Type 2 diabetes mellitus with other specified complication: Secondary | ICD-10-CM

## 2024-06-13 NOTE — Telephone Encounter (Signed)
 Called pt to reschedule appt. Pt did not answer and LVM for pt to call back so that we can schedule appt.

## 2024-06-22 ENCOUNTER — Ambulatory Visit: Admitting: Family Medicine

## 2024-06-29 ENCOUNTER — Other Ambulatory Visit: Payer: Self-pay | Admitting: Family Medicine

## 2024-06-29 DIAGNOSIS — G8929 Other chronic pain: Secondary | ICD-10-CM

## 2024-07-01 ENCOUNTER — Other Ambulatory Visit (HOSPITAL_COMMUNITY): Payer: Self-pay

## 2024-07-01 MED ORDER — MELOXICAM 7.5 MG PO TABS
7.5000 mg | ORAL_TABLET | Freq: Every day | ORAL | 0 refills | Status: DC
  Filled 2024-07-01: qty 90, 90d supply, fill #0

## 2024-07-01 NOTE — Telephone Encounter (Signed)
 Requested Prescriptions  Pending Prescriptions Disp Refills   meloxicam  (MOBIC ) 7.5 MG tablet 90 tablet 0    Sig: Take 1 tablet by mouth once daily     Analgesics:  COX2 Inhibitors Failed - 07/01/2024  7:45 AM      Failed - Manual Review: Labs are only required if the patient has taken medication for more than 8 weeks.      Failed - HGB in normal range and within 360 days    Hemoglobin  Date Value Ref Range Status  01/28/2023 11.0 (L) 11.1 - 15.9 g/dL Final         Failed - HCT in normal range and within 360 days    Hematocrit  Date Value Ref Range Status  01/28/2023 35.9 34.0 - 46.6 % Final         Passed - Cr in normal range and within 360 days    Creat  Date Value Ref Range Status  09/21/2016 0.54 0.50 - 1.05 mg/dL Final    Comment:      For patients > or = 66 years of age: The upper reference limit for Creatinine is approximately 13% higher for people identified as African-American.      Creatinine, Ser  Date Value Ref Range Status  09/20/2023 0.69 0.57 - 1.00 mg/dL Final         Passed - AST in normal range and within 360 days    AST  Date Value Ref Range Status  09/20/2023 20 0 - 40 IU/L Final         Passed - ALT in normal range and within 360 days    ALT  Date Value Ref Range Status  09/20/2023 20 0 - 32 IU/L Final         Passed - eGFR is 30 or above and within 360 days    GFR, Est African American  Date Value Ref Range Status  09/21/2016 >89 >=60 mL/min Final   GFR calc Af Amer  Date Value Ref Range Status  04/11/2020 108 >59 mL/min/1.73 Final    Comment:    **[facility] currently reports eGFR in compliance with the current**   recommendations of the [employer]. [facility] will   update reporting as new guidelines are published from the NKF-ASN   Task force.    GFR, Est Non African American  Date Value Ref Range Status  09/21/2016 >89 >=60 mL/min Final   GFR calc non Af Amer  Date Value Ref Range Status  04/11/2020 94 >59  mL/min/1.73 Final   eGFR  Date Value Ref Range Status  09/20/2023 96 >59 mL/min/1.73 Final         Passed - Patient is not pregnant      Passed - Valid encounter within last 12 months    Recent Outpatient Visits           6 months ago Type 2 diabetes mellitus without complication, without long-term current use of insulin (HCC)   [facility] [place] Delbert Clam, MD   9 months ago Type 2 diabetes mellitus with other specified complication, without long-term current use of insulin (HCC)   [facility] [place] [facility] [place] [facility] [place]. [facility] Delbert Clam, MD   11 months ago Encounter for [employer] annual wellness exam   [facility] [facility] [place] [facility] Of [place]. [facility] Delbert Clam, MD  1 year ago Long-term current use of injectable noninsulin antidiabetic medication   [place] [facility] [place]. [facility] Delbert Clam, MD   1 year ago Mass of left axilla   [place] [facility] [place] [facility] Of [place]. [facility] Delbert Clam, MD

## 2024-07-04 ENCOUNTER — Other Ambulatory Visit: Payer: Self-pay

## 2024-07-12 ENCOUNTER — Other Ambulatory Visit: Payer: Self-pay | Admitting: Family Medicine

## 2024-07-29 ENCOUNTER — Other Ambulatory Visit: Payer: Self-pay | Admitting: Family Medicine

## 2024-07-29 DIAGNOSIS — E1159 Type 2 diabetes mellitus with other circulatory complications: Secondary | ICD-10-CM

## 2024-08-10 ENCOUNTER — Other Ambulatory Visit: Payer: Self-pay | Admitting: Family Medicine

## 2024-08-10 ENCOUNTER — Other Ambulatory Visit (HOSPITAL_COMMUNITY): Payer: Self-pay

## 2024-08-10 ENCOUNTER — Other Ambulatory Visit: Payer: Self-pay

## 2024-08-10 DIAGNOSIS — E1169 Type 2 diabetes mellitus with other specified complication: Secondary | ICD-10-CM

## 2024-08-10 DIAGNOSIS — Z7985 Long-term (current) use of injectable non-insulin antidiabetic drugs: Secondary | ICD-10-CM

## 2024-08-10 MED ORDER — TIRZEPATIDE 12.5 MG/0.5ML ~~LOC~~ SOAJ
12.5000 mg | SUBCUTANEOUS | 1 refills | Status: AC
  Filled 2024-08-10 – 2024-08-11 (×2): qty 6, 84d supply, fill #0

## 2024-08-11 ENCOUNTER — Other Ambulatory Visit: Payer: Self-pay

## 2024-08-11 ENCOUNTER — Other Ambulatory Visit (HOSPITAL_COMMUNITY): Payer: Self-pay

## 2024-08-14 ENCOUNTER — Encounter: Payer: Self-pay | Admitting: Family Medicine

## 2024-08-14 ENCOUNTER — Ambulatory Visit: Attending: Family Medicine | Admitting: Family Medicine

## 2024-08-14 ENCOUNTER — Other Ambulatory Visit: Payer: Self-pay

## 2024-08-14 VITALS — BP 151/88 | HR 66 | Temp 98.5°F | Resp 16 | Ht 67.0 in | Wt 260.0 lb

## 2024-08-14 DIAGNOSIS — Z794 Long term (current) use of insulin: Secondary | ICD-10-CM

## 2024-08-14 DIAGNOSIS — Z23 Encounter for immunization: Secondary | ICD-10-CM | POA: Diagnosis not present

## 2024-08-14 DIAGNOSIS — E785 Hyperlipidemia, unspecified: Secondary | ICD-10-CM

## 2024-08-14 DIAGNOSIS — M545 Low back pain, unspecified: Secondary | ICD-10-CM

## 2024-08-14 DIAGNOSIS — Z7985 Long-term (current) use of injectable non-insulin antidiabetic drugs: Secondary | ICD-10-CM

## 2024-08-14 DIAGNOSIS — G8929 Other chronic pain: Secondary | ICD-10-CM

## 2024-08-14 DIAGNOSIS — E119 Type 2 diabetes mellitus without complications: Secondary | ICD-10-CM

## 2024-08-14 DIAGNOSIS — E1159 Type 2 diabetes mellitus with other circulatory complications: Secondary | ICD-10-CM

## 2024-08-14 DIAGNOSIS — E1169 Type 2 diabetes mellitus with other specified complication: Secondary | ICD-10-CM | POA: Diagnosis not present

## 2024-08-14 DIAGNOSIS — E876 Hypokalemia: Secondary | ICD-10-CM

## 2024-08-14 DIAGNOSIS — I152 Hypertension secondary to endocrine disorders: Secondary | ICD-10-CM

## 2024-08-14 LAB — POCT GLYCOSYLATED HEMOGLOBIN (HGB A1C): Hemoglobin A1C: 5.9 % — AB (ref 4.0–5.6)

## 2024-08-14 MED ORDER — ATORVASTATIN CALCIUM 40 MG PO TABS
40.0000 mg | ORAL_TABLET | Freq: Every evening | ORAL | 1 refills | Status: DC
  Filled 2024-08-14: qty 90, 90d supply, fill #0

## 2024-08-14 MED ORDER — CARVEDILOL 6.25 MG PO TABS
6.2500 mg | ORAL_TABLET | Freq: Two times a day (BID) | ORAL | 1 refills | Status: DC
  Filled 2024-08-14: qty 180, 90d supply, fill #0

## 2024-08-14 MED ORDER — TIRZEPATIDE 15 MG/0.5ML ~~LOC~~ SOAJ
15.0000 mg | SUBCUTANEOUS | 1 refills | Status: AC
  Filled 2024-08-14: qty 6, 84d supply, fill #0

## 2024-08-14 MED ORDER — VALSARTAN-HYDROCHLOROTHIAZIDE 320-25 MG PO TABS
1.0000 | ORAL_TABLET | Freq: Every day | ORAL | 1 refills | Status: DC
  Filled 2024-08-14: qty 90, 90d supply, fill #0

## 2024-08-14 MED ORDER — METFORMIN HCL 500 MG PO TABS
500.0000 mg | ORAL_TABLET | Freq: Two times a day (BID) | ORAL | 1 refills | Status: AC
  Filled 2024-08-14: qty 180, 90d supply, fill #0

## 2024-08-14 MED ORDER — MELOXICAM 7.5 MG PO TABS
7.5000 mg | ORAL_TABLET | Freq: Every day | ORAL | 1 refills | Status: AC
  Filled 2024-08-14 – 2024-09-25 (×2): qty 90, 90d supply, fill #0

## 2024-08-14 MED ORDER — POTASSIUM CHLORIDE ER 10 MEQ PO TBCR
10.0000 meq | EXTENDED_RELEASE_TABLET | Freq: Every day | ORAL | 1 refills | Status: AC
  Filled 2024-08-14 – 2024-08-22 (×2): qty 90, 90d supply, fill #0

## 2024-08-14 NOTE — Progress Notes (Signed)
 Subjective:  Patient ID: Emily Dickson, female    DOB: [birth year]  Age: 68 y.o. MRN: [id number]  CC: Hypertension and Diabetes     Discussed the use of AI scribe software for clinical note transcription with the patient, who gave verbal consent to proceed.  History of Present Illness Emmalina A Dickson is a 68 year old female with a history of hypertension, hyperlipidemia, chronic rhinitis, right knee osteoarthritis, type 2 DM who presents for medication management and follow-up.  She has not taken her blood pressure medication today. She is on carvedilol  and valsartan /hydrochlorothiazide . Her blood pressure was normal at her last visit in April.  She is on Mounjaro  for weight management and is increasing from 10 mg to 12.5 mg today. Her weight decreased from 292 pounds in January this year to 260 pounds, which she attributes to this medication.  Her diabetes is  also managed with metformin  500 mg twice daily. Her most recent A1c is 5.9, improved from prior values of 6.5 and 7.0.  She has leg swelling treated with furosemide . Elevating her legs helps, and the swelling is improved. She has no pain in the back of her legs.  Her current medications also include aspirin , atorvastatin , and potassium supplements. She no longer uses tizanidine , gabapentin , or B complex vitamins.  She has fasted this morning for blood work to check kidney, liver, and cholesterol levels.  She reports a prior back injury at work and is currently not working while she awaits an evaluation in January to assess her ability to return to work.  She has no trouble with urination or bowel movements and no pain in the back of her legs.    Past Medical History:  Diagnosis Date   Allergy    seasonal   Arthritis    Back pain    Blood transfusion without reported diagnosis    with child birth   Diabetes mellitus without complication (HCC)    High cholesterol    Hypertension     History reviewed. No  pertinent surgical history.  Family History  Problem Relation Age of Onset   Colon cancer Neg Hx    Colon polyps Neg Hx    Esophageal cancer Neg Hx    Stomach cancer Neg Hx    Rectal cancer Neg Hx     Social History   Socioeconomic History   Marital status: Single    Spouse name: Not on file   Number of children: Not on file   Years of education: Not on file   Highest education level: Not on file  Occupational History   Not on file  Tobacco Use   Smoking status: Never   Smokeless tobacco: Never  Vaping Use   Vaping status: Never Used  Substance and Sexual Activity   Alcohol use: No   Drug use: No   Sexual activity: Yes    Partners: Male    Birth control/protection: None  Other Topics Concern   Not on file  Social History Narrative   Not on file   Social Drivers of Health   Financial Resource Strain: Low Risk  (08/14/2024)   Overall Financial Resource Strain (CARDIA)    Difficulty of Paying Living Expenses: Not very hard  Food Insecurity: No Food Insecurity (08/14/2024)   Hunger Vital Sign    Worried About Running Out of Food in the Last Year: Never true    Ran Out of Food in the Last Year: Never true  Transportation Needs: No Transportation Needs (  09/20/2023)   PRAPARE - Administrator, Civil Service (Medical): No    Lack of Transportation (Non-Medical): No  Physical Activity: Patient Declined (09/20/2023)   Exercise Vital Sign    Days of Exercise per Week: Patient declined    Minutes of Exercise per Session: Patient declined  Stress: No Stress Concern Present (08/14/2024)   [employer] of Occupational Health - Occupational Stress Questionnaire    Feeling of Stress: Not at all  Social Connections: Moderately Integrated (09/20/2023)   Social Connection and Isolation Panel    Frequency of Communication with Friends and Family: More than three times a week    Frequency of Social Gatherings with Friends and Family: More than three times a week     Attends Religious Services: 1 to 4 times per year    Active Member of [employer] or Organizations: Yes    Attends Engineer, Structural: More than 4 times per year    Marital Status: Separated    Allergies  Allergen Reactions   Penicillins Rash    Has patient had a PCN reaction causing immediate rash, facial/tongue/throat swelling, SOB or lightheadedness with hypotension: No Has patient had a PCN reaction causing severe rash involving mucus membranes or skin necrosis: No Has patient had a PCN reaction that required hospitalization No Has patient had a PCN reaction occurring within the last 10 years: No If all of the above answers are NO, then may proceed with Cephalosporin use.    Outpatient Medications Prior to Visit  Medication Sig Dispense Refill   Accu-Chek Softclix Lancets lancets Use to check blood sugar three times daily. 100 each 3   aspirin  EC 81 MG tablet Take 1 tablet (81 mg total) by mouth daily. 90 tablet 1   EQ ALL DAY ALLERGY RELIEF 10 MG tablet Take 1 tablet by mouth once daily 90 tablet 0   fluticasone  (FLONASE ) 50 MCG/ACT nasal spray Place 2 sprays into both nostrils daily. 16 g 6   furosemide  (LASIX ) 40 MG tablet Take 1 tablet (40 mg total) by mouth daily. 90 tablet 1   glucose blood (ACCU-CHEK GUIDE TEST) test strip Use to check blood sugar 3 times daily. 100 each 6   Multiple Vitamin (MULTIVITAMIN WITH MINERALS) TABS tablet Take 1 tablet by mouth daily.     nitroGLYCERIN  (NITROSTAT ) 0.4 MG SL tablet DISSOLVE ONE TABLET UNDER THE TONGUE EVERY 5 MINUTES AS NEEDED FOR CHEST PAIN. 25 tablet 0   tirzepatide  (MOUNJARO ) 12.5 MG/0.5ML Pen Inject 12.5 mg into the skin once a week. 6 mL 1   atorvastatin  (LIPITOR) 40 MG tablet TAKE 1 TABLET BY MOUTH ONCE DAILY AT  6  PM 90 tablet 0   carvedilol  (COREG ) 6.25 MG tablet TAKE 1 TABLET BY MOUTH TWICE DAILY WITH A MEAL 180 tablet 0   meloxicam  (MOBIC ) 7.5 MG tablet Take 1 tablet by mouth once daily 90 tablet 0   metFORMIN   (GLUCOPHAGE ) 500 MG tablet Take 1 tablet (500 mg total) by mouth 2 (two) times daily with a meal. 180 tablet 1   potassium chloride  (KLOR-CON ) 10 MEQ tablet Take 1 tablet (10 mEq total) by mouth daily. 90 tablet 1   valsartan -hydrochlorothiazide  (DIOVAN -HCT) 320-25 MG tablet Take 1 tablet by mouth once daily 30 tablet 0   Apple Cider Vinegar 500 MG TABS Take 500 mg by mouth one time only at 6 PM. (Patient not taking: Reported on 08/14/2024)     Blood Glucose Monitoring Suppl (ACCU-CHEK GUIDE) w/Device KIT Use  to check blood sugar three times daily. (Patient not taking: Reported on 08/14/2024) 1 kit 0   Garlic 1000 MG CAPS Take by mouth.     b complex vitamins capsule Take 1 capsule by mouth daily.     gabapentin  (NEURONTIN ) 300 MG capsule Take 1 capsule (300 mg total) by mouth at bedtime as needed. 30 capsule 3   tiZANidine  (ZANAFLEX ) 4 MG tablet Take 1 tablet (4 mg total) by mouth every 8 (eight) hours as needed for muscle spasms. 60 tablet 1   Facility-Administered Medications Prior to Visit  Medication Dose Route Frequency Provider Last Rate Last Admin   0.9 %  sodium chloride  infusion  500 mL Intravenous Continuous Nandigam, Kavitha V, MD         ROS Review of Systems  Constitutional:  Negative for activity change and appetite change.  HENT:  Negative for sinus pressure and sore throat.   Respiratory:  Negative for chest tightness, shortness of breath and wheezing.   Cardiovascular:  Negative for chest pain and palpitations.  Gastrointestinal:  Negative for abdominal distention, abdominal pain and constipation.  Genitourinary: Negative.   Musculoskeletal: Negative.   Psychiatric/Behavioral:  Negative for behavioral problems and dysphoric mood.     Objective:  BP (!) 151/88   Pulse 66   Temp 98.5 F (36.9 C)   Resp 16   Ht 5' 7 (1.702 m)   Wt 260 lb (117.9 kg)   SpO2 98%   BMI 40.72 kg/m      08/14/2024    9:08 AM 08/14/2024    8:47 AM 12/22/2023    9:14 AM  BP/Weight   Systolic BP 151 157 137  Diastolic BP 88 89 82  Wt. (Lbs)  260   BMI  40.72 kg/m2     Wt Readings from Last 3 Encounters:  08/14/24 260 lb (117.9 kg)  12/22/23 279 lb (126.6 kg)  09/20/23 292 lb 9.6 oz (132.7 kg)      Physical Exam Constitutional:      Appearance: She is well-developed. She is obese.  Cardiovascular:     Rate and Rhythm: Normal rate.     Heart sounds: Normal heart sounds. No murmur heard. Pulmonary:     Effort: Pulmonary effort is normal.     Breath sounds: Normal breath sounds. No wheezing or rales.  Chest:     Chest wall: No tenderness.  Abdominal:     General: Bowel sounds are normal. There is no distension.     Palpations: Abdomen is soft. There is no mass.     Tenderness: There is no abdominal tenderness.  Musculoskeletal:        General: Normal range of motion.     Right lower leg: No edema.     Left lower leg: No edema.  Neurological:     Mental Status: She is alert and oriented to person, place, and time.  Psychiatric:        Mood and Affect: Mood normal.        Latest Ref Rng & Units 09/20/2023   10:53 AM 06/01/2023    9:33 AM 02/16/2023   11:52 AM  CMP  Glucose 70 - 99 mg/dL 880  97    BUN 8 - 27 mg/dL 18  13    Creatinine 9.42 - 1.00 mg/dL 9.30  9.29    Sodium 865 - 144 mmol/L 141  142    Potassium 3.5 - 5.2 mmol/L 4.1  4.5  4.0   Chloride 96 -  106 mmol/L 98  101    CO2 20 - 29 mmol/L 29  27    Calcium  8.7 - 10.3 mg/dL 89.5  89.9    Total Protein 6.0 - 8.5 g/dL 7.7  7.0    Total Bilirubin 0.0 - 1.2 mg/dL 0.5  0.4    Alkaline Phos 44 - 121 IU/L 74  75    AST 0 - 40 IU/L 20  21    ALT 0 - 32 IU/L 20  19      Lipid Panel     Component Value Date/Time   CHOL 180 09/20/2023 1053   TRIG 98 09/20/2023 1053   HDL 70 09/20/2023 1053   CHOLHDL 2.7 12/11/2020 1017   CHOLHDL 2.6 03/11/2016 1043   VLDL 13 03/11/2016 1043   LDLCALC 92 09/20/2023 1053    CBC    Component Value Date/Time   WBC 4.8 01/28/2023 1008   WBC 4.2  05/20/2016 1525   RBC 4.30 01/28/2023 1008   RBC 4.42 05/20/2016 1525   HGB 11.0 (L) 01/28/2023 1008   HCT 35.9 01/28/2023 1008   PLT 208 01/28/2023 1008   MCV 84 01/28/2023 1008   MCH 25.6 (L) 01/28/2023 1008   MCH 27.6 05/20/2016 1525   MCHC 30.6 (L) 01/28/2023 1008   MCHC 32.1 05/20/2016 1525   RDW 14.7 01/28/2023 1008   LYMPHSABS 1.9 01/28/2023 1008   MONOABS 0.8 06/18/2015 1815   EOSABS 0.2 01/28/2023 1008   BASOSABS 0.0 01/28/2023 1008    Lab Results  Component Value Date   HGBA1C 5.9 (A) 08/14/2024   Lab Results  Component Value Date   HGBA1C 5.9 (A) 08/14/2024   HGBA1C 6.5 12/22/2023   HGBA1C 7.0 09/20/2023       Assessment & Plan Type 2 diabetes mellitus with associated hyperlipidemia and hypertension Diabetes well-controlled with A1c of 5.9. Hyperlipidemia managed with atorvastatin . Hypertension elevated due to missed doses. - Continue metformin  500 mg twice daily and Mounjaro  12.5. After 4 weeks increase Mounjaro  to 15mg  - Continue atorvastatin  40 mg once daily. - Repeated blood pressure measurement. - Ordered blood tests for kidney, liver, and cholesterol levels. - Ensure medication refills for six months.  Obesity Weight loss of 32 pounds in 1 year with tirzepatide . Transitioning to higher dose for further weight loss. - Start tirzepatide  12.5 mg today, increase to 15 mg on January 8th. - Monitor and report any issues with tirzepatide .  Hypertension associated with Type 2 DM Elevated due to missed doses. Previously normal in April. - Continue carvedilol , valsartan -hydrochlorothiazide , and furosemide . - Reassess blood pressure at next visit, adjust medications if necessary. -Counseled on blood pressure goal of less than 130/80, low-sodium, DASH diet, medication compliance, 150 minutes of moderate intensity exercise per week. Discussed medication compliance, adverse effects.   Chronic low back pain without Sciatica Secondary to work-related injury. On  light duty, receiving workers' compensation. -Currently on Mobic   Hypokalemia Diuretic induced Managed with potassium supplementation. - Continue potassium chloride  10 mEq daily.  General health maintenance Due for flu shot, last eye exam in March. Encounter for vaccine administration - Administered flu shot.       Meds ordered this encounter  Medications   tirzepatide  (MOUNJARO ) 15 MG/0.5ML Pen    Sig: Inject 15 mg into the skin once a week. Starting 09/09/24    Dispense:  6 mL    Refill:  1   atorvastatin  (LIPITOR) 40 MG tablet    Sig: Take 1 tablet (40 mg total) by  mouth every evening at 6PM.    Dispense:  90 tablet    Refill:  1   carvedilol  (COREG ) 6.25 MG tablet    Sig: Take 1 tablet (6.25 mg total) by mouth 2 (two) times daily with a meal.    Dispense:  180 tablet    Refill:  1   meloxicam  (MOBIC ) 7.5 MG tablet    Sig: Take 1 tablet by mouth once daily    Dispense:  90 tablet    Refill:  1   metFORMIN  (GLUCOPHAGE ) 500 MG tablet    Sig: Take 1 tablet (500 mg total) by mouth 2 (two) times daily with a meal.    Dispense:  180 tablet    Refill:  1   potassium chloride  (KLOR-CON ) 10 MEQ tablet    Sig: Take 1 tablet (10 mEq total) by mouth daily.    Dispense:  90 tablet    Refill:  1   valsartan -hydrochlorothiazide  (DIOVAN -HCT) 320-25 MG tablet    Sig: Take 1 tablet by mouth daily.    Dispense:  90 tablet    Refill:  1    Follow-up: Return in about 1 month (around 09/14/2024) for Blood Pressure follow-up with PCP.       Corrina Sabin, MD, FAAFP. [facility] [place], [place] [phone]   08/14/2024, 10:24 AM

## 2024-08-14 NOTE — Patient Instructions (Signed)
 VISIT SUMMARY:  You came in today for medication management and follow-up for your hypertension and diabetes. We discussed your current medications, recent improvements in your health, and made some adjustments to your treatment plan.  YOUR PLAN:  -TYPE 2 DIABETES MELLITUS WITH ASSOCIATED HYPERLIPIDEMIA AND HYPERTENSION: Your diabetes is well-controlled with an A1c of 5.9. Hyperlipidemia, which is high cholesterol, is managed with atorvastatin . Your blood pressure was elevated today because you missed your medication dose. Continue taking metformin  500 mg twice daily and atorvastatin  40 mg once daily. We repeated your blood pressure measurement and ordered blood tests to check your kidney, liver, and cholesterol levels. Make sure to get your medication refills for the next six months.  -OBESITY: You have lost 32 pounds with the help of tirzepatide . We are increasing your dose to 12.5 mg today and will increase it to 15 mg on January 8th to help with further weight loss. Please monitor and report any issues with this medication.  -HYPERTENSION: Your blood pressure was elevated today because you missed your medication dose. Hypertension is high blood pressure. Continue taking carvedilol , valsartan -hydrochlorothiazide , and furosemide . We will reassess your blood pressure at your next visit and adjust your medications if necessary.  -CHRONIC LOW BACK PAIN: Your chronic low back pain is due to a work-related injury. You are currently on light duty and receiving workers' compensation.  -HYPOKALEMIA: Hypokalemia means low potassium levels. Continue taking potassium chloride  10 mEq daily to manage this condition.  -GENERAL HEALTH MAINTENANCE: You are due for a flu shot and your last eye exam was in March. We administered your flu shot today.  INSTRUCTIONS:  Please follow up for a reassessment of your blood pressure at your next visit. Ensure you get your medication refills for the next six months.  Monitor and report any issues with tirzepatide . Continue with your current medications and follow the plan discussed today.

## 2024-08-15 ENCOUNTER — Ambulatory Visit: Payer: Self-pay | Admitting: Family Medicine

## 2024-08-15 LAB — CMP14+EGFR
ALT: 13 IU/L (ref 0–32)
AST: 16 IU/L (ref 0–40)
Albumin: 4.3 g/dL (ref 3.9–4.9)
Alkaline Phosphatase: 83 IU/L (ref 49–135)
BUN/Creatinine Ratio: 21 (ref 12–28)
BUN: 14 mg/dL (ref 8–27)
Bilirubin Total: 0.4 mg/dL (ref 0.0–1.2)
CO2: 28 mmol/L (ref 20–29)
Calcium: 10.8 mg/dL — ABNORMAL HIGH (ref 8.7–10.3)
Chloride: 99 mmol/L (ref 96–106)
Creatinine, Ser: 0.67 mg/dL (ref 0.57–1.00)
Globulin, Total: 3.2 g/dL (ref 1.5–4.5)
Glucose: 94 mg/dL (ref 70–99)
Potassium: 4.3 mmol/L (ref 3.5–5.2)
Sodium: 141 mmol/L (ref 134–144)
Total Protein: 7.5 g/dL (ref 6.0–8.5)
eGFR: 96 mL/min/1.73 (ref >59)

## 2024-08-15 LAB — LP+NON-HDL CHOLESTEROL
Cholesterol, Total: 157 mg/dL (ref 100–199)
HDL: 61 mg/dL (ref >39)
LDL Chol Calc (NIH): 79 mg/dL (ref 0–99)
Total Non-HDL-Chol (LDL+VLDL): 96 mg/dL (ref 0–129)
Triglycerides: 90 mg/dL (ref 0–149)
VLDL Cholesterol Cal: 17 mg/dL (ref 5–40)

## 2024-08-22 ENCOUNTER — Other Ambulatory Visit: Payer: Self-pay

## 2024-08-22 ENCOUNTER — Other Ambulatory Visit (HOSPITAL_COMMUNITY): Payer: Self-pay

## 2024-08-31 ENCOUNTER — Other Ambulatory Visit: Payer: Self-pay | Admitting: Family Medicine

## 2024-08-31 DIAGNOSIS — E1159 Type 2 diabetes mellitus with other circulatory complications: Secondary | ICD-10-CM

## 2024-09-09 ENCOUNTER — Other Ambulatory Visit: Payer: Self-pay | Admitting: Family Medicine

## 2024-09-09 DIAGNOSIS — R600 Localized edema: Secondary | ICD-10-CM

## 2024-09-15 ENCOUNTER — Other Ambulatory Visit: Payer: Self-pay | Admitting: Family Medicine

## 2024-09-15 ENCOUNTER — Telehealth: Payer: Self-pay | Admitting: Family Medicine

## 2024-09-15 DIAGNOSIS — I152 Hypertension secondary to endocrine disorders: Secondary | ICD-10-CM

## 2024-09-15 DIAGNOSIS — E1169 Type 2 diabetes mellitus with other specified complication: Secondary | ICD-10-CM

## 2024-09-15 NOTE — Telephone Encounter (Signed)
 Pt confirmed appt

## 2024-09-18 ENCOUNTER — Encounter: Payer: Self-pay | Admitting: Family Medicine

## 2024-09-18 ENCOUNTER — Ambulatory Visit: Attending: Family Medicine | Admitting: Family Medicine

## 2024-09-18 DIAGNOSIS — Z7985 Long-term (current) use of injectable non-insulin antidiabetic drugs: Secondary | ICD-10-CM | POA: Diagnosis not present

## 2024-09-18 DIAGNOSIS — I10 Essential (primary) hypertension: Secondary | ICD-10-CM | POA: Diagnosis not present

## 2024-09-18 DIAGNOSIS — E1159 Type 2 diabetes mellitus with other circulatory complications: Secondary | ICD-10-CM | POA: Diagnosis not present

## 2024-09-18 DIAGNOSIS — R600 Localized edema: Secondary | ICD-10-CM | POA: Diagnosis not present

## 2024-09-18 MED ORDER — FUROSEMIDE 20 MG PO TABS: 20.0000 mg | ORAL_TABLET | Freq: Every day | ORAL | 1 refills | Status: AC

## 2024-09-18 MED ORDER — VALSARTAN-HYDROCHLOROTHIAZIDE 320-25 MG PO TABS: 1.0000 | ORAL_TABLET | Freq: Every day | ORAL | 1 refills | Status: AC

## 2024-09-18 NOTE — Patient Instructions (Signed)
 VISIT SUMMARY:  During your visit, we reviewed your blood pressure management, diabetes treatment, and other health concerns. Your blood pressure is well-controlled, and we discussed adjustments to your medications to address side effects and ensure effective treatment. We also reviewed your recent lab results and discussed your medication costs and insurance coverage.  YOUR PLAN:  -HYPERTENSION ASSOCIATED WITH TYPE 2 DIABETES MELLITUS: Hypertension, or high blood pressure, is often associated with Type 2 diabetes. Your blood pressure is currently well-controlled at 118/79 mmHg. We have reduced your furosemide  dose to 20 mg daily to help with frequent urination. Please continue your current antihypertensive regimen.  -TYPE 2 DIABETES MELLITUS: Type 2 diabetes is a condition where your body does not use insulin properly. You are currently on Tirzepatide  12.5 mg. Continue this dose until your current supply is finished, then switch to 15 mg. Ensure your insurance coverage is in place and address any pharmacy issues promptly.  -OBESITY: Obesity is a condition characterized by excessive body fat. Continue with your current management plan, which includes medications and lifestyle modifications.  -HYPERCALCEMIA: Hypercalcemia is a condition where there is too much calcium  in your blood. Your calcium  levels are slightly elevated, possibly due to hydrochlorothiazide . We repeated your calcium  level today to monitor any changes. Continue to monitor for any symptoms or further elevation.  -GENERAL HEALTH MAINTENANCE: We discussed the importance of medication adherence and monitoring your chronic conditions. Ensure you have regular follow-up appointments and medication refills. Communicate any issues with medications or insurance promptly.  INSTRUCTIONS:  Please follow up with regular appointments and ensure your medications are refilled on time. If you experience any new symptoms or have issues with your  medications or insurance, contact us  immediately.

## 2024-09-18 NOTE — Progress Notes (Signed)
 "  Subjective:  Patient ID: Emily Dickson, female    DOB: 06/23/1956  Age: 69 y.o. MRN: [id number]  CC: Hypertension (Discuss Mounjaro  medication/)     Discussed the use of AI scribe software for clinical note transcription with the patient, who gave verbal consent to proceed.  History of Present Illness Emily Dickson is a 69 year old female with a history of hypertension, hyperlipidemia, chronic rhinitis, right knee osteoarthritis, type 2 DM who presents for follow-up of her blood pressure management.  She is taking her antihypertensive medication as prescribed. At the last visit she had not taken her medication, which likely contributed to that elevated reading.  She takes furosemide  for leg swelling and reports good control of edema. She wants to reduce the dose from 40 mg to 20 mg because of frequent urination. She previously did well on 20 mg. She elevates her legs when watching TV.  She uses Mounjaro  for diabetes and received another three-month supply of her current dose instead of the intended increase to 15 mg. She has about one and a half packs left and plans to finish them before increasing the dose. Her insurance covers part of the cost and she pays about $45 out-of-pocket.  Recent labs showed slightly elevated calcium . She takes over-the-counter calcium  plus vitamin D  and is also on hydrochlorothiazide , which can raise calcium . She stopped an additional over-the-counter product (Gaelic) to see if this affects her calcium .  She takes Lipitor, which is not thought to affect her calcium .     Past Medical History:  Diagnosis Date   Allergy    seasonal   Arthritis    Back pain    Blood transfusion without reported diagnosis    with child birth   Diabetes mellitus without complication (HCC)    High cholesterol    Hypertension     No past surgical history on file.  Family History  Problem Relation Age of Onset   Colon cancer Neg Hx    Colon polyps Neg Hx     Esophageal cancer Neg Hx    Stomach cancer Neg Hx    Rectal cancer Neg Hx     Social History   Socioeconomic History   Marital status: Single    Spouse name: Not on file   Number of children: Not on file   Years of education: Not on file   Highest education level: Not on file  Occupational History   Not on file  Tobacco Use   Smoking status: Never   Smokeless tobacco: Never  Vaping Use   Vaping status: Never Used  Substance and Sexual Activity   Alcohol use: No   Drug use: No   Sexual activity: Yes    Partners: Male    Birth control/protection: None  Other Topics Concern   Not on file  Social History Narrative   Not on file   Social Drivers of Health   Tobacco Use: Low Risk (09/18/2024)   Patient History    Smoking Tobacco Use: Never    Smokeless Tobacco Use: Never    Passive Exposure: Not on file  Financial Resource Strain: Low Risk (08/14/2024)   Overall Financial Resource Strain (CARDIA)    Difficulty of Paying Living Expenses: Not very hard  Food Insecurity: No Food Insecurity (08/14/2024)   Epic    Worried About Radiation Protection Practitioner of Food in the Last Year: Never true    Ran Out of Food in the Last Year: Never true  Transportation Needs:  No Transportation Needs (09/20/2023)   PRAPARE - Administrator, Civil Service (Medical): No    Lack of Transportation (Non-Medical): No  Physical Activity: Patient Declined (09/20/2023)   Exercise Vital Sign    Days of Exercise per Week: Patient declined    Minutes of Exercise per Session: Patient declined  Stress: No Stress Concern Present (08/14/2024)   [employer] of Occupational Health - Occupational Stress Questionnaire    Feeling of Stress: Not at all  Social Connections: Moderately Integrated (09/20/2023)   Social Connection and Isolation Panel    Frequency of Communication with Friends and Family: More than three times a week    Frequency of Social Gatherings with Friends and Family: More than three times a  week    Attends Religious Services: 1 to 4 times per year    Active Member of Clubs or Organizations: Yes    Attends Banker Meetings: More than 4 times per year    Marital Status: Separated  Depression (PHQ2-9): Low Risk (08/14/2024)   Depression (PHQ2-9)    PHQ-2 Score: 0  Alcohol Screen: Low Risk (08/14/2024)   Alcohol Screen    Last Alcohol Screening Score (AUDIT): 0  Housing: Unknown (08/14/2024)   Epic    Unable to Pay for Housing in the Last Year: No    Number of Times Moved in the Last Year: Not on file    Homeless in the Last Year: No  Utilities: Not At Risk (08/14/2024)   Epic    Threatened with loss of utilities: No  Health Literacy: Adequate Health Literacy (08/14/2024)   B1300 Health Literacy    Frequency of need for help with medical instructions: Rarely    Allergies[1]  Outpatient Medications Prior to Visit  Medication Sig Dispense Refill   Accu-Chek Softclix Lancets lancets Use to check blood sugar three times daily. 100 each 3   aspirin  EC 81 MG tablet Take 1 tablet (81 mg total) by mouth daily. 90 tablet 1   atorvastatin  (LIPITOR) 40 MG tablet TAKE 1 TABLET BY MOUTH ONCE DAILY AT  6  PM 90 tablet 1   carvedilol  (COREG ) 6.25 MG tablet TAKE 1 TABLET BY MOUTH TWICE DAILY WITH A MEAL 180 tablet 1   EQ ALL DAY ALLERGY RELIEF 10 MG tablet Take 1 tablet by mouth once daily 90 tablet 0   fluticasone  (FLONASE ) 50 MCG/ACT nasal spray Place 2 sprays into both nostrils daily. 16 g 6   glucose blood (ACCU-CHEK GUIDE TEST) test strip Use to check blood sugar 3 times daily. 100 each 6   meloxicam  (MOBIC ) 7.5 MG tablet Take 1 tablet by mouth once daily 90 tablet 1   metFORMIN  (GLUCOPHAGE ) 500 MG tablet Take 1 tablet (500 mg total) by mouth 2 (two) times daily with a meal. 180 tablet 1   Multiple Vitamin (MULTIVITAMIN WITH MINERALS) TABS tablet Take 1 tablet by mouth daily.     nitroGLYCERIN  (NITROSTAT ) 0.4 MG SL tablet DISSOLVE ONE TABLET UNDER THE TONGUE EVERY 5  MINUTES AS NEEDED FOR CHEST PAIN. 25 tablet 0   potassium chloride  (KLOR-CON ) 10 MEQ tablet Take 1 tablet (10 mEq total) by mouth daily. 90 tablet 1   tirzepatide  (MOUNJARO ) 12.5 MG/0.5ML Pen Inject 12.5 mg into the skin once a week. 6 mL 1   tirzepatide  (MOUNJARO ) 15 MG/0.5ML Pen Inject 15 mg into the skin once a week. Starting 09/09/24 6 mL 1   furosemide  (LASIX ) 40 MG tablet Take 1 tablet by mouth  once daily 90 tablet 1   valsartan -hydrochlorothiazide  (DIOVAN -HCT) 320-25 MG tablet TAKE 1 TABLET BY MOUTH ONCE DAILY . APPOINTMENT REQUIRED FOR FUTURE REFILLS 30 tablet 0   Apple Cider Vinegar 500 MG TABS Take 500 mg by mouth one time only at 6 PM. (Patient not taking: Reported on 09/18/2024)     Blood Glucose Monitoring Suppl (ACCU-CHEK GUIDE) w/Device KIT Use to check blood sugar three times daily. (Patient not taking: Reported on 09/18/2024) 1 kit 0   Garlic 1000 MG CAPS Take by mouth.     Facility-Administered Medications Prior to Visit  Medication Dose Route Frequency Provider Last Rate Last Admin   0.9 %  sodium chloride  infusion  500 mL Intravenous Continuous Nandigam, Kavitha V, MD         ROS Review of Systems  Constitutional:  Negative for activity change and appetite change.  HENT:  Negative for sinus pressure and sore throat.   Respiratory:  Negative for chest tightness, shortness of breath and wheezing.   Cardiovascular:  Negative for chest pain and palpitations.  Gastrointestinal:  Negative for abdominal distention, abdominal pain and constipation.  Genitourinary: Negative.   Musculoskeletal: Negative.   Psychiatric/Behavioral:  Negative for behavioral problems and dysphoric mood.     Objective:  BP 118/79   Pulse 70   Temp 98.4 F (36.9 C) (Oral)   Ht 5' 7 (1.702 m)   Wt 260 lb (117.9 kg)   SpO2 96%   BMI 40.72 kg/m      09/18/2024    9:11 AM 08/14/2024    9:08 AM 08/14/2024    8:47 AM  BP/Weight  Systolic BP 118 151 157  Diastolic BP 79 88 89  Wt. (Lbs) 260  260   BMI 40.72 kg/m2  40.72 kg/m2      Physical Exam Constitutional:      Appearance: She is well-developed.  Cardiovascular:     Rate and Rhythm: Normal rate.     Heart sounds: Normal heart sounds. No murmur heard. Pulmonary:     Effort: Pulmonary effort is normal.     Breath sounds: Normal breath sounds. No wheezing or rales.  Chest:     Chest wall: No tenderness.  Abdominal:     General: Bowel sounds are normal. There is no distension.     Palpations: Abdomen is soft. There is no mass.     Tenderness: There is no abdominal tenderness.  Musculoskeletal:        General: Normal range of motion.     Right lower leg: No edema.     Left lower leg: No edema.  Neurological:     Mental Status: She is alert and oriented to person, place, and time.  Psychiatric:        Mood and Affect: Mood normal.        Latest Ref Rng & Units 08/14/2024    9:21 AM 09/20/2023   10:53 AM 06/01/2023    9:33 AM  CMP  Glucose 70 - 99 mg/dL 94  880  97   BUN 8 - 27 mg/dL 14  18  13    Creatinine 0.57 - 1.00 mg/dL 9.32  9.30  9.29   Sodium 134 - 144 mmol/L 141  141  142   Potassium 3.5 - 5.2 mmol/L 4.3  4.1  4.5   Chloride 96 - 106 mmol/L 99  98  101   CO2 20 - 29 mmol/L 28  29  27    Calcium  8.7 - 10.3 mg/dL  10.8  10.4  10.0   Total Protein 6.0 - 8.5 g/dL 7.5  7.7  7.0   Total Bilirubin 0.0 - 1.2 mg/dL 0.4  0.5  0.4   Alkaline Phos 49 - 135 IU/L 83  74  75   AST 0 - 40 IU/L 16  20  21    ALT 0 - 32 IU/L 13  20  19      Lipid Panel     Component Value Date/Time   CHOL 157 08/14/2024 0921   TRIG 90 08/14/2024 0921   HDL 61 08/14/2024 0921   CHOLHDL 2.7 12/11/2020 1017   CHOLHDL 2.6 03/11/2016 1043   VLDL 13 03/11/2016 1043   LDLCALC 79 08/14/2024 0921    CBC    Component Value Date/Time   WBC 4.8 01/28/2023 1008   WBC 4.2 05/20/2016 1525   RBC 4.30 01/28/2023 1008   RBC 4.42 05/20/2016 1525   HGB 11.0 (L) 01/28/2023 1008   HCT 35.9 01/28/2023 1008   PLT 208 01/28/2023 1008   MCV 84  01/28/2023 1008   MCH 25.6 (L) 01/28/2023 1008   MCH 27.6 05/20/2016 1525   MCHC 30.6 (L) 01/28/2023 1008   MCHC 32.1 05/20/2016 1525   RDW 14.7 01/28/2023 1008   LYMPHSABS 1.9 01/28/2023 1008   MONOABS 0.8 06/18/2015 1815   EOSABS 0.2 01/28/2023 1008   BASOSABS 0.0 01/28/2023 1008    Lab Results  Component Value Date   HGBA1C 5.9 (A) 08/14/2024       Assessment & Plan Hypertension associated with Type 2 diabetes mellitus Blood pressure controlled at 118/79 mmHg. Furosemide  effective for leg swelling but causes frequent urination. - Continue current antihypertensive regimen.  Edema - Improved - Reduced furosemide  to 20 mg daily per patient request -Encouraged to comply with a low-sodium diet, elevate feet, use compression stockings  Hypercalcemia Slightly elevated calcium  levels, possibly due to hydrochlorothiazide . Requires monitoring. - Repeated calcium  level today to monitor changes. - Continue to monitor for any symptoms or further elevation.  General health maintenance Routine health maintenance discussed, including medication adherence and monitoring of chronic conditions. - Ensure regular follow-up appointments and medication refills. - Encouraged communication regarding any issues with medications or insurance.      Meds ordered this encounter  Medications   furosemide  (LASIX ) 20 MG tablet    Sig: Take 1 tablet (20 mg total) by mouth daily.    Dispense:  90 tablet    Refill:  1   valsartan -hydrochlorothiazide  (DIOVAN -HCT) 320-25 MG tablet    Sig: Take 1 tablet by mouth daily.    Dispense:  90 tablet    Refill:  1    Follow-up: Return in about 5 months (around 02/16/2025) for Chronic medical conditions.       Corrina Sabin, MD, FAAFP. [facility] [place], [place] [phone]   09/18/2024, 10:09 AM    [1]  Allergies Allergen Reactions   Penicillins Rash    Has patient had a PCN reaction causing immediate  rash, facial/tongue/throat swelling, SOB or lightheadedness with hypotension: No Has patient had a PCN reaction causing severe rash involving mucus membranes or skin necrosis: No Has patient had a PCN reaction that required hospitalization No Has patient had a PCN reaction occurring within the last 10 years: No If all of the above answers are NO, then may proceed with Cephalosporin use.   "

## 2024-09-19 LAB — PTH, INTACT AND CALCIUM
Calcium: 10.5 mg/dL — ABNORMAL HIGH (ref 8.7–10.3)
PTH: 29 pg/mL (ref 15–65)

## 2024-09-20 ENCOUNTER — Ambulatory Visit: Payer: Self-pay | Admitting: Family Medicine

## 2024-09-25 ENCOUNTER — Other Ambulatory Visit: Payer: Self-pay

## 2024-09-26 ENCOUNTER — Other Ambulatory Visit (HOSPITAL_COMMUNITY): Payer: Self-pay

## 2025-02-19 ENCOUNTER — Ambulatory Visit: Payer: Self-pay | Admitting: Family Medicine
